# Patient Record
Sex: Male | Born: 1965
Health system: Southern US, Community
[De-identification: ages and names within clinical notes are randomized; demographics above are authoritative.]

## PROBLEM LIST (undated history)

## (undated) DIAGNOSIS — Z9049 Acquired absence of other specified parts of digestive tract: Secondary | ICD-10-CM

## (undated) DIAGNOSIS — J189 Pneumonia, unspecified organism: Secondary | ICD-10-CM

## (undated) DIAGNOSIS — K219 Gastro-esophageal reflux disease without esophagitis: Secondary | ICD-10-CM

## (undated) DIAGNOSIS — K760 Fatty (change of) liver, not elsewhere classified: Secondary | ICD-10-CM

## (undated) DIAGNOSIS — K8020 Calculus of gallbladder without cholecystitis without obstruction: Secondary | ICD-10-CM

## (undated) DIAGNOSIS — I10 Essential (primary) hypertension: Secondary | ICD-10-CM

## (undated) HISTORY — PX: APPENDECTOMY: SHX54

## (undated) HISTORY — PX: COLONOSCOPY: SHX174

## (undated) HISTORY — PX: WISDOM TOOTH EXTRACTION: SHX21

## (undated) HISTORY — PX: FINGER SURGERY: SHX640

---

## 2006-07-22 ENCOUNTER — Ambulatory Visit (HOSPITAL_COMMUNITY): Admission: EM | Admit: 2006-07-22 | Discharge: 2006-07-22 | Payer: Self-pay | Admitting: Emergency Medicine

## 2017-06-08 DIAGNOSIS — J0100 Acute maxillary sinusitis, unspecified: Secondary | ICD-10-CM | POA: Diagnosis not present

## 2017-06-08 DIAGNOSIS — J209 Acute bronchitis, unspecified: Secondary | ICD-10-CM | POA: Diagnosis not present

## 2017-11-28 DIAGNOSIS — K5732 Diverticulitis of large intestine without perforation or abscess without bleeding: Secondary | ICD-10-CM | POA: Diagnosis not present

## 2017-11-30 DIAGNOSIS — Z1211 Encounter for screening for malignant neoplasm of colon: Secondary | ICD-10-CM | POA: Diagnosis not present

## 2017-11-30 DIAGNOSIS — K5730 Diverticulosis of large intestine without perforation or abscess without bleeding: Secondary | ICD-10-CM | POA: Diagnosis not present

## 2017-11-30 DIAGNOSIS — K219 Gastro-esophageal reflux disease without esophagitis: Secondary | ICD-10-CM | POA: Diagnosis not present

## 2018-01-15 ENCOUNTER — Inpatient Hospital Stay (HOSPITAL_COMMUNITY)
Admission: EM | Admit: 2018-01-15 | Discharge: 2018-01-17 | DRG: 392 | Disposition: A | Discharge status: Home / Self Care | Payer: 59 | Attending: Internal Medicine | Admitting: Internal Medicine

## 2018-01-15 ENCOUNTER — Emergency Department (HOSPITAL_COMMUNITY): Payer: 59

## 2018-01-15 ENCOUNTER — Encounter (HOSPITAL_COMMUNITY): Payer: Self-pay | Admitting: Emergency Medicine

## 2018-01-15 ENCOUNTER — Other Ambulatory Visit: Payer: Self-pay

## 2018-01-15 DIAGNOSIS — Z9049 Acquired absence of other specified parts of digestive tract: Secondary | ICD-10-CM

## 2018-01-15 DIAGNOSIS — K760 Fatty (change of) liver, not elsewhere classified: Secondary | ICD-10-CM | POA: Diagnosis present

## 2018-01-15 DIAGNOSIS — R109 Unspecified abdominal pain: Secondary | ICD-10-CM | POA: Diagnosis not present

## 2018-01-15 DIAGNOSIS — J9811 Atelectasis: Secondary | ICD-10-CM | POA: Diagnosis not present

## 2018-01-15 DIAGNOSIS — K5732 Diverticulitis of large intestine without perforation or abscess without bleeding: Principal | ICD-10-CM | POA: Diagnosis present

## 2018-01-15 DIAGNOSIS — Z791 Long term (current) use of non-steroidal anti-inflammatories (NSAID): Secondary | ICD-10-CM | POA: Diagnosis not present

## 2018-01-15 DIAGNOSIS — D72829 Elevated white blood cell count, unspecified: Secondary | ICD-10-CM | POA: Diagnosis present

## 2018-01-15 DIAGNOSIS — K8020 Calculus of gallbladder without cholecystitis without obstruction: Secondary | ICD-10-CM | POA: Diagnosis present

## 2018-01-15 DIAGNOSIS — K5792 Diverticulitis of intestine, part unspecified, without perforation or abscess without bleeding: Secondary | ICD-10-CM

## 2018-01-15 DIAGNOSIS — R140 Abdominal distension (gaseous): Secondary | ICD-10-CM | POA: Diagnosis not present

## 2018-01-15 DIAGNOSIS — K56609 Unspecified intestinal obstruction, unspecified as to partial versus complete obstruction: Secondary | ICD-10-CM

## 2018-01-15 DIAGNOSIS — Z79899 Other long term (current) drug therapy: Secondary | ICD-10-CM

## 2018-01-15 DIAGNOSIS — R1110 Vomiting, unspecified: Secondary | ICD-10-CM | POA: Diagnosis not present

## 2018-01-15 DIAGNOSIS — E871 Hypo-osmolality and hyponatremia: Secondary | ICD-10-CM | POA: Diagnosis not present

## 2018-01-15 HISTORY — DX: Acquired absence of other specified parts of digestive tract: Z90.49

## 2018-01-15 LAB — CBC
HCT: 46.9 % (ref 39.0–52.0)
Hemoglobin: 15.8 g/dL (ref 13.0–17.0)
MCH: 30.6 pg (ref 26.0–34.0)
MCHC: 33.7 g/dL (ref 30.0–36.0)
MCV: 90.9 fL (ref 78.0–100.0)
Platelets: 283 10*3/uL (ref 150–400)
RBC: 5.16 MIL/uL (ref 4.22–5.81)
RDW: 13.3 % (ref 11.5–15.5)
WBC: 17.6 10*3/uL — AB (ref 4.0–10.5)

## 2018-01-15 LAB — URINALYSIS, ROUTINE W REFLEX MICROSCOPIC
BACTERIA UA: NONE SEEN
Bilirubin Urine: NEGATIVE
Glucose, UA: NEGATIVE mg/dL
KETONES UR: 20 mg/dL — AB
LEUKOCYTES UA: NEGATIVE
Nitrite: NEGATIVE
PH: 6 (ref 5.0–8.0)
Protein, ur: NEGATIVE mg/dL
Specific Gravity, Urine: >1.046 — ABNORMAL HIGH (ref 1.005–1.030)

## 2018-01-15 LAB — COMPREHENSIVE METABOLIC PANEL
ALBUMIN: 4 g/dL (ref 3.5–5.0)
ALT: 28 U/L (ref 17–63)
AST: 18 U/L (ref 15–41)
Alkaline Phosphatase: 54 U/L (ref 38–126)
Anion gap: 13 (ref 5–15)
BUN: 21 mg/dL — AB (ref 6–20)
CHLORIDE: 100 mmol/L — AB (ref 101–111)
CO2: 20 mmol/L — AB (ref 22–32)
Calcium: 9.2 mg/dL (ref 8.9–10.3)
Creatinine, Ser: 1.39 mg/dL — ABNORMAL HIGH (ref 0.61–1.24)
GFR calc Af Amer: >60 mL/min (ref >60)
GFR calc non Af Amer: 57 mL/min — ABNORMAL LOW (ref >60)
GLUCOSE: 108 mg/dL — AB (ref 65–99)
Potassium: 4.1 mmol/L (ref 3.5–5.1)
SODIUM: 133 mmol/L — AB (ref 135–145)
Total Bilirubin: 1.3 mg/dL — ABNORMAL HIGH (ref 0.3–1.2)
Total Protein: 7.5 g/dL (ref 6.5–8.1)

## 2018-01-15 LAB — LIPASE, BLOOD: LIPASE: 24 U/L (ref 11–51)

## 2018-01-15 MED ORDER — SODIUM CHLORIDE 0.9 % IV SOLN
INTRAVENOUS | Status: DC
  Administered 2018-01-15: 100 mL/h via INTRAVENOUS
  Administered 2018-01-15: 14:00:00 via INTRAVENOUS
  Administered 2018-01-16: 100 mL/h via INTRAVENOUS
  Administered 2018-01-17: 02:00:00 via INTRAVENOUS

## 2018-01-15 MED ORDER — IOPAMIDOL (ISOVUE-300) INJECTION 61%
100.0000 mL | Freq: Once | INTRAVENOUS | Status: AC | PRN
  Administered 2018-01-15: 100 mL via INTRAVENOUS

## 2018-01-15 MED ORDER — SODIUM CHLORIDE 0.9 % IV BOLUS (SEPSIS)
1000.0000 mL | Freq: Once | INTRAVENOUS | Status: AC
  Administered 2018-01-15: 1000 mL via INTRAVENOUS

## 2018-01-15 MED ORDER — MORPHINE SULFATE (PF) 4 MG/ML IV SOLN
4.0000 mg | Freq: Once | INTRAVENOUS | Status: AC
  Administered 2018-01-15: 4 mg via INTRAVENOUS
  Filled 2018-01-15: qty 1

## 2018-01-15 MED ORDER — ENOXAPARIN SODIUM 40 MG/0.4ML ~~LOC~~ SOLN
40.0000 mg | SUBCUTANEOUS | Status: DC
  Administered 2018-01-15 – 2018-01-16 (×2): 40 mg via SUBCUTANEOUS
  Filled 2018-01-15 (×2): qty 0.4

## 2018-01-15 MED ORDER — CIPROFLOXACIN IN D5W 400 MG/200ML IV SOLN
400.0000 mg | Freq: Once | INTRAVENOUS | Status: AC
  Administered 2018-01-15: 400 mg via INTRAVENOUS
  Filled 2018-01-15: qty 200

## 2018-01-15 MED ORDER — ONDANSETRON HCL 4 MG/2ML IJ SOLN
4.0000 mg | Freq: Three times a day (TID) | INTRAMUSCULAR | Status: DC | PRN
  Administered 2018-01-16: 4 mg via INTRAVENOUS
  Filled 2018-01-15: qty 2

## 2018-01-15 MED ORDER — IOPAMIDOL (ISOVUE-300) INJECTION 61%
INTRAVENOUS | Status: AC
  Filled 2018-01-15: qty 100

## 2018-01-15 MED ORDER — ACETAMINOPHEN 325 MG PO TABS
650.0000 mg | ORAL_TABLET | ORAL | Status: DC | PRN
  Administered 2018-01-15 – 2018-01-17 (×3): 650 mg via ORAL
  Filled 2018-01-15 (×3): qty 2

## 2018-01-15 MED ORDER — ONDANSETRON HCL 4 MG/2ML IJ SOLN
4.0000 mg | Freq: Once | INTRAMUSCULAR | Status: AC
  Administered 2018-01-15: 4 mg via INTRAVENOUS
  Filled 2018-01-15: qty 2

## 2018-01-15 MED ORDER — METRONIDAZOLE IN NACL 5-0.79 MG/ML-% IV SOLN
500.0000 mg | Freq: Three times a day (TID) | INTRAVENOUS | Status: DC
  Administered 2018-01-15 – 2018-01-17 (×6): 500 mg via INTRAVENOUS
  Filled 2018-01-15 (×6): qty 100

## 2018-01-15 MED ORDER — LIP MEDEX EX OINT
TOPICAL_OINTMENT | CUTANEOUS | Status: AC
  Filled 2018-01-15: qty 7

## 2018-01-15 MED ORDER — CIPROFLOXACIN IN D5W 400 MG/200ML IV SOLN
400.0000 mg | Freq: Two times a day (BID) | INTRAVENOUS | Status: DC
  Administered 2018-01-15 – 2018-01-17 (×4): 400 mg via INTRAVENOUS
  Filled 2018-01-15 (×4): qty 200

## 2018-01-15 MED ORDER — ORAL CARE MOUTH RINSE
15.0000 mL | Freq: Two times a day (BID) | OROMUCOSAL | Status: DC
  Administered 2018-01-15 – 2018-01-16 (×2): 15 mL via OROMUCOSAL

## 2018-01-15 MED ORDER — MORPHINE SULFATE (PF) 2 MG/ML IV SOLN
1.0000 mg | INTRAVENOUS | Status: DC | PRN
  Administered 2018-01-15 (×2): 2 mg via INTRAVENOUS
  Filled 2018-01-15 (×2): qty 1

## 2018-01-15 MED ORDER — METRONIDAZOLE IN NACL 5-0.79 MG/ML-% IV SOLN
500.0000 mg | Freq: Once | INTRAVENOUS | Status: AC
  Administered 2018-01-15: 500 mg via INTRAVENOUS
  Filled 2018-01-15: qty 100

## 2018-01-15 NOTE — H&P (Signed)
History and Physical    Henry Paul ZOX:[id number] DOB: 12-Sep-1966 DOA: 01/15/2018  PCP: Patient, No Pcp Per  Patient coming from: Home  I have personally briefly reviewed patient's old medical records in [facility]  Chief Complaint: lower abd pain.   HPI: Henry Paul is a 52 y.o. male with medical history significant of diverticulitis every year, diverticulosis, s/p appendectomy 10 years ago, comes in today for worsening lower abdominal pain, more in the left lower quadrant, associated with nausea, vomiting and diarrhea,. Pt was seen by Dr Loreta Ave yesterday and was given antibiotics for diverticulitis but his symptoms worsened over night and came to ED for further evaluation.  He reports subjective fevers and chills . But denies chest pain , sob, cough, dysuria, headache, dizziness , sensory deficits, syncope.  Lab work in ED REVEALED mild hyponatremia, creatinine os 1.39, BUN OF 21, BICARB OF 20,  total bili of 1.3 and wbc count of 17.6. CT abd and pelvis showed Findings consistent with acute sigmoid diverticulitis. No extraluminal bowel gas. No well-defined abscess. Mild partial small bowel obstruction secondary to the left lower quadrant inflammatory process.  Review of Systems: As per HPI otherwise 10 point review of systems negative.    History reviewed. No pertinent past medical history.  History reviewed. No pertinent surgical history.   reports that  has never smoked. he has never used smokeless tobacco. He reports that he does not drink alcohol or use drugs.  No Known Allergies  Family history reviewed and not pertinent.   Prior to Admission medications   Medication Sig Start Date End Date Taking? Authorizing Provider  acetaminophen (TYLENOL) 500 MG tablet Take 1,000 mg by mouth every 6 (six) hours as needed for headache.   Yes [provider]  ciprofloxacin (CIPRO) 500 MG tablet Take 500 mg by mouth every 12 (twelve) hours. 01/14/18  Yes [provider]  ibuprofen (ADVIL,MOTRIN) 200 MG tablet Take 200 mg by mouth every 6 (six) hours as needed for headache.   Yes [provider]  metroNIDAZOLE (FLAGYL) 250 MG tablet Take 250 mg by mouth 3 (three) times daily. 01/14/18  Yes [provider]    Physical Exam: Vitals:   01/15/18 0750  BP: (!) 143/100  Pulse: (!) 120  Resp: 20  Temp: 98.2 F (36.8 C)  TempSrc: Oral  SpO2: 100%    Constitutional: NAD, calm, comfortable Vitals:   01/15/18 0750  BP: (!) 143/100  Pulse: (!) 120  Resp: 20  Temp: 98.2 F (36.8 C)  TempSrc: Oral  SpO2: 100%   Eyes: PERRL, lids and conjunctivae normal ENMT: Mucous membranes are moist. Posterior pharynx clear of any exudate or lesions.Normal dentition.  Neck: normal, supple, no masses, no thyromegaly Respiratory: clear to auscultation bilaterally, no wheezing, no crackles. Normal respiratory effort. No accessory muscle use.  Cardiovascular: Regular rate and rhythm, no murmurs / rubs / gallops. No extremity edema.  Abdomen: soft, tender in the left lower quadrant, bowel sounds heard.  Musculoskeletal: no clubbing / cyanosis. No joint deformity upper and lower extremities. Good ROM, no contractures. Normal muscle tone.  Skin: no rashes, lesions, ulcers. No induration Neurologic: CN 2-12 grossly intact. Sensation intact, DTR normal. Strength 5/5 in all 4.  Psychiatric: Normal judgment and insight. Alert and oriented x 3. Normal mood.    Labs on Admission: I have personally reviewed following labs and imaging studies  CBC: Recent Labs  Lab 01/15/18 0817  WBC 17.6*  HGB 15.8  HCT  46.9  MCV 90.9  PLT 283   Basic Metabolic Panel: Recent Labs  Lab 01/15/18 0817  NA 133*  K 4.1  CL 100*  CO2 20*  GLUCOSE 108*  BUN 21*  CREATININE 1.39*  CALCIUM 9.2   GFR: CrCl cannot be calculated (Unknown ideal weight.). Liver Function Tests: Recent Labs  Lab 01/15/18 0817  AST 18  ALT 28  ALKPHOS 54  BILITOT 1.3*    PROT 7.5  ALBUMIN 4.0   Recent Labs  Lab 01/15/18 0817  LIPASE 24   No results for input(s): AMMONIA in the last 168 hours. Coagulation Profile: No results for input(s): INR, PROTIME in the last 168 hours. Cardiac Enzymes: No results for input(s): CKTOTAL, CKMB, CKMBINDEX, TROPONINI in the last 168 hours. BNP (last 3 results) No results for input(s): PROBNP in the last 8760 hours. HbA1C: No results for input(s): HGBA1C in the last 72 hours. CBG: No results for input(s): GLUCAP in the last 168 hours. Lipid Profile: No results for input(s): CHOL, HDL, LDLCALC, TRIG, CHOLHDL, LDLDIRECT in the last 72 hours. Thyroid Function Tests: No results for input(s): TSH, T4TOTAL, FREET4, T3FREE, THYROIDAB in the last 72 hours. Anemia Panel: No results for input(s): VITAMINB12, FOLATE, FERRITIN, TIBC, IRON, RETICCTPCT in the last 72 hours. Urine analysis:    Component Value Date/Time   COLORURINE YELLOW 01/15/2018 1021   APPEARANCEUR CLEAR 01/15/2018 1021   LABSPEC >1.046 (H) 01/15/2018 1021   PHURINE 6.0 01/15/2018 1021   GLUCOSEU NEGATIVE 01/15/2018 1021   HGBUR SMALL (A) 01/15/2018 1021   BILIRUBINUR NEGATIVE 01/15/2018 1021   KETONESUR 20 (A) 01/15/2018 1021   PROTEINUR NEGATIVE 01/15/2018 1021   NITRITE NEGATIVE 01/15/2018 1021   LEUKOCYTESUR NEGATIVE 01/15/2018 1021    Radiological Exams on Admission: Ct Abdomen Pelvis W Contrast  Result Date: 01/15/2018 CLINICAL DATA:  Abdominal pain. Nausea. Vomiting. Symptoms for 2 day. EXAM: CT ABDOMEN AND PELVIS WITH CONTRAST TECHNIQUE: Multidetector CT imaging of the abdomen and pelvis was performed using the standard protocol following bolus administration of intravenous contrast. CONTRAST:  100mL ISOVUE-300 IOPAMIDOL (ISOVUE-300) INJECTION 61% COMPARISON:  None. FINDINGS: Lower chest: Patchy and linear opacities in the dependent lower lungs bilaterally are nonspecific. Hepatobiliary: Diffuse hepatic steatosis. A calcified gallstone is  present. It measures 12 mm in caliber. Pancreas: Unremarkable Spleen: Unremarkable Adrenals/Urinary Tract: Simple cysts in both kidneys. Adrenal glands are within normal limits. Bladder is decompressed. Stomach/Bowel: There is wall thickening and inflammatory change involving the region of the sigmoid colon. There is stranding in the adjacent fat. Diverticulosis of the descending and sigmoid colon is present. There is secondary inflammation of lower abdominal small bowel loops. There is ill-defined fluid density between small bowel loops in the left side of the abdomen on image 50 of series 2, but there is no extraluminal bowel gas or well-defined abscess. Post appendectomy staples. Mildly prominent mid and upper small bowel loops measure up to 3.1 cm in caliber. There may be an element of partial small bowel obstruction secondary to the left lower quadrant inflammatory process. Stomach and duodenum are within normal limits. Vascular/Lymphatic: No evidence of aortic aneurysm. No abnormal retroperitoneal adenopathy. Reproductive: Borderline prostate enlargement. Other: Noncontributory Musculoskeletal: No vertebral compression deformity. Mild degenerative disc disease in the lower lumbar spine. IMPRESSION: Findings consistent with acute sigmoid diverticulitis. No extraluminal bowel gas. No well-defined abscess. Mild partial small bowel obstruction secondary to the left lower quadrant inflammatory process. Cholelithiasis. Diffuse hepatic steatosis. Bibasilar atelectasis favored over patchy airspace disease. Electronically Signed  By: Jolaine Click M.D.   On: 01/15/2018 10:02    EKG: not done.   Assessment/Plan Active Problems:   Acute diverticulitis   Acute sigmoid diverticulitis  With partial SBO - admit for IV antibiotics, IV pain control, IV fluids. IV anti emetics . NPO with ice chips.  - please call surgery if symptoms dont improve in the next 24 hours.  - afebrile and leukocytosis of 17,600.  -  follows up with Dr Loreta Ave as outpatient.   DVT prophylaxis: lovenox.  Code Status: full code.  Family Communication: none at bedside.  Disposition Plan: pending resolution of the lower abd pain. Consults called: none.  Admission status: inpatient/ medsurg.    Kathlen Mody MD [facility] Pager [phone]  If 7PM-7AM, please contact night-coverage [email or web address] Password [email or web address]  01/15/2018, 11:06 AM

## 2018-01-15 NOTE — ED Provider Notes (Addendum)
[facility]-EMERGENCY DEPT Provider Note   CSN: [id number] Arrival date & time: 01/15/18  0736     History   Chief Complaint Chief Complaint  Patient presents with  . Abdominal Pain  . Nausea  . Emesis    HPI Henry Paul is a 52 y.o. male.  Generalized abdominal pain since Thursday now settling in the left lower quadrant greater than right lower quadrant.  Patient has a history of diverticulitis and appendectomy.  Decreased oral intake.  No dysuria, hematuria.  His gastroenterologist prescribed Flagyl and Cipro recently.  Severity of pain is moderate.  Last bowel movement within the past 24 hours.  No blood in the stool.  Severity of symptoms is moderate.  Palpation makes pain worse.      History reviewed. No pertinent past medical history.  There are no active problems to display for this patient.   History reviewed. No pertinent surgical history.     Home Medications    Prior to Admission medications   Medication Sig Start Date End Date Taking? Authorizing Provider  acetaminophen (TYLENOL) 500 MG tablet Take 1,000 mg by mouth every 6 (six) hours as needed for headache.   Yes [provider]  ciprofloxacin (CIPRO) 500 MG tablet Take 500 mg by mouth every 12 (twelve) hours. 01/14/18  Yes [provider]  ibuprofen (ADVIL,MOTRIN) 200 MG tablet Take 200 mg by mouth every 6 (six) hours as needed for headache.   Yes [provider]  metroNIDAZOLE (FLAGYL) 250 MG tablet Take 250 mg by mouth 3 (three) times daily. 01/14/18  Yes [provider]    Family History No family history on file.  Social History Social History   Tobacco Use  . Smoking status: Never Smoker  . Smokeless tobacco: Never Used  Substance Use Topics  . Alcohol use: No    Frequency: Never  . Drug use: No     Allergies   Patient has no known allergies.   Review of Systems Review of Systems  All other systems reviewed and are  negative.    Physical Exam Updated Vital Signs BP (!) 143/100 (BP Location: Right Arm)   Pulse (!) 120   Temp 98.2 F (36.8 C) (Oral)   Resp 20   SpO2 100%   Physical Exam  Constitutional: He is oriented to person, place, and time. He appears well-developed and well-nourished.  HENT:  Head: Normocephalic and atraumatic.  Eyes: Conjunctivae are normal.  Neck: Neck supple.  Cardiovascular: Normal rate and regular rhythm.  Pulmonary/Chest: Effort normal and breath sounds normal.  Abdominal: Soft. Bowel sounds are normal.  Left lower quadrant greater than right lower quadrant tenderness  Musculoskeletal: Normal range of motion.  Neurological: He is alert and oriented to person, place, and time.  Skin: Skin is warm and dry.  Psychiatric: He has a normal mood and affect. His behavior is normal.  Nursing note and vitals reviewed.    ED Treatments / Results  Labs (all labs ordered are listed, but only abnormal results are displayed) Labs Reviewed  COMPREHENSIVE METABOLIC PANEL - Abnormal; Notable for the following components:      Result Value   Sodium 133 (*)    Chloride 100 (*)    CO2 20 (*)    Glucose, Bld 108 (*)    BUN 21 (*)    Creatinine, Ser 1.39 (*)    Total Bilirubin 1.3 (*)    GFR calc non Af Amer 57 (*)  All other components within normal limits  CBC - Abnormal; Notable for the following components:   WBC 17.6 (*)    All other components within normal limits  LIPASE, BLOOD  URINALYSIS, ROUTINE W REFLEX MICROSCOPIC    EKG  EKG Interpretation None       Radiology Ct Abdomen Pelvis W Contrast  Result Date: 01/15/2018 CLINICAL DATA:  Abdominal pain. Nausea. Vomiting. Symptoms for 2 day. EXAM: CT ABDOMEN AND PELVIS WITH CONTRAST TECHNIQUE: Multidetector CT imaging of the abdomen and pelvis was performed using the standard protocol following bolus administration of intravenous contrast. CONTRAST:  100mL ISOVUE-300 IOPAMIDOL (ISOVUE-300) INJECTION 61%  COMPARISON:  None. FINDINGS: Lower chest: Patchy and linear opacities in the dependent lower lungs bilaterally are nonspecific. Hepatobiliary: Diffuse hepatic steatosis. A calcified gallstone is present. It measures 12 mm in caliber. Pancreas: Unremarkable Spleen: Unremarkable Adrenals/Urinary Tract: Simple cysts in both kidneys. Adrenal glands are within normal limits. Bladder is decompressed. Stomach/Bowel: There is wall thickening and inflammatory change involving the region of the sigmoid colon. There is stranding in the adjacent fat. Diverticulosis of the descending and sigmoid colon is present. There is secondary inflammation of lower abdominal small bowel loops. There is ill-defined fluid density between small bowel loops in the left side of the abdomen on image 50 of series 2, but there is no extraluminal bowel gas or well-defined abscess. Post appendectomy staples. Mildly prominent mid and upper small bowel loops measure up to 3.1 cm in caliber. There may be an element of partial small bowel obstruction secondary to the left lower quadrant inflammatory process. Stomach and duodenum are within normal limits. Vascular/Lymphatic: No evidence of aortic aneurysm. No abnormal retroperitoneal adenopathy. Reproductive: Borderline prostate enlargement. Other: Noncontributory Musculoskeletal: No vertebral compression deformity. Mild degenerative disc disease in the lower lumbar spine. IMPRESSION: Findings consistent with acute sigmoid diverticulitis. No extraluminal bowel gas. No well-defined abscess. Mild partial small bowel obstruction secondary to the left lower quadrant inflammatory process. Cholelithiasis. Diffuse hepatic steatosis. Bibasilar atelectasis favored over patchy airspace disease. Electronically Signed   By: Jolaine ClickArthur  Hoss M.D.   On: 01/15/2018 10:02    Procedures Procedures (including critical care time)  Medications Ordered in ED Medications  iopamidol (ISOVUE-300) 61 % injection (not  administered)  sodium chloride 0.9 % bolus 1,000 mL (not administered)  ciprofloxacin (CIPRO) IVPB 400 mg (not administered)  metroNIDAZOLE (FLAGYL) IVPB 500 mg (not administered)  sodium chloride 0.9 % bolus 1,000 mL (1,000 mLs Intravenous New Bag/Given 01/15/18 0833)  ondansetron (ZOFRAN) injection 4 mg (4 mg Intravenous Given 01/15/18 0833)  morphine 4 MG/ML injection 4 mg (4 mg Intravenous Given 01/15/18 0833)  iopamidol (ISOVUE-300) 61 % injection 100 mL (100 mLs Intravenous Contrast Given 01/15/18 0944)     Initial Impression / Assessment and Plan / ED Course  I have reviewed the triage vital signs and the nursing notes.  Pertinent labs & imaging results that were available during my care of the patient were reviewed by me and considered in my medical decision making (see chart for details).     Patient presents with lower abdominal pain left greater than right.  White count 17 K.  Rx IV fluid, pain meds.  CT abdomen pelvis pending.   CT scan reveals acute sigmoid diverticulitis and mild partial small bowel obstruction.  IV Cipro, IV Flagyl.  Admit to general medicine. Final Clinical Impressions(s) / ED Diagnoses   Final diagnoses:  Diverticulitis  SBO (small bowel obstruction) [facility](HCC)    ED Discharge Orders  None       Donnetta Hutching, MD 01/15/18 0981    Donnetta Hutching, MD 01/15/18 [phone]

## 2018-01-15 NOTE — ED Notes (Signed)
I have just called report to Armando ReichertMarian, RN on [address] and will transport shortly.

## 2018-01-15 NOTE — ED Triage Notes (Signed)
Patient here from home with complaints of abdominal pain, nausea, vomiting x2 days. Saw GI, diagnosed with diverticulitis, prescribed Flagyl and Cipro no relief.

## 2018-01-15 NOTE — ED Notes (Signed)
ED TO INPATIENT HANDOFF REPORT  Name/Age/Gender Henry Paul 52 y.o. male  Code Status Code Status History    This patient does not have a recorded code status. Please follow your organizational policy for patients in this situation.      Home/SNF/Other Home  Chief Complaint stomach hurts  Level of Care/Admitting Diagnosis ED Disposition    ED Disposition Condition [facility] Area: [place] [[id number]]  Level of Care: Med-Surg [16]  Diagnosis: Acute diverticulitis [[id number]]  Admitting Physician: Hosie Poisson [[id number]]  Attending Physician: Hosie Poisson [[id number]]  Estimated length of stay: past midnight tomorrow  Certification:: I certify this patient will need inpatient services for at least 2 midnights  PT Class (Do Not Modify): Inpatient [101]  PT Acc Code (Do Not Modify): Private [1]       Medical History History reviewed. No pertinent past medical history.  Allergies No Known Allergies  IV Location/Drains/Wounds Patient Lines/Drains/Airways Status   Active Line/Drains/Airways    Name:   Placement date:   Placement time:   Site:   Days:   Peripheral IV 01/15/18 Left;Lateral;Distal Forearm   01/15/18    0816    Forearm   less than 1          Labs/Imaging Results for orders placed or performed during the hospital encounter of 01/15/18 (from the past 48 hour(s))  Lipase, blood     Status: None   Collection Time: 01/15/18  8:17 AM  Result Value Ref Range   Lipase 24 11 - 51 U/L    Comment: Performed at [facility], [place] [address], [place], [place] [address]  Comprehensive metabolic panel     Status: Abnormal   Collection Time: 01/15/18  8:17 AM  Result Value Ref Range   Sodium 133 (L) 135 - 145 mmol/L   Potassium 4.1 3.5 - 5.1 mmol/L   Chloride 100 (L) 101 - 111 mmol/L   CO2 20 (L) 22 - 32 mmol/L   Glucose, Bld 108 (H) 65 - 99 mg/dL   BUN 21 (H) 6 - 20 mg/dL   Creatinine, Ser 1.39 (H) 0.61 - 1.24  mg/dL   Calcium 9.2 8.9 - 10.3 mg/dL   Total Protein 7.5 6.5 - 8.1 g/dL   Albumin 4.0 3.5 - 5.0 g/dL   AST 18 15 - 41 U/L   ALT 28 17 - 63 U/L   Alkaline Phosphatase 54 38 - 126 U/L   Total Bilirubin 1.3 (H) 0.3 - 1.2 mg/dL   GFR calc non Af Amer 57 (L) >60 mL/min   GFR calc Af Amer >60 >60 mL/min    Comment: (NOTE) The eGFR has been calculated using the CKD EPI equation. This calculation has not been validated in all clinical situations. eGFR's persistently <60 mL/min signify possible Chronic Kidney Disease.    Anion gap 13 5 - 15    Comment: Performed at [facility], [place] [address], [place], [place] [address]  CBC     Status: Abnormal   Collection Time: 01/15/18  8:17 AM  Result Value Ref Range   WBC 17.6 (H) 4.0 - 10.5 K/uL   RBC 5.16 4.22 - 5.81 MIL/uL   Hemoglobin 15.8 13.0 - 17.0 g/dL   HCT 46.9 39.0 - 52.0 %   MCV 90.9 78.0 - 100.0 fL   MCH 30.6 26.0 - 34.0 pg   MCHC 33.7 30.0 - 36.0 g/dL   RDW 13.3 11.5 - 15.5 %  Platelets 283 150 - 400 K/uL    Comment: Performed at [facility], [place] [address] [place], [place] [address]   Ct Abdomen Pelvis W Contrast  Result Date: 01/15/2018 CLINICAL DATA:  Abdominal pain. Nausea. Vomiting. Symptoms for 2 day. EXAM: CT ABDOMEN AND PELVIS WITH CONTRAST TECHNIQUE: Multidetector CT imaging of the abdomen and pelvis was performed using the standard protocol following bolus administration of intravenous contrast. CONTRAST:  14m ISOVUE-300 IOPAMIDOL (ISOVUE-300) INJECTION 61% COMPARISON:  None. FINDINGS: Lower chest: Patchy and linear opacities in the dependent lower lungs bilaterally are nonspecific. Hepatobiliary: Diffuse hepatic steatosis. A calcified gallstone is present. It measures 12 mm in caliber. Pancreas: Unremarkable Spleen: Unremarkable Adrenals/Urinary Tract: Simple cysts in both kidneys. Adrenal glands are within normal limits. Bladder is decompressed. Stomach/Bowel: There is wall  thickening and inflammatory change involving the region of the sigmoid colon. There is stranding in the adjacent fat. Diverticulosis of the descending and sigmoid colon is present. There is secondary inflammation of lower abdominal small bowel loops. There is ill-defined fluid density between small bowel loops in the left side of the abdomen on image 50 of series 2, but there is no extraluminal bowel gas or well-defined abscess. Post appendectomy staples. Mildly prominent mid and upper small bowel loops measure up to 3.1 cm in caliber. There may be an element of partial small bowel obstruction secondary to the left lower quadrant inflammatory process. Stomach and duodenum are within normal limits. Vascular/Lymphatic: No evidence of aortic aneurysm. No abnormal retroperitoneal adenopathy. Reproductive: Borderline prostate enlargement. Other: Noncontributory Musculoskeletal: No vertebral compression deformity. Mild degenerative disc disease in the lower lumbar spine. IMPRESSION: Findings consistent with acute sigmoid diverticulitis. No extraluminal bowel gas. No well-defined abscess. Mild partial small bowel obstruction secondary to the left lower quadrant inflammatory process. Cholelithiasis. Diffuse hepatic steatosis. Bibasilar atelectasis favored over patchy airspace disease. Electronically Signed   By: AMarybelle KillingsM.D.   On: 01/15/2018 10:02    Pending Labs Unresulted Labs (From admission, onward)   Start     Ordered   01/15/18 0754  Urinalysis, Routine w reflex microscopic  STAT,   STAT     01/15/18 0754      Vitals/Pain Today's Vitals   01/15/18 0750 01/15/18 0830 01/15/18 0900  BP: (!) 143/100    Pulse: (!) 120    Resp: 20    Temp: 98.2 F (36.8 C)    TempSrc: Oral    SpO2: 100%    PainSc: 10-Worst pain ever 6  0-No pain    Isolation Precautions No active isolations  Medications Medications  iopamidol (ISOVUE-300) 61 % injection (not administered)  ciprofloxacin (CIPRO) IVPB 400  mg (not administered)  metroNIDAZOLE (FLAGYL) IVPB 500 mg (500 mg Intravenous New Bag/Given 01/15/18 1021)  sodium chloride 0.9 % bolus 1,000 mL (1,000 mLs Intravenous New Bag/Given 01/15/18 0833)  ondansetron (ZOFRAN) injection 4 mg (4 mg Intravenous Given 01/15/18 0833)  morphine 4 MG/ML injection 4 mg (4 mg Intravenous Given 01/15/18 0833)  iopamidol (ISOVUE-300) 61 % injection 100 mL (100 mLs Intravenous Contrast Given 01/15/18 0944)  sodium chloride 0.9 % bolus 1,000 mL (1,000 mLs Intravenous New Bag/Given 01/15/18 1021)    Mobility walks

## 2018-01-16 ENCOUNTER — Inpatient Hospital Stay (HOSPITAL_COMMUNITY): Payer: 59

## 2018-01-16 LAB — HIV ANTIBODY (ROUTINE TESTING W REFLEX): HIV Screen 4th Generation wRfx: NONREACTIVE

## 2018-01-16 NOTE — Progress Notes (Signed)
@IPLOG @        PROGRESS NOTE                                                                                                                                                                                                             Patient Demographics:    Henry Paul, is a 52 y.o. male, DOB - 10-29-1966, ONG:[id number]  Admit date - 01/15/2018   Admitting Physician Kathlen Mody, MD  Outpatient Primary MD for the patient is Patient, No Pcp Per  LOS - 1  Chief Complaint  Patient presents with  . Abdominal Pain  . Nausea  . Emesis       Brief Narrative  Henry Paul is a 52 y.o. male with medical history significant of diverticulitis every year, diverticulosis, s/p appendectomy 10 years ago, comes in today for worsening lower abdominal pain, more in the left lower quadrant, associated with nausea, vomiting and diarrhea,. Pt was found to have acute sigmoid diverticulitis which seems to be a regular problem for him.   Subjective:    Henry Paul today has, No headache, No chest pain, mild lower abdominal pain - No Nausea, No new weakness tingling or numbness, No Cough - SOB.     Assessment  & Plan :   1.  Acute sigmoid diverticulitis.  The apparent.  Is being treated conservatively, no signs of abscess, certainly no obstruction at this time, continue bowel rest, IV fluids, IV Cipro Flagyl.  Clinically much better.  Likely discharge in the next 1-2 days with outpatient follow-up with Dr. Loreta Ave his gastroenterologist.    Diet : Diet NPO time specified Except for: Ice Chips, Sips with Meds, Other (See Comments)    Family Communication  :  None  Code Status :  Full  Disposition Plan  :  Likely home in 1-2 days  Consults  :  None  Procedures  :    CT -  Findings consistent with acute sigmoid diverticulitis. No extraluminal bowel gas. No well-defined abscess. Mild partial small bowel obstruction secondary to the left lower quadrant inflammatory process. Cholelithiasis.  Diffuse hepatic steatosis. Bibasilar atelectasis favored over patchy airspace disease.  DVT Prophylaxis  :  Lovenox   Lab Results  Component Value Date   PLT 283 01/15/2018    Inpatient Medications  Scheduled Meds: . enoxaparin (LOVENOX) injection  40 mg Subcutaneous Q24H  . mouth rinse  15 mL Mouth Rinse BID   Continuous Infusions: . sodium chloride 100 mL/hr (01/16/18 0134)  . ciprofloxacin Stopped (01/15/18 2220)  .  metronidazole Stopped (01/16/18 0223)   PRN Meds:.acetaminophen, morphine injection, ondansetron (ZOFRAN) IV  Antibiotics  :    Anti-infectives (From admission, onward)   Start     Dose/Rate Route Frequency Ordered Stop   01/15/18 2200  ciprofloxacin (CIPRO) IVPB 400 mg     400 mg 200 mL/hr over 60 Minutes Intravenous Every 12 hours 01/15/18 1143     01/15/18 1800  metroNIDAZOLE (FLAGYL) IVPB 500 mg     500 mg 100 mL/hr over 60 Minutes Intravenous Every 8 hours 01/15/18 1143     01/15/18 1015  ciprofloxacin (CIPRO) IVPB 400 mg     400 mg 200 mL/hr over 60 Minutes Intravenous  Once 01/15/18 1007 01/15/18 1303   01/15/18 1015  metroNIDAZOLE (FLAGYL) IVPB 500 mg     500 mg 100 mL/hr over 60 Minutes Intravenous  Once 01/15/18 1007 01/15/18 1110         Objective:   Vitals:   01/15/18 1321 01/15/18 1347 01/15/18 2147 01/16/18 0546  BP:  (!) 131/91 123/84 134/82  Pulse:  97 89 87  Resp:  16 17 17   Temp:  (!) 100.4 F (38 C) 98.9 F (37.2 C) 98.4 F (36.9 C)  TempSrc:  Oral Oral Oral  SpO2:  97% 98% 98%  Weight: 84.5 kg (186 lb 4.6 oz)     Height: 5\' 9"  (1.753 m)       Wt Readings from Last 3 Encounters:  01/15/18 84.5 kg (186 lb 4.6 oz)     Intake/Output Summary (Last 24 hours) at 01/16/2018 0950 Last data filed at 01/16/2018 0600 Gross per 24 hour  Intake 2285 ml  Output 700 ml  Net 1585 ml     Physical Exam  Awake Alert, Oriented X 3, No new F.N deficits, Normal affect [place].AT,PERRAL Supple Neck,No JVD, No cervical lymphadenopathy  appriciated.  Symmetrical Chest wall movement, Good air movement bilaterally, CTAB RRR,No Gallops,Rubs or new Murmurs, No Parasternal Heave +ve B.Sounds, Abd Soft, mild lower quadrant tenderness, No organomegaly appriciated, No rebound - guarding or rigidity. No Cyanosis, Clubbing or edema, No new Rash or bruise       Data Review:    CBC Recent Labs  Lab 01/15/18 0817  WBC 17.6*  HGB 15.8  HCT 46.9  PLT 283  MCV 90.9  MCH 30.6  MCHC 33.7  RDW 13.3    Chemistries  Recent Labs  Lab 01/15/18 0817  NA 133*  K 4.1  CL 100*  CO2 20*  GLUCOSE 108*  BUN 21*  CREATININE 1.39*  CALCIUM 9.2  AST 18  ALT 28  ALKPHOS 54  BILITOT 1.3*   ------------------------------------------------------------------------------------------------------------------ No results for input(s): CHOL, HDL, LDLCALC, TRIG, CHOLHDL, LDLDIRECT in the last 72 hours.  No results found for: HGBA1C ------------------------------------------------------------------------------------------------------------------ No results for input(s): TSH, T4TOTAL, T3FREE, THYROIDAB in the last 72 hours.  Invalid input(s): FREET3 ------------------------------------------------------------------------------------------------------------------ No results for input(s): VITAMINB12, FOLATE, FERRITIN, TIBC, IRON, RETICCTPCT in the last 72 hours.  Coagulation profile No results for input(s): INR, PROTIME in the last 168 hours.  No results for input(s): DDIMER in the last 72 hours.  Cardiac Enzymes No results for input(s): CKMB, TROPONINI, MYOGLOBIN in the last 168 hours.  Invalid input(s): CK ------------------------------------------------------------------------------------------------------------------ No results found for: BNP  Micro Results No results found for this or any previous visit (from the past 240 hour(s)).  Radiology Reports Ct Abdomen Pelvis W Contrast  Result Date: 01/15/2018 CLINICAL DATA:   Abdominal pain. Nausea. Vomiting. Symptoms for 2 day.  EXAM: CT ABDOMEN AND PELVIS WITH CONTRAST TECHNIQUE: Multidetector CT imaging of the abdomen and pelvis was performed using the standard protocol following bolus administration of intravenous contrast. CONTRAST:  ISOVUE-300 IOPAMIDOL (ISOVUE-300) INJECTION 61% COMPARISON:  None. FINDINGS: Lower chest: Patchy and linear opacities in the dependent lower lungs bilaterally are nonspecific. Hepatobiliary: Diffuse hepatic steatosis. A calcified gallstone is present. It measures 12 mm in caliber. Pancreas: Unremarkable Spleen: Unremarkable Adrenals/Urinary Tract: Simple cysts in both kidneys. Adrenal glands are within normal limits. Bladder is decompressed. Stomach/Bowel: There is wall thickening and inflammatory change involving the region of the sigmoid colon. There is stranding in the adjacent fat. Diverticulosis of the descending and sigmoid colon is present. There is secondary inflammation of lower abdominal small bowel loops. There is ill-defined fluid density between small bowel loops in the left side of the abdomen on image 50 of series 2, but there is no extraluminal bowel gas or well-defined abscess. Post appendectomy staples. Mildly prominent mid and upper small bowel loops measure up to 3.1 cm in caliber. There may be an element of partial small bowel obstruction secondary to the left lower quadrant inflammatory process. Stomach and duodenum are within normal limits. Vascular/Lymphatic: No evidence of aortic aneurysm. No abnormal retroperitoneal adenopathy. Reproductive: Borderline prostate enlargement. Other: Noncontributory Musculoskeletal: No vertebral compression deformity. Mild degenerative disc disease in the lower lumbar spine. IMPRESSION: Findings consistent with acute sigmoid diverticulitis. No extraluminal bowel gas. No well-defined abscess. Mild partial small bowel obstruction secondary to the left lower quadrant inflammatory process.  Cholelithiasis. Diffuse hepatic steatosis. Bibasilar atelectasis favored over patchy airspace disease. Electronically Signed   By: Jolaine Click M.D.   On: 01/15/2018 10:02   Dg Abd Portable 1v  Result Date: 01/16/2018 CLINICAL DATA:  Patient with abdominal distension. EXAM: PORTABLE ABDOMEN - 1 VIEW COMPARISON:  CT abdomen pelvis 01/15/2018. FINDINGS: Gas is demonstrated within nondilated loops of large and small bowel within the abdomen. Supine evaluation limited for the detection of free intraperitoneal air. Osseous structures are unremarkable. IMPRESSION: Nonobstructed bowel gas pattern. Electronically Signed   By: Annia Belt M.D.   On: 01/16/2018 08:21    Time Spent in minutes  30   Susa Raring M.D on 01/16/2018 at 9:50 AM  Between 7am to 7pm - Pager - [phone] ( page via [email or web address], text pages only, please mention full 10 digit call back number). After 7pm go to [email or web address] - password [facility]

## 2018-01-17 ENCOUNTER — Encounter (HOSPITAL_COMMUNITY): Payer: Self-pay | Admitting: General Surgery

## 2018-01-17 DIAGNOSIS — K56609 Unspecified intestinal obstruction, unspecified as to partial versus complete obstruction: Secondary | ICD-10-CM

## 2018-01-17 LAB — BASIC METABOLIC PANEL
Anion gap: 9 (ref 5–15)
BUN: 16 mg/dL (ref 6–20)
CO2: 22 mmol/L (ref 22–32)
CREATININE: 1.02 mg/dL (ref 0.61–1.24)
Calcium: 8.4 mg/dL — ABNORMAL LOW (ref 8.9–10.3)
Chloride: 105 mmol/L (ref 101–111)
GFR calc Af Amer: >60 mL/min (ref >60)
Glucose, Bld: 89 mg/dL (ref 65–99)
POTASSIUM: 4.2 mmol/L (ref 3.5–5.1)
SODIUM: 136 mmol/L (ref 135–145)

## 2018-01-17 LAB — CBC
HCT: 41.5 % (ref 39.0–52.0)
Hemoglobin: 13.9 g/dL (ref 13.0–17.0)
MCH: 30.3 pg (ref 26.0–34.0)
MCHC: 33.5 g/dL (ref 30.0–36.0)
MCV: 90.6 fL (ref 78.0–100.0)
PLATELETS: 281 10*3/uL (ref 150–400)
RBC: 4.58 MIL/uL (ref 4.22–5.81)
RDW: 12.8 % (ref 11.5–15.5)
WBC: 7.3 10*3/uL (ref 4.0–10.5)

## 2018-01-17 LAB — MAGNESIUM: MAGNESIUM: 2.2 mg/dL (ref 1.7–2.4)

## 2018-01-17 MED ORDER — CIPROFLOXACIN HCL 500 MG PO TABS: 500.0000 mg | ORAL_TABLET | Freq: Two times a day (BID) | ORAL | 0 refills | Status: DC

## 2018-01-17 MED ORDER — METRONIDAZOLE 500 MG PO TABS: 500.0000 mg | ORAL_TABLET | Freq: Three times a day (TID) | ORAL | 0 refills | Status: DC

## 2018-01-17 NOTE — Discharge Summary (Signed)
Henry Paul ZOX:[id number] DOB: 14-Nov-1965 DOA: 01/15/2018  PCP: Patient, No Pcp Per  Admit date: 01/15/2018  Discharge date: 01/17/2018  Admitted From: Home  Disposition:  Home   Recommendations for Outpatient Follow-up:   Follow up with PCP in 1-2 weeks  PCP Please obtain BMP/CBC, 2 view CXR in 1week,  (see Discharge instructions)   PCP Please follow up on the following pending results: None   Home Health: None   Equipment/Devices: None  Consultations: None Discharge Condition: Stable   CODE STATUS: Full   Diet Recommendation: Soft diet for 2-3 days then advance to regular consistency as tolerated    Chief Complaint  Patient presents with  . Abdominal Pain  . Nausea  . Emesis     Brief history of present illness from the day of admission and additional interim summary      Henry Paul a 51 y.o.malewith medical history significant ofdiverticulitis every year, diverticulosis, s/p appendectomy 10 years ago, comes in today for worsening lower abdominal pain, more in the left lower quadrant, associated with nausea, vomiting and diarrhea,. Pt was found to have acute sigmoid diverticulitis which seems to be a regular problem for him.                                                                 Hospital Course    1.  Acute sigmoid diverticulitis with possible partial small bowel obstruction upon admission.    He was treated with conservative management, he was given bowel rest, IV fluids along with IV Cipro Flagyl, he showed remarkable improvement within 12 hours, yesterday he was switched to clear liquid diet and he continued to improve on that, this morning he is completely symptom-free, no pain fever or leukocytosis, he will be advanced to soft diet, given 7 more days of Cipro and 10 more  days of Flagyl and discharged home with outpatient follow-up with Dr. Loreta Ave GI who he was supposed to see this week.  Patient has had recurrent problems with diverticulitis and will require colonoscopy in the next 6-8 weeks.   Discharge diagnosis     Active Problems:   Acute diverticulitis   SBO (small bowel obstruction) (HCC)    Discharge instructions    Discharge Instructions    Activity as tolerated - No restrictions   Complete by:  As directed    Diet - low sodium heart healthy   Complete by:  As directed    Discharge instructions   Complete by:  As directed    Follow with Primary MD in 7 days   Get CBC, CMP checked  by Primary MD in 5-7 days    Activity: As tolerated with Full fall precautions use walker/cane & assistance as needed  Disposition Home    Diet:   Soft  diet for 2-3 days then advance to regular consistency as tolerated  For Heart failure patients - Check your Weight same time everyday, if you gain over 2 pounds, or you develop in leg swelling, experience more shortness of breath or chest pain, call your Primary MD immediately. Follow Cardiac Low Salt Diet and 1.5 lit/day fluid restriction.  Special Instructions: If you have smoked or chewed Tobacco  in the last 2 yrs please stop smoking, stop any regular Alcohol  and or any Recreational drug use.  On your next visit with your primary care physician please Get Medicines reviewed and adjusted.  Please request your Prim.MD to go over all Hospital Tests and Procedure/Radiological results at the follow up, please get all Hospital records sent to your Prim MD by signing hospital release before you go home.  If you experience worsening of your admission symptoms, develop shortness of breath, life threatening emergency, suicidal or homicidal thoughts you must seek medical attention immediately by calling 911 or calling your MD immediately  if symptoms less severe.  You Must read complete instructions/literature  along with all the possible adverse reactions/side effects for all the Medicines you take and that have been prescribed to you. Take any new Medicines after you have completely understood and accpet all the possible adverse reactions/side effects.   Increase activity slowly   Complete by:  As directed       Discharge Medications   Allergies as of 01/17/2018   No Known Allergies     Medication List    TAKE these medications   acetaminophen 500 MG tablet Commonly known as:  TYLENOL Take 1,000 mg by mouth every 6 (six) hours as needed for headache.   ciprofloxacin 500 MG tablet Commonly known as:  CIPRO Take 1 tablet (500 mg total) by mouth every 12 (twelve) hours.   ibuprofen 200 MG tablet Commonly known as:  ADVIL,MOTRIN Take 200 mg by mouth every 6 (six) hours as needed for headache.   metroNIDAZOLE 500 MG tablet Commonly known as:  FLAGYL Take 1 tablet (500 mg total) by mouth 3 (three) times daily. Do not drink any alcohol while you are on this medication What changed:    medication strength  how much to take  additional instructions       Follow-up Information    Charna ElizabethMann, Jyothi, MD. Schedule an appointment as soon as possible for a visit in 4 day(s).   Specialty:  Gastroenterology Contact information: [address], [place] [place] [address] [phone]           Major procedures and Radiology Reports - PLEASE review detailed and final reports thoroughly  -         Ct Abdomen Pelvis W Contrast  Result Date: 01/15/2018 CLINICAL DATA:  Abdominal pain. Nausea. Vomiting. Symptoms for 2 day. EXAM: CT ABDOMEN AND PELVIS WITH CONTRAST TECHNIQUE: Multidetector CT imaging of the abdomen and pelvis was performed using the standard protocol following bolus administration of intravenous contrast. CONTRAST:  100mL ISOVUE-300 IOPAMIDOL (ISOVUE-300) INJECTION 61% COMPARISON:  None. FINDINGS: Lower chest: Patchy and linear opacities in the dependent lower  lungs bilaterally are nonspecific. Hepatobiliary: Diffuse hepatic steatosis. A calcified gallstone is present. It measures 12 mm in caliber. Pancreas: Unremarkable Spleen: Unremarkable Adrenals/Urinary Tract: Simple cysts in both kidneys. Adrenal glands are within normal limits. Bladder is decompressed. Stomach/Bowel: There is wall thickening and inflammatory change involving the region of the sigmoid colon. There is stranding in the adjacent fat. Diverticulosis of the descending  and sigmoid colon is present. There is secondary inflammation of lower abdominal small bowel loops. There is ill-defined fluid density between small bowel loops in the left side of the abdomen on image 50 of series 2, but there is no extraluminal bowel gas or well-defined abscess. Post appendectomy staples. Mildly prominent mid and upper small bowel loops measure up to 3.1 cm in caliber. There may be an element of partial small bowel obstruction secondary to the left lower quadrant inflammatory process. Stomach and duodenum are within normal limits. Vascular/Lymphatic: No evidence of aortic aneurysm. No abnormal retroperitoneal adenopathy. Reproductive: Borderline prostate enlargement. Other: Noncontributory Musculoskeletal: No vertebral compression deformity. Mild degenerative disc disease in the lower lumbar spine. IMPRESSION: Findings consistent with acute sigmoid diverticulitis. No extraluminal bowel gas. No well-defined abscess. Mild partial small bowel obstruction secondary to the left lower quadrant inflammatory process. Cholelithiasis. Diffuse hepatic steatosis. Bibasilar atelectasis favored over patchy airspace disease. Electronically Signed   By: Jolaine Click M.D.   On: 01/15/2018 10:02   Dg Abd Portable 1v  Result Date: 01/16/2018 CLINICAL DATA:  Patient with abdominal distension. EXAM: PORTABLE ABDOMEN - 1 VIEW COMPARISON:  CT abdomen pelvis 01/15/2018. FINDINGS: Gas is demonstrated within nondilated loops of large and small  bowel within the abdomen. Supine evaluation limited for the detection of free intraperitoneal air. Osseous structures are unremarkable. IMPRESSION: Nonobstructed bowel gas pattern. Electronically Signed   By: Annia Belt M.D.   On: 01/16/2018 08:21    Micro Results     No results found for this or any previous visit (from the past 240 hour(s)).  Today   Subjective    Henry Paul today has no headache,no chest abdominal pain,no new weakness tingling or numbness, feels much better wants to go home today.    Objective   Blood pressure (!) 125/96, pulse 75, temperature 98.4 F (36.9 C), temperature source Oral, resp. rate 19, height 5\' 9"  (1.753 m), weight 84.5 kg (186 lb 4.6 oz), SpO2 98 %.   Intake/Output Summary (Last 24 hours) at 01/17/2018 0852 Last data filed at 01/17/2018 0616 Gross per 24 hour  Intake 3700 ml  Output 2350 ml  Net 1350 ml    Exam Awake Alert, Oriented x 3, No new F.N deficits, Normal affect [place].AT,PERRAL Supple Neck,No JVD, No cervical lymphadenopathy appriciated.  Symmetrical Chest wall movement, Good air movement bilaterally, CTAB RRR,No Gallops,Rubs or new Murmurs, No Parasternal Heave +ve B.Sounds, Abd Soft, Non tender, No organomegaly appriciated, No rebound -guarding or rigidity. No Cyanosis, Clubbing or edema, No new Rash or bruise   Data Review   CBC w Diff:  Lab Results  Component Value Date   WBC 7.3 01/17/2018   HGB 13.9 01/17/2018   HCT 41.5 01/17/2018   PLT 281 01/17/2018    CMP:  Lab Results  Component Value Date   NA 136 01/17/2018   K 4.2 01/17/2018   CL 105 01/17/2018   CO2 22 01/17/2018   BUN 16 01/17/2018   CREATININE 1.02 01/17/2018   PROT 7.5 01/15/2018   ALBUMIN 4.0 01/15/2018   BILITOT 1.3 (H) 01/15/2018   ALKPHOS 54 01/15/2018   AST 18 01/15/2018   ALT 28 01/15/2018  .   Total Time in preparing paper work, data evaluation and todays exam - 35 minutes  Susa Raring M.D on 01/17/2018 at 8:52 AM  [facility]   Office  [phone]

## 2018-01-17 NOTE — Discharge Instructions (Signed)
Follow with Primary MD in 7 days   Get CBC, CMP checked  by Primary MD in 5-7 days    Activity: As tolerated with Full fall precautions use walker/cane & assistance as needed  Disposition Home    Diet:   Soft diet for 2-3 days then advance to regular consistency as tolerated  For Heart failure patients - Check your Weight same time everyday, if you gain over 2 pounds, or you develop in leg swelling, experience more shortness of breath or chest pain, call your Primary MD immediately. Follow Cardiac Low Salt Diet and 1.5 lit/day fluid restriction.  Special Instructions: If you have smoked or chewed Tobacco  in the last 2 yrs please stop smoking, stop any regular Alcohol  and or any Recreational drug use.  On your next visit with your primary care physician please Get Medicines reviewed and adjusted.  Please request your Prim.MD to go over all Hospital Tests and Procedure/Radiological results at the follow up, please get all Hospital records sent to your Prim MD by signing hospital release before you go home.  If you experience worsening of your admission symptoms, develop shortness of breath, life threatening emergency, suicidal or homicidal thoughts you must seek medical attention immediately by calling 911 or calling your MD immediately  if symptoms less severe.  You Must read complete instructions/literature along with all the possible adverse reactions/side effects for all the Medicines you take and that have been prescribed to you. Take any new Medicines after you have completely understood and accpet all the possible adverse reactions/side effects.

## 2018-01-17 NOTE — Progress Notes (Signed)
Discharge instructions reviewed with the patient. All questions answered. Patient ambulated to vehicle with belongings by nurse tech

## 2018-01-27 DIAGNOSIS — K5730 Diverticulosis of large intestine without perforation or abscess without bleeding: Secondary | ICD-10-CM | POA: Diagnosis not present

## 2018-01-27 DIAGNOSIS — Z1211 Encounter for screening for malignant neoplasm of colon: Secondary | ICD-10-CM | POA: Diagnosis not present

## 2018-01-27 DIAGNOSIS — K760 Fatty (change of) liver, not elsewhere classified: Secondary | ICD-10-CM | POA: Diagnosis not present

## 2018-04-06 IMAGING — CT CT ABD-PELV W/ CM
2 of 5 series · 16 of 46 positions shown, 18 images · IV contrast (ISOVUE)
Comparison: None.

CLINICAL DATA: Abdominal pain. Nausea. Vomiting. Symptoms for 2
day.

EXAM:
CT ABDOMEN AND PELVIS WITH CONTRAST
TECHNIQUE: Multidetector CT imaging of the abdomen and pelvis was performed
using the standard protocol following bolus administration of
intravenous contrast.
CONTRAST:  100mL OY6YWN-GCC IOPAMIDOL (OY6YWN-GCC) INJECTION 61%

[Series 2: axial st · axial · 0.80mm/px · z∈[-461,-51]mm · 13 of 96 slices shown, 15 images]
[im 7/96  soft-tissue]
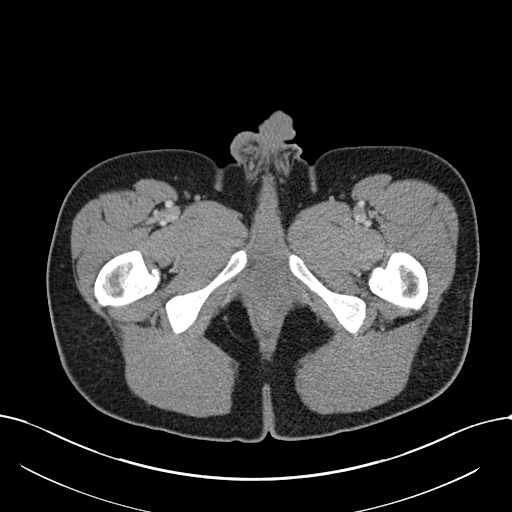
[im 7/96  bone]
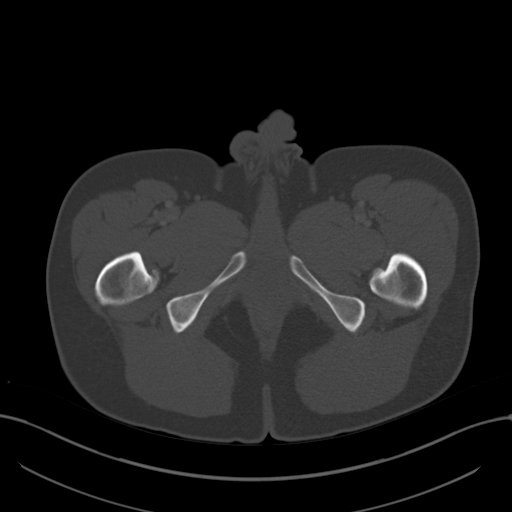
[im 14/96  soft-tissue]
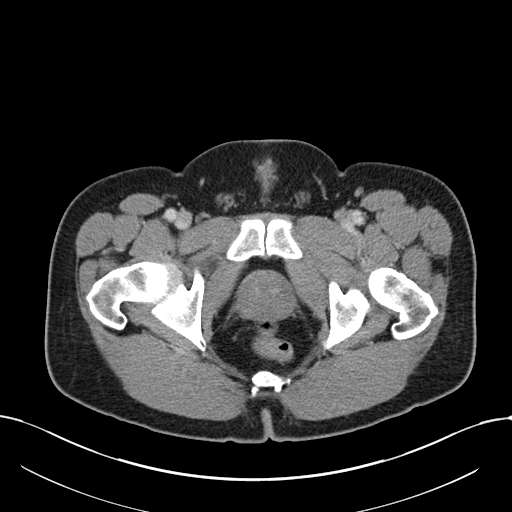
[im 21/96  soft-tissue]
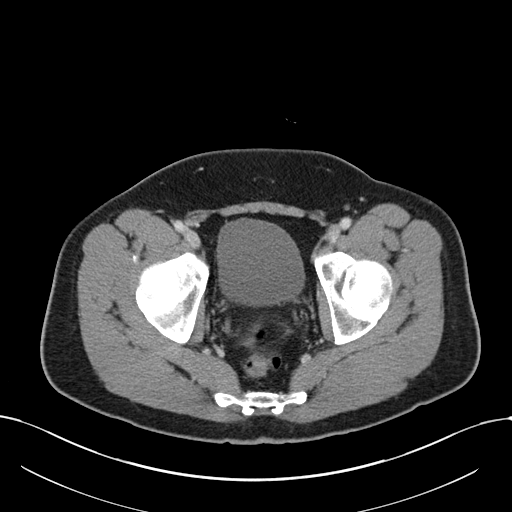
[im 28/96  soft-tissue]
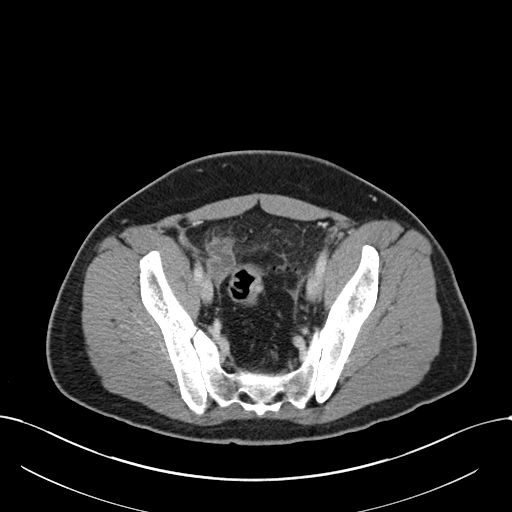
[im 34/96  soft-tissue]
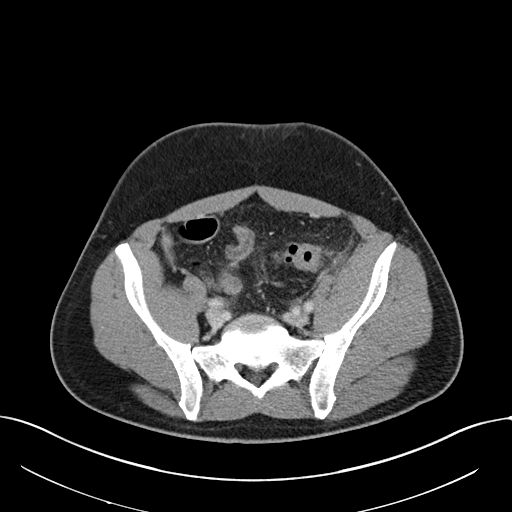
[im 41/96  soft-tissue]
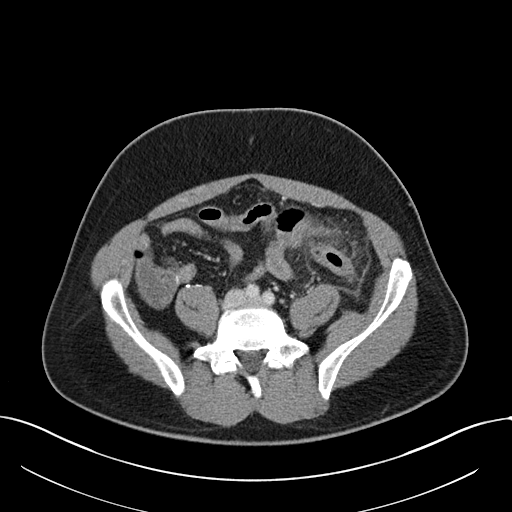
[im 48/96  soft-tissue]
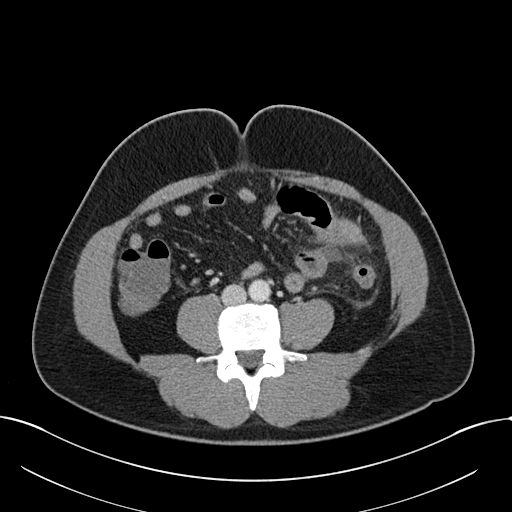
[im 55/96  soft-tissue]
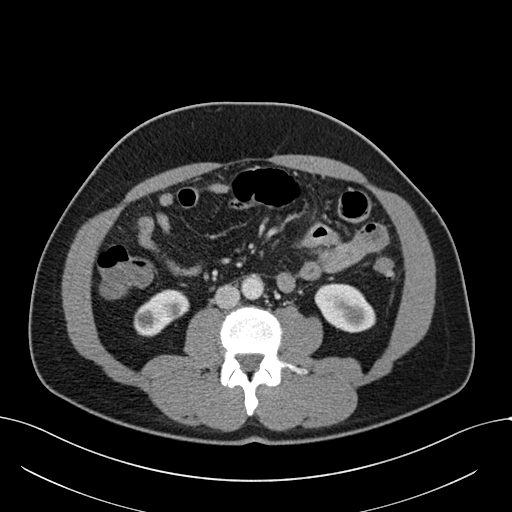
[im 62/96  soft-tissue]
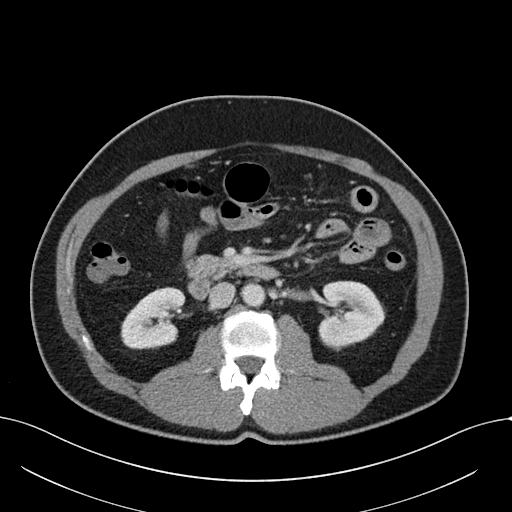
[im 62/96  bone]
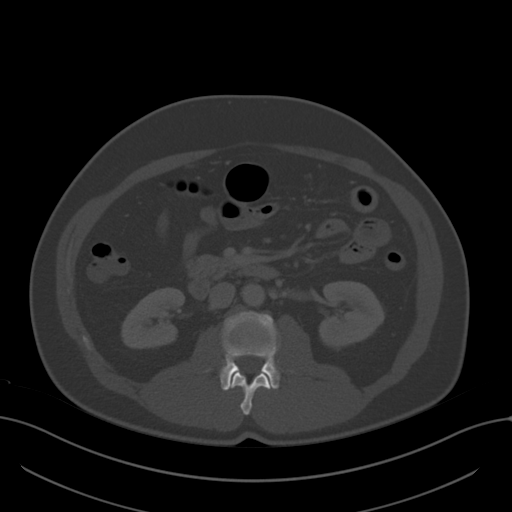
[im 68/96  soft-tissue]
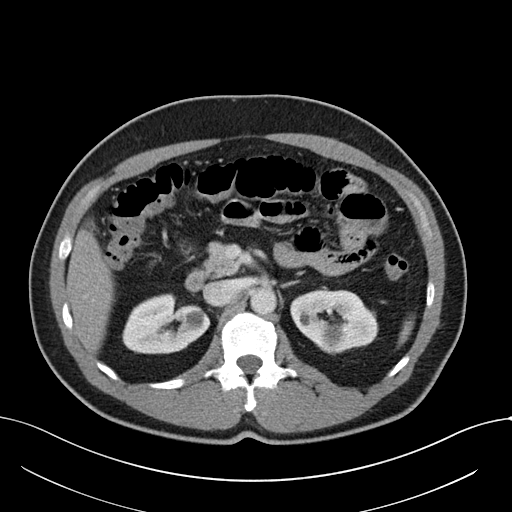
[im 75/96  soft-tissue]
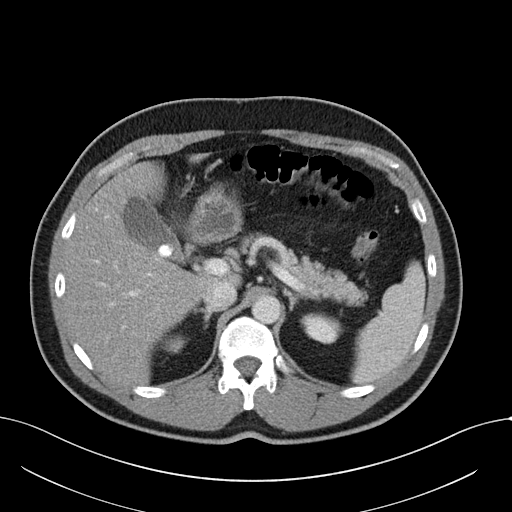
[im 82/96  soft-tissue]
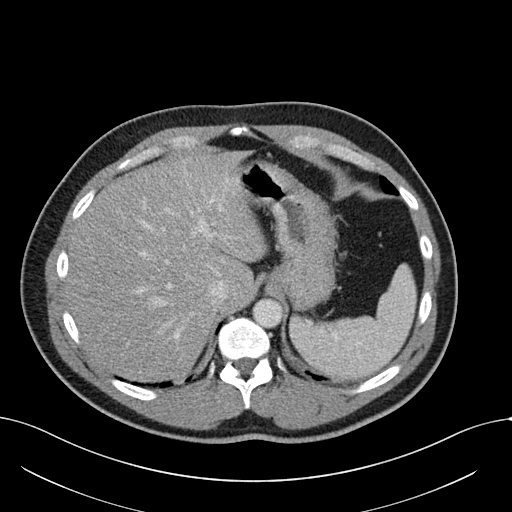
[im 89/96  soft-tissue]
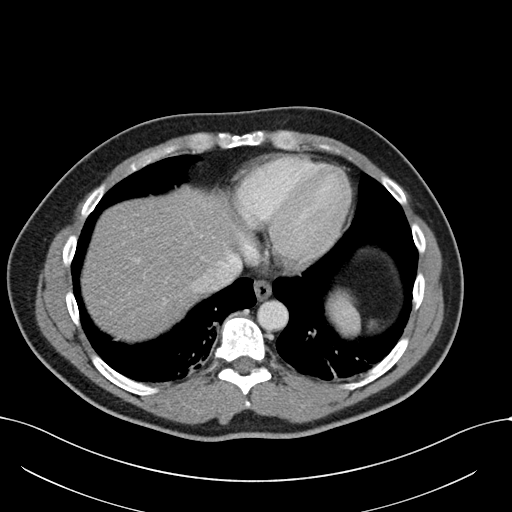

[Series 5: coronal st · coronal · 0.75mm/px · 3 of 92 slices shown]
[im 31/92  soft-tissue]
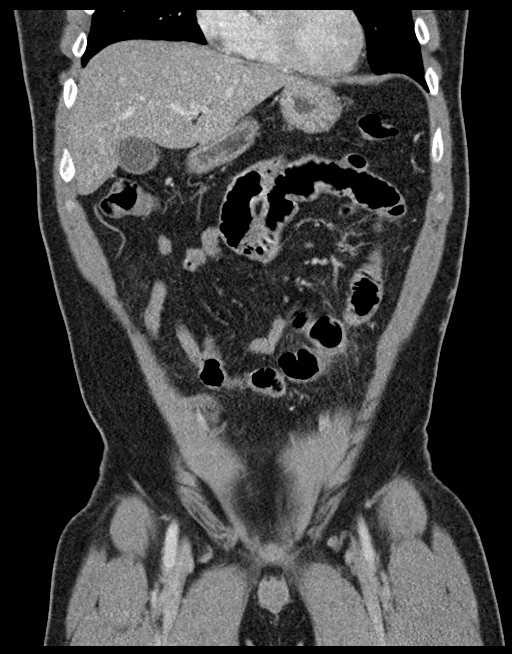
[im 41/92  soft-tissue]
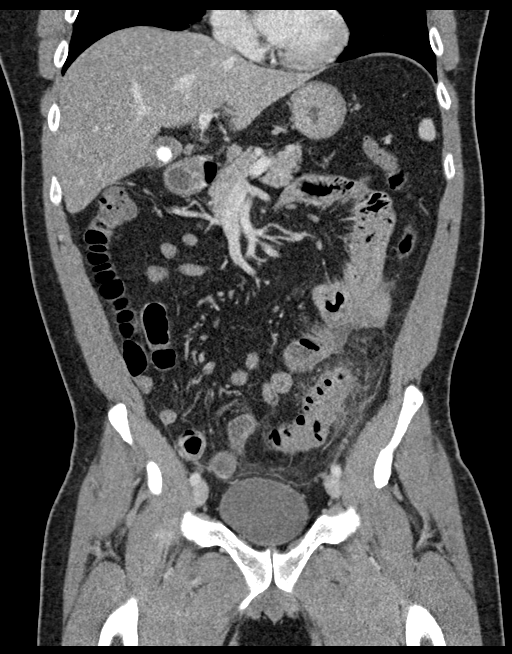
[im 51/92  soft-tissue]
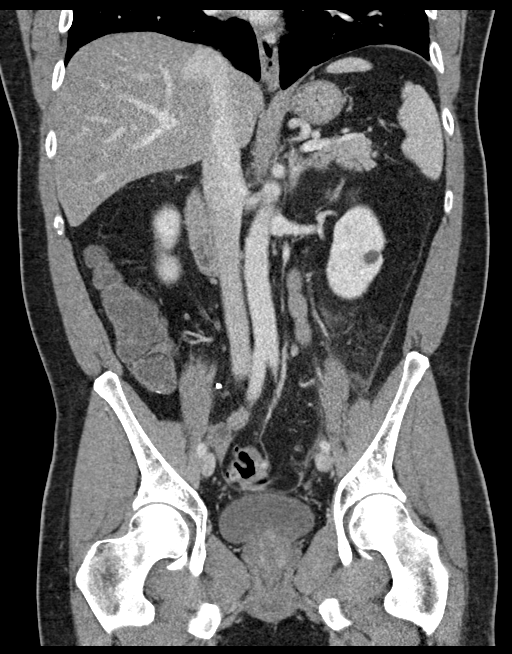

[16 of 46 positions shown; findings below may reference images not displayed]

FINDINGS: Lower chest: Patchy and linear opacities in the dependent lower
lungs bilaterally are nonspecific.

Hepatobiliary: Diffuse hepatic steatosis. A calcified gallstone is
present. It measures 12 mm in caliber.

Pancreas: Unremarkable

Spleen: Unremarkable

Adrenals/Urinary Tract: Simple cysts in both kidneys. Adrenal glands
are within normal limits. Bladder is decompressed.

Stomach/Bowel: There is wall thickening and inflammatory change
involving the region of the sigmoid colon. There is stranding in the
adjacent fat. Diverticulosis of the descending and sigmoid colon is
present. There is secondary inflammation of lower abdominal small
bowel loops. There is ill-defined fluid density between small bowel
loops in the left side of the abdomen on image 50 of series 2, but
there is no extraluminal bowel gas or well-defined abscess.

Post appendectomy staples. Mildly prominent mid and upper small
bowel loops measure up to 3.1 cm in caliber. There may be an element
of partial small bowel obstruction secondary to the left lower
quadrant inflammatory process.. Stomach and duodenum are within
normal limits.

Vascular/Lymphatic: No evidence of aortic aneurysm. No abnormal
retroperitoneal adenopathy.

Reproductive: Borderline prostate enlargement.

Other: Noncontributory

Musculoskeletal: No vertebral compression deformity. Mild
degenerative disc disease in the lower lumbar spine.
IMPRESSION: Findings consistent with acute sigmoid diverticulitis. No
extraluminal bowel gas. No well-defined abscess.

Mild partial small bowel obstruction secondary to the left lower
quadrant inflammatory process.

Cholelithiasis.

Diffuse hepatic steatosis.

Bibasilar atelectasis favored over patchy airspace disease.

## 2018-04-22 DIAGNOSIS — Z1211 Encounter for screening for malignant neoplasm of colon: Secondary | ICD-10-CM | POA: Diagnosis not present

## 2018-05-25 DIAGNOSIS — J189 Pneumonia, unspecified organism: Secondary | ICD-10-CM | POA: Diagnosis not present

## 2019-01-02 DIAGNOSIS — Z0000 Encounter for general adult medical examination without abnormal findings: Secondary | ICD-10-CM | POA: Diagnosis not present

## 2019-01-02 DIAGNOSIS — Z13220 Encounter for screening for lipoid disorders: Secondary | ICD-10-CM | POA: Diagnosis not present

## 2019-02-06 DIAGNOSIS — R748 Abnormal levels of other serum enzymes: Secondary | ICD-10-CM | POA: Diagnosis not present

## 2021-01-07 ENCOUNTER — Other Ambulatory Visit: Payer: Self-pay | Admitting: Gastroenterology

## 2021-01-07 DIAGNOSIS — R1033 Periumbilical pain: Secondary | ICD-10-CM

## 2021-01-20 ENCOUNTER — Ambulatory Visit
Admission: RE | Admit: 2021-01-20 | Discharge: 2021-01-20 | Disposition: A | Discharge status: Home / Self Care | Payer: 59 | Source: Ambulatory Visit | Attending: Gastroenterology | Admitting: Gastroenterology

## 2021-01-20 DIAGNOSIS — R1033 Periumbilical pain: Secondary | ICD-10-CM

## 2021-01-20 MED ORDER — IOPAMIDOL (ISOVUE-300) INJECTION 61%
100.0000 mL | Freq: Once | INTRAVENOUS | Status: AC | PRN
  Administered 2021-01-20: 100 mL via INTRAVENOUS

## 2021-01-24 ENCOUNTER — Other Ambulatory Visit: Payer: Self-pay | Admitting: Gastroenterology

## 2021-01-24 DIAGNOSIS — R1033 Periumbilical pain: Secondary | ICD-10-CM

## 2021-02-12 ENCOUNTER — Ambulatory Visit
Admission: RE | Admit: 2021-02-12 | Discharge: 2021-02-12 | Disposition: A | Discharge status: Home / Self Care | Payer: 59 | Source: Ambulatory Visit | Attending: Gastroenterology | Admitting: Gastroenterology

## 2021-02-12 DIAGNOSIS — R1033 Periumbilical pain: Secondary | ICD-10-CM

## 2021-02-12 MED ORDER — IOPAMIDOL (ISOVUE-300) INJECTION 61%
100.0000 mL | Freq: Once | INTRAVENOUS | Status: AC | PRN
  Administered 2021-02-12: 100 mL via INTRAVENOUS

## 2021-03-25 ENCOUNTER — Other Ambulatory Visit: Payer: Self-pay | Admitting: Surgery

## 2021-04-08 NOTE — Patient Instructions (Addendum)
DUE TO COVID-19 ONLY ONE VISITOR IS ALLOWED TO COME WITH YOU AND STAY IN THE WAITING ROOM ONLY DURING PRE OP AND PROCEDURE.   **NO VISITORS ARE ALLOWED IN THE SHORT STAY AREA OR RECOVERY ROOM!!**        Your procedure is scheduled on: 04/17/21   Report to [facility] Main  Entrance   Report to Short Stay at 5:15 AM   [facility])   Call this number if you have problems the morning of surgery [phone]   Do not eat food :After Midnight.   May have liquids until  4:30 AM  day of surgery  CLEAR LIQUID DIET  Foods Allowed                                                                     Foods Excluded  Water, Black Coffee and tea, regular and decaf             liquids that you cannot  Plain Jell-O in any flavor  (No red)                                    see through such as: Fruit ices (not with fruit pulp)                                            milk, soups, orange juice              Iced Popsicles (No red)                                                All solid food                                   Apple juices Sports drinks like Gatorade (No red) Lightly seasoned clear broth or consume(fat free) Sugar, honey syrup     Complete one Ensure drink the morning of surgery at 4:30 AM the day of surgery.     The day of surgery:  Drink ONE (1) Pre-Surgery Clear Ensure by 4:30 am the morning of surgery. Drink in one sitting. Do not sip.  This drink was given to you during your hospital  pre-op appointment visit. Nothing else to drink after completing the  Pre-Surgery Clear Ensure.          If you have questions, please contact your surgeon's office.     Oral Hygiene is also important to reduce your risk of infection.                                    Remember - BRUSH YOUR TEETH THE MORNING OF SURGERY WITH YOUR REGULAR TOOTHPASTE   Do NOT smoke after Midnight   Take these medicines the morning of  surgery with A SIP OF WATER:  None                               You may not have any metal on your body including jewelry, and body piercing             Do not wear lotions, powders, cologne, or deodorant               Men may shave face and neck.   Do not bring valuables to the hospital. [facility] IS NOT   RESPONSIBLE   FOR VALUABLES.   Contacts, dentures or bridgework may not be worn into surgery.    Patients discharged the day of surgery will not be allowed to drive home.   Special Instructions: Bring a copy of your healthcare power of attorney and living will documents         the day of surgery if you haven't scanned them  in before.              Please read over the following fact sheets you were given: IF YOU HAVE QUESTIONS ABOUT YOUR PRE OP INSTRUCTIONS PLEASE CALL  [phone]   [facility] - Preparing for Surgery Before surgery, you can play an important role.  Because skin is not sterile, your skin needs to be as free of germs as possible.  You can reduce the number of germs on your skin by washing with CHG (chlorahexidine gluconate) soap before surgery.  CHG is an antiseptic cleaner which kills germs and bonds with the skin to continue killing germs even after washing. Please DO NOT use if you have an allergy to CHG or antibacterial soaps.  If your skin becomes reddened/irritated stop using the CHG and inform your nurse when you arrive at Short Stay. Do not shave (including legs and underarms) for at least 48 hours prior to the first CHG shower.  You may shave your face/neck.  Please follow these instructions carefully:  1.  Shower with CHG Soap the night before surgery and the  morning of surgery.  2.  If you choose to wash your hair, wash your hair first as usual with your normal  shampoo.  3.  After you shampoo, rinse your hair and body thoroughly to remove the shampoo.                             4.  Use CHG as you would any other liquid soap.  You can apply chg directly to the skin and wash.  Gently with a scrungie or clean  washcloth.  5.  Apply the CHG Soap to your body ONLY FROM THE NECK DOWN.   Do   not use on face/ open                           Wound or open sores. Avoid contact with eyes, ears mouth and   genitals (private parts).                       Wash face,  Genitals (private parts) with your normal soap.             6.  Wash thoroughly, paying special attention to the area where your    surgery  will be performed.  7.  Thoroughly rinse  your body with warm water from the neck down.  8.  DO NOT shower/wash with your normal soap after using and rinsing off the CHG Soap.                9.  Pat yourself dry with a clean towel.            10.  Wear clean pajamas.            11.  Place clean sheets on your bed the night of your first shower and do not  sleep with pets. Day of Surgery : Do not apply any lotions/deodorants the morning of surgery.  Please wear clean clothes to the hospital/surgery center.  FAILURE TO FOLLOW THESE INSTRUCTIONS MAY RESULT IN THE CANCELLATION OF YOUR SURGERY  PATIENT SIGNATURE_________________________________  NURSE SIGNATURE__________________________________  ________________________________________________________________________

## 2021-04-11 ENCOUNTER — Other Ambulatory Visit: Payer: Self-pay

## 2021-04-11 ENCOUNTER — Encounter (HOSPITAL_COMMUNITY): Payer: Self-pay

## 2021-04-11 ENCOUNTER — Encounter (HOSPITAL_COMMUNITY)
Admission: RE | Admit: 2021-04-11 | Discharge: 2021-04-11 | Disposition: A | Discharge status: Home / Self Care | Payer: 59 | Source: Ambulatory Visit | Attending: Surgery | Admitting: Surgery

## 2021-04-11 HISTORY — DX: Fatty (change of) liver, not elsewhere classified: K76.0

## 2021-04-11 HISTORY — DX: Calculus of gallbladder without cholecystitis without obstruction: K80.20

## 2021-04-11 HISTORY — DX: Gastro-esophageal reflux disease without esophagitis: K21.9

## 2021-04-11 NOTE — Progress Notes (Signed)
COVID Vaccine Completed:Yes Date COVID Vaccine completed: x2 Has received booster:Yes COVID vaccine manufacturer: Pfizer     Date of COVID positive in last 90 days: No  PCP - [name], MD Cardiologist - N/A  Chest x-ray - N/A EKG - N/A Stress Test -  ECHO - N/A Cardiac Cath - N/A Pacemaker/ICD device last checked: N/A  Sleep Study - N/A CPAP - N/A  Fasting Blood Sugar - N/A Checks Blood Sugar ___N/A__ times a day  Blood Thinner Instructions: N/A Aspirin Instructions: N/A Last Dose: N/A  Activity level:  Able to exercise without symptoms    N/A Anesthesia review:    Patient denies shortness of breath, fever, cough and chest pain at PAT appointment   Patient verbalized understanding of instructions that were given to them at the PAT appointment. Patient was also instructed that they will need to review over the PAT instructions again at home before surgery.

## 2021-04-16 NOTE — Anesthesia Preprocedure Evaluation (Addendum)
Anesthesia Evaluation  Patient identified by MRN, date of birth, ID band Patient awake    Reviewed: Allergy & Precautions, NPO status , Patient's Chart, lab work & pertinent test results  History of Anesthesia Complications Negative for: history of anesthetic complications  Airway Mallampati: II  TM Distance: >3 FB Neck ROM: Full    Dental  (+) Teeth Intact, Dental Advisory Given   Pulmonary neg pulmonary ROS,    Pulmonary exam normal        Cardiovascular negative cardio ROS Normal cardiovascular exam     Neuro/Psych negative neurological ROS     GI/Hepatic Neg liver ROS, GERD  ,Symptomatic gallstones   Endo/Other  negative endocrine ROS  Renal/GU negative Renal ROS  negative genitourinary   Musculoskeletal negative musculoskeletal ROS (+)   Abdominal   Peds  Hematology negative hematology ROS (+)   Anesthesia Other Findings   Reproductive/Obstetrics                            Anesthesia Physical Anesthesia Plan  ASA: 2  Anesthesia Plan: General   Post-op Pain Management:    Induction: Intravenous  PONV Risk Score and Plan: 2 and Ondansetron, Dexamethasone, Treatment may vary due to age or medical condition and Midazolam  Airway Management Planned: Oral ETT  Additional Equipment: None  Intra-op Plan:   Post-operative Plan: Extubation in OR  Informed Consent: I have reviewed the patients History and Physical, chart, labs and discussed the procedure including the risks, benefits and alternatives for the proposed anesthesia with the patient or authorized representative who has indicated his/her understanding and acceptance.     Dental advisory given  Plan Discussed with:   Anesthesia Plan Comments:        Anesthesia Quick Evaluation

## 2021-04-16 NOTE — H&P (Signed)
Henry Paul Appointment: Apr 09, 2021 2:20 PM Location: [facility] Patient #: [id number] DOB: 03-13-1966 Married / Language: [name] / Race: White Male   History of Present Illness ([name] [name] MD; 2021/04/09 3:03 PM) The patient is a 55 year old male who presents for evaluation of gall stones.  Chief complaint: Colicky abdominal pain  This is a 55 year old gentleman referred by Dr. Loreta Ave for possible symptomatic gallstones.  For several months he's been having diffuse abdominal pain with nausea and occasional vomiting after fatty meals.  He has a history of diverticulitis in the past.  He had surgery.  The appendicitis which ended up being diverticulitis.  He was out of town and the Careers adviser went ahead and remove his appendix.  He's been having intermittent diarrhea.  He had a CT of the abdomen and pelvis as well as a CT enterography which was unremarkable except for gallstones.  He denies jaundice.  His pain continued crampy, sharp, and moderate to severe at times.   Past Surgical History [name], [place]; 2021-04-09 2:37 PM) Appendectomy    Diagnostic Studies History [name], [place]; April 09, 2021 2:37 PM) Colonoscopy   1-5 years ago  Allergies [name], [place]; 2021/04/09 2:38 PM) No Known Allergies   [Apr 09, 2021]: Allergies Reconciled    Medication History [name], CMA; 04-09-21 2:38 PM) Omeprazole  (20MG  Capsule DR, Oral) Active. Pantoprazole Sodium  (20MG  Tablet DR, Oral) Active. Tamsulosin HCl  (0.4MG  Capsule, Oral) Active. Terbinafine HCl  (250MG  Tablet, Oral) Active.  Social History , ; 2021/04/09 2:37 PM) Caffeine use   Carbonated beverages, Coffee, Tea. No alcohol use   No drug use   Tobacco use   Never smoker.  Family History [name], [place]; Apr 09, 2021 2:37 PM) Cancer   Brother.  Other Problems [name], [place]; 04-09-2021 2:37 PM) Cholelithiasis   Enlarged Prostate   Gastroesophageal Reflux Disease      Review of Systems [name]  CMA; 04/09/21 2:37 PM) Skin Not Present- Change in Wart/Mole, Dryness, Hives, Jaundice, New Lesions, Non-Healing Wounds, Rash and Ulcer. HEENT Present- Seasonal Allergies and Wears glasses/contact lenses. Not Present- Earache, Hearing Loss, Hoarseness, Nose Bleed, Oral Ulcers, Ringing in the Ears, Sinus Pain, Sore Throat, Visual Disturbances and Yellow Eyes. Respiratory Present- Snoring. Not Present- Bloody sputum, Chronic Cough, Difficulty Breathing and Wheezing. Breast Not Present- Breast Mass, Breast Pain, Nipple Discharge and Skin Changes. Cardiovascular Not Present- Chest Pain, Difficulty Breathing Lying Down, Leg Cramps, Palpitations, Rapid Heart Rate, Shortness of Breath and Swelling of Extremities. Gastrointestinal Present- Abdominal Pain and Nausea. Not Present- Bloating, Bloody Stool, Change in Bowel Habits, Chronic diarrhea, Constipation, Difficulty Swallowing, Excessive gas, Gets full quickly at meals, Hemorrhoids, Indigestion, Rectal Pain and Vomiting. Male Genitourinary Not Present- Blood in Urine, Change in Urinary Stream, Frequency, Impotence, Nocturia, Painful Urination, Urgency and Urine Leakage. Musculoskeletal Not Present- Back Pain, Joint Pain, Joint Stiffness, Muscle Pain, Muscle Weakness and Swelling of Extremities. Neurological Not Present- Decreased Memory, Fainting, Headaches, Numbness, Seizures, Tingling, Tremor, Trouble walking and Weakness. Psychiatric Not Present- Anxiety, Bipolar, Change in Sleep Pattern, Depression, Fearful and Frequent crying. Endocrine Not Present- Cold Intolerance, Excessive Hunger, Hair Changes, Heat Intolerance, Hot flashes and New Diabetes. Hematology Not Present- Blood Thinners, Easy Bruising, Excessive bleeding, Gland problems, HIV and Persistent Infections.  Vitals 03/27/2021 Fox CMA; 09-Apr-2021 2:38 PM) 04/09/2021 2:37 PM Weight: 189.38 lb   Height: 66 in  Body Surface Area: 1.95 m   Body Mass Index: 30.57 kg/m   Temp.: 98 F  Pulse: 101  (Regular)    P.OX: 96% (Room air) BP: 124/78(Sitting, Left Arm, Standard)       Physical Exam (Lexie Koehl [name] MD; 03/25/2021 3:04 PM) The physical exam findings are as follows: Note:  He appears comfortable exam today  His abdomen is soft. There is some mild guarding in the right upper quadrant. There are no hernias. There is no hepatomegaly  Lungs clear  CV RRR  Skin without rash  Neuro grossly intact    Assessment & Plan   SYMPTOMATIC CHOLELITHIASIS (K80.20)  Impression: I have reviewed his notes in the electronic medical records and reviewed his CT scans. I do believe he has symptomatic gallstones. I have discussed this with he and his wife who agree as well. We discussed proceeding with a laparoscopic cholecystectomy. I discussed the procedure in detail. The patient was given Agricultural engineer. We discussed the risks and benefits of a laparoscopic cholecystectomy and possible cholangiogram including, but not limited to, bleeding, infection, injury to surrounding structures such as the intestine or liver, bile leak, retained gallstones, need to convert to an open procedure, prolonged diarrhea, blood clots such as DVT, common bile duct injury, anesthesia risks, and possible need for additional procedures. The likelihood of improvement in symptoms and return to the patient's normal status is good. We discussed the typical post-operative recovery course. All questions were answered.

## 2021-04-17 ENCOUNTER — Encounter (HOSPITAL_COMMUNITY)
Admission: RE | Disposition: A | Discharge status: Home / Self Care | Payer: Self-pay | Source: Home / Self Care | Attending: Surgery

## 2021-04-17 ENCOUNTER — Ambulatory Visit (HOSPITAL_COMMUNITY): Payer: 59 | Admitting: Certified Registered"

## 2021-04-17 ENCOUNTER — Ambulatory Visit (HOSPITAL_COMMUNITY)
Admission: RE | Admit: 2021-04-17 | Discharge: 2021-04-17 | Disposition: A | Discharge status: Home / Self Care | Payer: 59 | Attending: Surgery | Admitting: Surgery

## 2021-04-17 ENCOUNTER — Encounter (HOSPITAL_COMMUNITY): Payer: Self-pay | Admitting: Surgery

## 2021-04-17 DIAGNOSIS — K8010 Calculus of gallbladder with chronic cholecystitis without obstruction: Secondary | ICD-10-CM | POA: Diagnosis not present

## 2021-04-17 DIAGNOSIS — Z79899 Other long term (current) drug therapy: Secondary | ICD-10-CM | POA: Insufficient documentation

## 2021-04-17 DIAGNOSIS — K8020 Calculus of gallbladder without cholecystitis without obstruction: Secondary | ICD-10-CM | POA: Diagnosis present

## 2021-04-17 HISTORY — PX: CHOLECYSTECTOMY: SHX55

## 2021-04-17 SURGERY — LAPAROSCOPIC CHOLECYSTECTOMY
Anesthesia: General

## 2021-04-17 MED ORDER — EPHEDRINE SULFATE-NACL 50-0.9 MG/10ML-% IV SOSY
PREFILLED_SYRINGE | INTRAVENOUS | Status: DC | PRN
  Administered 2021-04-17 (×2): 10 mg via INTRAVENOUS

## 2021-04-17 MED ORDER — OXYCODONE HCL 5 MG PO TABS: 5.0000 mg | ORAL_TABLET | Freq: Once | ORAL | Status: DC | PRN

## 2021-04-17 MED ORDER — ROCURONIUM BROMIDE 10 MG/ML (PF) SYRINGE
PREFILLED_SYRINGE | INTRAVENOUS | Status: AC
  Filled 2021-04-17: qty 10

## 2021-04-17 MED ORDER — LACTATED RINGERS IV SOLN: INTRAVENOUS | Status: DC

## 2021-04-17 MED ORDER — OXYCODONE HCL 5 MG/5ML PO SOLN: 5.0000 mg | Freq: Once | ORAL | Status: DC | PRN

## 2021-04-17 MED ORDER — ORAL CARE MOUTH RINSE: 15.0000 mL | Freq: Once | OROMUCOSAL | Status: AC

## 2021-04-17 MED ORDER — BUPIVACAINE HCL (PF) 0.5 % IJ SOLN
INTRAMUSCULAR | Status: DC | PRN
  Administered 2021-04-17: 20 mL

## 2021-04-17 MED ORDER — SODIUM CHLORIDE 0.9 % IR SOLN
Status: DC | PRN
  Administered 2021-04-17: 1000 mL

## 2021-04-17 MED ORDER — ONDANSETRON HCL 4 MG/2ML IJ SOLN
INTRAMUSCULAR | Status: AC
  Filled 2021-04-17: qty 2

## 2021-04-17 MED ORDER — DEXAMETHASONE SODIUM PHOSPHATE 10 MG/ML IJ SOLN
INTRAMUSCULAR | Status: DC | PRN
  Administered 2021-04-17: 8 mg via INTRAVENOUS

## 2021-04-17 MED ORDER — CHLORHEXIDINE GLUCONATE CLOTH 2 % EX PADS: 6.0000 | MEDICATED_PAD | Freq: Once | CUTANEOUS | Status: DC

## 2021-04-17 MED ORDER — AMISULPRIDE (ANTIEMETIC) 5 MG/2ML IV SOLN: 10.0000 mg | Freq: Once | INTRAVENOUS | Status: DC | PRN

## 2021-04-17 MED ORDER — CEFAZOLIN SODIUM-DEXTROSE 2-4 GM/100ML-% IV SOLN
2.0000 g | INTRAVENOUS | Status: AC
  Administered 2021-04-17: 2 g via INTRAVENOUS
  Filled 2021-04-17: qty 100

## 2021-04-17 MED ORDER — FENTANYL CITRATE (PF) 100 MCG/2ML IJ SOLN: 25.0000 ug | INTRAMUSCULAR | Status: DC | PRN

## 2021-04-17 MED ORDER — ONDANSETRON HCL 4 MG/2ML IJ SOLN
INTRAMUSCULAR | Status: DC | PRN
  Administered 2021-04-17: 4 mg via INTRAVENOUS

## 2021-04-17 MED ORDER — BUPIVACAINE HCL (PF) 0.5 % IJ SOLN
INTRAMUSCULAR | Status: AC
  Filled 2021-04-17: qty 30

## 2021-04-17 MED ORDER — ONDANSETRON HCL 4 MG/2ML IJ SOLN: 4.0000 mg | Freq: Once | INTRAMUSCULAR | Status: DC | PRN

## 2021-04-17 MED ORDER — LIDOCAINE 2% (20 MG/ML) 5 ML SYRINGE
INTRAMUSCULAR | Status: AC
  Filled 2021-04-17: qty 5

## 2021-04-17 MED ORDER — ACETAMINOPHEN 500 MG PO TABS
1000.0000 mg | ORAL_TABLET | ORAL | Status: AC
  Administered 2021-04-17: 1000 mg via ORAL
  Filled 2021-04-17: qty 2

## 2021-04-17 MED ORDER — MIDAZOLAM HCL 2 MG/2ML IJ SOLN
INTRAMUSCULAR | Status: AC
  Filled 2021-04-17: qty 2

## 2021-04-17 MED ORDER — MIDAZOLAM HCL 2 MG/2ML IJ SOLN
INTRAMUSCULAR | Status: DC | PRN
  Administered 2021-04-17: 2 mg via INTRAVENOUS

## 2021-04-17 MED ORDER — CHLORHEXIDINE GLUCONATE 0.12 % MT SOLN
15.0000 mL | Freq: Once | OROMUCOSAL | Status: AC
  Administered 2021-04-17: 15 mL via OROMUCOSAL

## 2021-04-17 MED ORDER — OXYCODONE HCL 5 MG PO TABS: 5.0000 mg | ORAL_TABLET | Freq: Four times a day (QID) | ORAL | 0 refills | Status: DC | PRN

## 2021-04-17 MED ORDER — ROCURONIUM BROMIDE 10 MG/ML (PF) SYRINGE
PREFILLED_SYRINGE | INTRAVENOUS | Status: DC | PRN
  Administered 2021-04-17: 60 mg via INTRAVENOUS

## 2021-04-17 MED ORDER — PHENYLEPHRINE 40 MCG/ML (10ML) SYRINGE FOR IV PUSH (FOR BLOOD PRESSURE SUPPORT)
PREFILLED_SYRINGE | INTRAVENOUS | Status: DC | PRN
  Administered 2021-04-17 (×2): 40 ug via INTRAVENOUS
  Administered 2021-04-17: 60 ug via INTRAVENOUS

## 2021-04-17 MED ORDER — 0.9 % SODIUM CHLORIDE (POUR BTL) OPTIME
TOPICAL | Status: DC | PRN
  Administered 2021-04-17: 1000 mL

## 2021-04-17 MED ORDER — FENTANYL CITRATE (PF) 100 MCG/2ML IJ SOLN
INTRAMUSCULAR | Status: AC
  Filled 2021-04-17: qty 2

## 2021-04-17 MED ORDER — ENSURE PRE-SURGERY PO LIQD
296.0000 mL | Freq: Once | ORAL | Status: DC
  Filled 2021-04-17: qty 296

## 2021-04-17 MED ORDER — PHENYLEPHRINE 40 MCG/ML (10ML) SYRINGE FOR IV PUSH (FOR BLOOD PRESSURE SUPPORT)
PREFILLED_SYRINGE | INTRAVENOUS | Status: AC
  Filled 2021-04-17: qty 10

## 2021-04-17 MED ORDER — FENTANYL CITRATE (PF) 250 MCG/5ML IJ SOLN
INTRAMUSCULAR | Status: DC | PRN
  Administered 2021-04-17: 100 ug via INTRAVENOUS

## 2021-04-17 MED ORDER — PROPOFOL 10 MG/ML IV BOLUS
INTRAVENOUS | Status: AC
  Filled 2021-04-17: qty 40

## 2021-04-17 MED ORDER — DEXAMETHASONE SODIUM PHOSPHATE 10 MG/ML IJ SOLN
INTRAMUSCULAR | Status: AC
  Filled 2021-04-17: qty 1

## 2021-04-17 MED ORDER — LIDOCAINE 2% (20 MG/ML) 5 ML SYRINGE
INTRAMUSCULAR | Status: DC | PRN
  Administered 2021-04-17: 60 mg via INTRAVENOUS

## 2021-04-17 MED ORDER — EPHEDRINE 5 MG/ML INJ
INTRAVENOUS | Status: AC
  Filled 2021-04-17: qty 10

## 2021-04-17 MED ORDER — PROPOFOL 10 MG/ML IV BOLUS
INTRAVENOUS | Status: DC | PRN
  Administered 2021-04-17: 200 mg via INTRAVENOUS

## 2021-04-17 MED ORDER — SUGAMMADEX SODIUM 200 MG/2ML IV SOLN
INTRAVENOUS | Status: DC | PRN
  Administered 2021-04-17: 180 mg via INTRAVENOUS
  Administered 2021-04-17: 20 mg via INTRAVENOUS

## 2021-04-17 SURGICAL SUPPLY — 33 items
ADH SKN CLS APL DERMABOND .7 (GAUZE/BANDAGES/DRESSINGS) ×1
APL PRP STRL LF DISP 70% ISPRP (MISCELLANEOUS) ×1
APPLIER CLIP 5 13 M/L LIGAMAX5 (MISCELLANEOUS) ×3
APR CLP MED LRG 5 ANG JAW (MISCELLANEOUS) ×1
BAG SPEC RTRVL LRG 6X4 10 (ENDOMECHANICALS) ×1
CABLE HIGH FREQUENCY MONO STRZ (ELECTRODE) ×3 IMPLANT
CHLORAPREP W/TINT 26 (MISCELLANEOUS) ×3 IMPLANT
CLIP APPLIE 5 13 M/L LIGAMAX5 (MISCELLANEOUS) ×1 IMPLANT
COVER MAYO STAND STRL (DRAPES) IMPLANT
COVER WAND RF STERILE (DRAPES) IMPLANT
DECANTER SPIKE VIAL GLASS SM (MISCELLANEOUS) ×1 IMPLANT
DERMABOND ADVANCED (GAUZE/BANDAGES/DRESSINGS) ×2
DERMABOND ADVANCED .7 DNX12 (GAUZE/BANDAGES/DRESSINGS) ×1 IMPLANT
DRAPE C-ARM 42X120 X-RAY (DRAPES) IMPLANT
ELECT REM PT RETURN 15FT ADLT (MISCELLANEOUS) ×3 IMPLANT
GLOVE SURG ENC MOIS LTX SZ7.5 (GLOVE) ×3 IMPLANT
GOWN STRL REUS W/TWL XL LVL3 (GOWN DISPOSABLE) ×6 IMPLANT
HEMOSTAT SURGICEL 4X8 (HEMOSTASIS) IMPLANT
KIT BASIN OR (CUSTOM PROCEDURE TRAY) ×3 IMPLANT
KIT TURNOVER KIT A (KITS) ×3 IMPLANT
PENCIL SMOKE EVACUATOR (MISCELLANEOUS) IMPLANT
POUCH SPECIMEN RETRIEVAL 10MM (ENDOMECHANICALS) ×3 IMPLANT
SCISSORS LAP 5X35 DISP (ENDOMECHANICALS) ×3 IMPLANT
SET CHOLANGIOGRAPH MIX (MISCELLANEOUS) IMPLANT
SET IRRIG TUBING LAPAROSCOPIC (IRRIGATION / IRRIGATOR) ×3 IMPLANT
SET TUBE SMOKE EVAC HIGH FLOW (TUBING) ×3 IMPLANT
SLEEVE XCEL OPT CAN 5 100 (ENDOMECHANICALS) ×6 IMPLANT
SUT MNCRL AB 4-0 PS2 18 (SUTURE) ×3 IMPLANT
TOWEL OR 17X26 10 PK STRL BLUE (TOWEL DISPOSABLE) ×3 IMPLANT
TOWEL OR NON WOVEN STRL DISP B (DISPOSABLE) ×3 IMPLANT
TRAY LAPAROSCOPIC (CUSTOM PROCEDURE TRAY) ×3 IMPLANT
TROCAR BLADELESS OPT 5 100 (ENDOMECHANICALS) ×3 IMPLANT
TROCAR XCEL BLUNT TIP 100MML (ENDOMECHANICALS) ×3 IMPLANT

## 2021-04-17 NOTE — Anesthesia Postprocedure Evaluation (Signed)
Anesthesia Post Note  Patient: Henry Paul  Procedure(s) Performed: LAPAROSCOPIC CHOLECYSTECTOMY     Patient location during evaluation: PACU Anesthesia Type: General Level of consciousness: awake and alert Pain management: pain level controlled Vital Signs Assessment: post-procedure vital signs reviewed and stable Respiratory status: spontaneous breathing, nonlabored ventilation and respiratory function stable Cardiovascular status: blood pressure returned to baseline and stable Postop Assessment: no apparent nausea or vomiting Anesthetic complications: no   No notable events documented.  Last Vitals:  Vitals:   04/17/21 0845 04/17/21 0906  BP: (!) 140/98 135/90  Pulse: 68 66  Resp: 20 16  Temp:  36.6 C  SpO2: 98% 100%    Last Pain:  Vitals:   04/17/21 0906  TempSrc:   PainSc: 0-No pain                 Lucretia Kern

## 2021-04-17 NOTE — Anesthesia Procedure Notes (Signed)
Procedure Name: Intubation Date/Time: 04/17/2021 7:17 AM Performed by: Eben Burow, CRNA Pre-anesthesia Checklist: Patient identified, Emergency Drugs available, Suction available, Patient being monitored and Timeout performed Patient Re-evaluated:Patient Re-evaluated prior to induction Oxygen Delivery Method: Circle system utilized Preoxygenation: Pre-oxygenation with 100% oxygen Induction Type: IV induction Ventilation: Mask ventilation without difficulty Laryngoscope Size: Mac and 4 Grade View: Grade I Tube type: Oral Tube size: 7.5 mm Number of attempts: 1 Airway Equipment and Method: Stylet Placement Confirmation: ETT inserted through vocal cords under direct vision, positive ETCO2 and breath sounds checked- equal and bilateral Secured at: 22 cm Tube secured with: Tape Dental Injury: Teeth and Oropharynx as per pre-operative assessment

## 2021-04-17 NOTE — Discharge Instructions (Addendum)
[facility] ______[facility] LAPAROSCOPIC SURGERY: POST OP INSTRUCTIONS Always review your discharge instruction sheet given to you by the facility where your surgery was performed. IF YOU HAVE DISABILITY OR FAMILY LEAVE FORMS, YOU MUST BRING THEM TO THE OFFICE FOR PROCESSING.   DO NOT GIVE THEM TO YOUR DOCTOR.  A prescription for pain medication may be given to you upon discharge.  Take your pain medication as prescribed, if needed.  If narcotic pain medicine is not needed, then you may take acetaminophen (Tylenol) or ibuprofen (Advil) as needed. Take your usually prescribed medications unless otherwise directed. If you need a refill on your pain medication, please contact your pharmacy.  They will contact our office to request authorization. Prescriptions will not be filled after 5pm or on week-ends. You should follow a light diet the first few days after arrival home, such as soup and crackers, etc.  Be sure to include lots of fluids daily. Most patients will experience some swelling and bruising in the area of the incisions.  Ice packs will help.  Swelling and bruising can take several days to resolve.  It is common to experience some constipation if taking pain medication after surgery.  Increasing fluid intake and taking a stool softener (such as Colace) will usually help or prevent this problem from occurring.  A mild laxative (Milk of Magnesia or Miralax) should be taken according to package instructions if there are no bowel movements after 48 hours. Unless discharge instructions indicate otherwise, you may remove your bandages 24-48 hours after surgery, and you may shower at that time.  You may have steri-strips (small skin tapes) in place directly over the incision.  These strips should be left on the skin for 7-10 days.  If your surgeon used skin glue on the incision, you may shower in 24 hours.  The glue will flake off over the next 2-3 weeks.  Any sutures or staples will be  removed at the office during your follow-up visit. ACTIVITIES:  You may resume regular (light) daily activities beginning the next day--such as daily self-care, walking, climbing stairs--gradually increasing activities as tolerated.  You may have sexual intercourse when it is comfortable.  Refrain from any heavy lifting or straining until approved by your doctor. You may drive when you are no longer taking prescription pain medication, you can comfortably wear a seatbelt, and you can safely maneuver your car and apply brakes. RETURN TO WORK:  __________________________________________________________ You should see your doctor in the office for a follow-up appointment approximately 2-3 weeks after your surgery.  Make sure that you call for this appointment within a day or two after you arrive home to insure a convenient appointment time. OTHER INSTRUCTIONS: OK TO SHOWER STARTING TOMORROW ICE PACK, TYLENOL, AND IBUPROFEN ALSO FOR PAIN NO LIFTING MORE THAN 15 TO 20 POUNDS FOR 2 WEEKS __________________________________________________________________________________________________________________________ __________________________________________________________________________________________________________________________ WHEN TO CALL YOUR DOCTOR: Fever over 101.0 Inability to urinate Continued bleeding from incision. Increased pain, redness, or drainage from the incision. Increasing abdominal pain  The clinic staff is available to answer your questions during regular business hours.  Please don't hesitate to call and ask to speak to one of the nurses for clinical concerns.  If you have a medical emergency, go to the nearest emergency room or call 911.  A surgeon from [facility] is always on call at the hospital. [address], [address], [place], [place]  [address] ? [address] [address], [place], [place]   [address] [phone] ?   [phone] ? FAX [phone] Web site:  [email or web address]  

## 2021-04-17 NOTE — Transfer of Care (Signed)
Immediate Anesthesia Transfer of Care Note  Patient: Henry Paul  Procedure(s) Performed: LAPAROSCOPIC CHOLECYSTECTOMY  Patient Location: PACU  Anesthesia Type:General  Level of Consciousness: drowsy and patient cooperative  Airway & Oxygen Therapy: Patient Spontanous Breathing and Patient connected to face mask oxygen  Post-op Assessment: Report given to RN and Post -op Vital signs reviewed and stable  Post vital signs: Reviewed and stable  Last Vitals:  Vitals Value Taken Time  BP 149/103 04/17/21 0815  Temp    Pulse 76 04/17/21 0817  Resp 22 04/17/21 0817  SpO2 100 % 04/17/21 0817  Vitals shown include unvalidated device data.  Last Pain:  Vitals:   04/17/21 0542  TempSrc:   PainSc: 0-No pain         Complications: No notable events documented.

## 2021-04-17 NOTE — Op Note (Signed)
Laparoscopic Cholecystectomy Procedure Note  Indications: This patient presents with symptomatic gallbladder disease and will undergo laparoscopic cholecystectomy.  Pre-operative Diagnosis: symptomatic cholelithiasis  Post-operative Diagnosis: Same  Surgeon: Abigail Miyamoto MD  Assistants: Alfonso Patten MD ([facility] Resident)  Anesthesia: General endotracheal anesthesia  ASA Class: 2  Procedure Details  The patient was seen again in the Holding Room. The risks, benefits, complications, treatment options, and expected outcomes were discussed with the patient. The possibilities of reaction to medication, pulmonary aspiration, perforation of viscus, bleeding, recurrent infection, finding a normal gallbladder, the need for additional procedures, failure to diagnose a condition, the possible need to convert to an open procedure, and creating a complication requiring transfusion or operation were discussed with the patient. The likelihood of improving the patient's symptoms with return to their baseline status is good.  The patient and/or family concurred with the proposed plan, giving informed consent. The site of surgery properly noted. The patient was taken to Operating Room, identified as Nila Nephew and the procedure verified as Laparoscopic Cholecystectomy with Intraoperative Cholangiogram. A Time Out was held and the above information confirmed.  Prior to the induction of general anesthesia, antibiotic prophylaxis was administered. General endotracheal anesthesia was then administered and tolerated well. After the induction, the abdomen was prepped with Chloraprep and draped in sterile fashion. The patient was positioned in the supine position.  Local anesthetic agent was injected into the skin near the umbilicus and an incision made. We dissected down to the abdominal fascia with blunt dissection.  The fascia was incised vertically and we entered the peritoneal cavity bluntly.  A  pursestring suture of 0-Vicryl was placed around the fascial opening.  The Hasson cannula was inserted and secured with the stay suture.  Pneumoperitoneum was then created with CO2 and tolerated well without any adverse changes in the patient's vital signs.  A 5-mm port was placed in the subxiphoid position.  Two 5-mm ports were placed in the right upper quadrant. All skin incisions were infiltrated with a local anesthetic agent before making the incision and placing the trocars.   We positioned the patient in reverse Trendelenburg, tilted slightly to the patient's left.  The gallbladder was identified, the fundus grasped and retracted cephalad. It was chronically thick walled. Adhesions were lysed bluntly and with the electrocautery where indicated, taking care not to injure any adjacent organs or viscus. The infundibulum was grasped and retracted laterally, exposing the peritoneum overlying the triangle of Calot. This was then divided and exposed in a blunt fashion. The cystic duct was clearly identified and bluntly dissected circumferentially. A small bridging vessel on the cystic duct had to be clipped separately after retracting back. A critical view of the cystic duct and cystic artery was obtained.  The cystic duct was then ligated with clips and divided. The cystic artery was, dissected free, ligated with clips and divided as well.   The gallbladder was dissected from the liver bed in retrograde fashion with the electrocautery. The gallbladder was removed and placed in an Endocatch sac. The liver bed was irrigated and inspected. Hemostasis was achieved with the electrocautery. Copious irrigation was utilized and was repeatedly aspirated until clear.  The gallbladder and Endocatch sac were then removed through the umbilical port site.  The pursestring suture was used to close the umbilical fascia.    We again inspected the right upper quadrant for hemostasis.  Pneumoperitoneum was released as we removed  the trocars.  4-0 Monocryl was used to close the skin.  Skin glue was then applied. The patient was then extubated and brought to the recovery room in stable condition. Instrument, sponge, and needle counts were correct at closure and at the conclusion of the case.   Findings: Chronic Cholecystitis with Cholelithiasis  Estimated Blood Loss: Minimal         Drains: 0         Specimens: Gallbladder           Complications: None; patient tolerated the procedure well.         Disposition: PACU - hemodynamically stable.         Condition: stable

## 2021-04-17 NOTE — Interval H&P Note (Signed)
History and Physical Interval Note:no change in H and P  04/17/2021 7:01 AM  Henry Paul  has presented today for surgery, with the diagnosis of SYMPTOMATIC GALLSTONES.  The various methods of treatment have been discussed with the patient and family. After consideration of risks, benefits and other options for treatment, the patient has consented to  Procedure(s): LAPAROSCOPIC CHOLECYSTECTOMY (N/A) as a surgical intervention.  The patient's history has been reviewed, patient examined, no change in status, stable for surgery.  I have reviewed the patient's chart and labs.  Questions were answered to the patient's satisfaction.     Abigail Miyamoto

## 2021-04-18 ENCOUNTER — Encounter (HOSPITAL_COMMUNITY): Payer: Self-pay | Admitting: Surgery

## 2021-04-18 LAB — SURGICAL PATHOLOGY

## 2022-08-20 ENCOUNTER — Emergency Department (HOSPITAL_COMMUNITY): Payer: 59

## 2022-08-20 ENCOUNTER — Emergency Department (HOSPITAL_COMMUNITY)
Admission: EM | Admit: 2022-08-20 | Discharge: 2022-08-20 | Disposition: A | Discharge status: Home / Self Care | Payer: 59 | Attending: Emergency Medicine | Admitting: Emergency Medicine

## 2022-08-20 ENCOUNTER — Encounter (HOSPITAL_COMMUNITY): Payer: Self-pay | Admitting: Emergency Medicine

## 2022-08-20 ENCOUNTER — Other Ambulatory Visit: Payer: Self-pay

## 2022-08-20 DIAGNOSIS — R042 Hemoptysis: Secondary | ICD-10-CM | POA: Insufficient documentation

## 2022-08-20 LAB — PROTIME-INR
INR: 1.1 (ref 0.8–1.2)
Prothrombin Time: 13.8 seconds (ref 11.4–15.2)

## 2022-08-20 LAB — BASIC METABOLIC PANEL
Anion gap: 8 (ref 5–15)
BUN: 21 mg/dL — ABNORMAL HIGH (ref 6–20)
CO2: 22 mmol/L (ref 22–32)
Calcium: 8.9 mg/dL (ref 8.9–10.3)
Chloride: 108 mmol/L (ref 98–111)
Creatinine, Ser: 1.28 mg/dL — ABNORMAL HIGH (ref 0.61–1.24)
GFR, Estimated: >60 mL/min (ref >60)
Glucose, Bld: 91 mg/dL (ref 70–99)
Potassium: 4 mmol/L (ref 3.5–5.1)
Sodium: 138 mmol/L (ref 135–145)

## 2022-08-20 LAB — CBC WITH DIFFERENTIAL/PLATELET
Abs Immature Granulocytes: 0.03 10*3/uL (ref 0.00–0.07)
Basophils Absolute: 0.1 10*3/uL (ref 0.0–0.1)
Basophils Relative: 1 %
Eosinophils Absolute: 0.1 10*3/uL (ref 0.0–0.5)
Eosinophils Relative: 1 %
HCT: 43.1 % (ref 39.0–52.0)
Hemoglobin: 14.8 g/dL (ref 13.0–17.0)
Immature Granulocytes: 0 %
Lymphocytes Relative: 23 %
Lymphs Abs: 1.9 10*3/uL (ref 0.7–4.0)
MCH: 30.2 pg (ref 26.0–34.0)
MCHC: 34.3 g/dL (ref 30.0–36.0)
MCV: 88 fL (ref 80.0–100.0)
Monocytes Absolute: 0.7 10*3/uL (ref 0.1–1.0)
Monocytes Relative: 8 %
Neutro Abs: 5.3 10*3/uL (ref 1.7–7.7)
Neutrophils Relative %: 67 %
Platelets: 248 10*3/uL (ref 150–400)
RBC: 4.9 MIL/uL (ref 4.22–5.81)
RDW: 12.8 % (ref 11.5–15.5)
WBC: 8 10*3/uL (ref 4.0–10.5)
nRBC: 0 % (ref 0.0–0.2)

## 2022-08-20 LAB — TROPONIN I (HIGH SENSITIVITY)
Troponin I (High Sensitivity): 7 ng/L (ref <18)
Troponin I (High Sensitivity): 8 ng/L (ref <18)

## 2022-08-20 LAB — TYPE AND SCREEN
ABO/RH(D): O POS
Antibody Screen: NEGATIVE

## 2022-08-20 LAB — D-DIMER, QUANTITATIVE: D-Dimer, Quant: 0.32 ug/mL-FEU (ref 0.00–0.50)

## 2022-08-20 MED ORDER — DOXYCYCLINE HYCLATE 100 MG PO CAPS: 100.0000 mg | ORAL_CAPSULE | Freq: Two times a day (BID) | ORAL | 0 refills | Status: AC

## 2022-08-20 MED ORDER — SODIUM CHLORIDE 0.9 % IV SOLN
1.0000 g | Freq: Once | INTRAVENOUS | Status: AC
  Administered 2022-08-20: 1 g via INTRAVENOUS
  Filled 2022-08-20: qty 10

## 2022-08-20 MED ORDER — SODIUM CHLORIDE 0.9 % IV BOLUS
1000.0000 mL | Freq: Once | INTRAVENOUS | Status: AC
  Administered 2022-08-20: 1000 mL via INTRAVENOUS

## 2022-08-20 MED ORDER — DOXYCYCLINE HYCLATE 100 MG PO TABS
100.0000 mg | ORAL_TABLET | Freq: Once | ORAL | Status: AC
  Administered 2022-08-20: 100 mg via ORAL
  Filled 2022-08-20: qty 1

## 2022-08-20 MED ORDER — IOHEXOL 350 MG/ML SOLN
75.0000 mL | Freq: Once | INTRAVENOUS | Status: AC | PRN
  Administered 2022-08-20: 75 mL via INTRAVENOUS

## 2022-08-20 NOTE — ED Provider Notes (Signed)
[facility]-EMERGENCY DEPT Provider Note   CSN: [id number] Arrival date & time: 08/20/22  1809     History  Chief Complaint  Patient presents with   Hemoptysis    NASZIR COTT is a 56 y.o. male presenting to ED with hemoptysis.  The patient reports that he was feeling fine today, and playing with his cat in the house.  He says he suddenly felt that he had something like "heartburn" and then coughed up some blood.  He continued coughing blood.  This is never happened to him before.  He is not a smoker.  Denies history of incarceration, tuberculosis, foreign travel, DVT or PE.  He is not on blood thinners.  He does not feel lightheaded.  EMS reports the patient was quite hypertensive on their arrival.  Blood pressure is improved now.  HPI     Home Medications Prior to Admission medications   Medication Sig Start Date End Date Taking? Authorizing Provider  doxycycline (VIBRAMYCIN) 100 MG capsule Take 1 capsule (100 mg total) by mouth 2 (two) times daily for 7 days. 08/21/22 08/28/22 Yes Trexton Escamilla, Kermit Balo, MD  pantoprazole (PROTONIX) 20 MG tablet Take 20 mg by mouth every evening. 04/02/21  Yes [provider]  tamsulosin (FLOMAX) 0.4 MG CAPS capsule Take 0.4 mg by mouth every evening. 03/24/21  Yes [provider]  oxyCODONE (OXY IR/ROXICODONE) 5 MG immediate release tablet Take 1 tablet (5 mg total) by mouth every 6 (six) hours as needed for moderate pain or severe pain. Patient not taking: Reported on 08/20/2022 04/17/21   Abigail Miyamoto, MD      Allergies    Patient has no known allergies.    Review of Systems   Review of Systems  Physical Exam Updated Vital Signs BP (!) 127/96   Pulse 70   Temp 98 F (36.7 C) (Oral)   Resp 13   SpO2 99%  Physical Exam Constitutional:      General: He is not in acute distress. HENT:     Head: Normocephalic and atraumatic.  Eyes:     Conjunctiva/sclera: Conjunctivae normal.     Pupils: Pupils  are equal, round, and reactive to light.  Cardiovascular:     Rate and Rhythm: Normal rate and regular rhythm.     Pulses: Normal pulses.  Pulmonary:     Effort: Pulmonary effort is normal. No respiratory distress.  Abdominal:     General: There is no distension.     Tenderness: There is no abdominal tenderness.  Musculoskeletal:        General: No swelling or tenderness.  Skin:    General: Skin is warm and dry.  Neurological:     General: No focal deficit present.     Mental Status: He is alert and oriented to person, place, and time. Mental status is at baseline.  Psychiatric:        Mood and Affect: Mood normal.        Behavior: Behavior normal.     ED Results / Procedures / Treatments   Labs (all labs ordered are listed, but only abnormal results are displayed) Labs Reviewed  BASIC METABOLIC PANEL - Abnormal; Notable for the following components:      Result Value   BUN 21 (*)    Creatinine, Ser 1.28 (*)    All other components within normal limits  CBC WITH DIFFERENTIAL/PLATELET  D-DIMER, QUANTITATIVE  PROTIME-INR  TYPE AND SCREEN  ABO/RH  TROPONIN I (HIGH SENSITIVITY)  TROPONIN I (HIGH SENSITIVITY)    EKG EKG Interpretation  Date/Time:  Thursday August 20 2022 19:56:18 EDT Ventricular Rate:  83 PR Interval:  151 QRS Duration: 72 QT Interval:  360 QTC Calculation: 423 R Axis:   83 Text Interpretation: Sinus rhythm Abnormal R-wave progression, early transition Confirmed by Alvester Chou [phone]) on 08/20/2022 11:59:03 PM  Radiology CT Angio Chest PE W and/or Wo Contrast  Result Date: 08/20/2022 CLINICAL DATA:  Hemoptysis. Elevated blood pressure. EXAM: CT ANGIOGRAPHY CHEST WITH CONTRAST TECHNIQUE: Multidetector CT imaging of the chest was performed using the standard protocol during bolus administration of intravenous contrast. Multiplanar CT image reconstructions and MIPs were obtained to evaluate the vascular anatomy. RADIATION DOSE REDUCTION: This exam  was performed according to the departmental dose-optimization program which includes automated exposure control, adjustment of the mA and/or kV according to patient size and/or use of iterative reconstruction technique. CONTRAST:  15mL OMNIPAQUE IOHEXOL 350 MG/ML SOLN COMPARISON:  None Available. FINDINGS: Cardiovascular: The heart is normal in size. No pericardial effusion. The aorta is normal in caliber. No dissection. Age advanced coronary artery calcifications are noted. The pulmonary arterial tree is well opacified. No filling defects to suggest pulmonary embolism. Mediastinum/Nodes: No mediastinal or hilar mass or lymphadenopathy. The esophagus is grossly normal. Lungs/Pleura: Patchy ground-glass opacity in the left lower lobe in a somewhat peribronchovascular pattern. Nonspecific finding but could represent inflammation hemorrhage or atypical infection. No pleural effusions. No focal airspace consolidation. No pulmonary nodules. The central tracheobronchial tree is unremarkable. Upper Abdomen: No significant upper abdominal findings. Musculoskeletal: No chest wall mass, supraclavicular or axillary adenopathy. The bony thorax is intact. Review of the MIP images confirms the above findings. IMPRESSION: 1. No CT findings for pulmonary embolism. 2. Normal thoracic aorta. 3. Age advanced coronary artery calcifications. 4. Patchy ground-glass opacity in the left lower lobe in a somewhat peribronchovascular pattern. Nonspecific finding but could represent inflammation, aspiration, hemorrhage or atypical infection. Electronically Signed   By: Rudie Meyer M.D.   On: 08/20/2022 21:55   DG Chest Portable 1 View  Result Date: 08/20/2022 CLINICAL DATA:  Hemoptysis. EXAM: PORTABLE CHEST 1 VIEW COMPARISON:  None Available. FINDINGS: Heart is normal in size. Normal mediastinal contours. Borderline hyperinflation with minimal central bronchial thickening. Subsegmental atelectasis in the lung bases. No confluent  consolidation. No pulmonary mass or visualized nodule. No pneumothorax or pleural effusion. Right greater than left apical pleuroparenchymal scarring. IMPRESSION: 1. Borderline hyperinflation and central bronchial thickening, suggesting bronchitis. 2. Subsegmental bibasilar atelectasis. 3. Biapical pleuroparenchymal scarring. Electronically Signed   By: Narda Rutherford M.D.   On: 08/20/2022 19:12    Procedures Procedures    Medications Ordered in ED Medications  sodium chloride 0.9 % bolus 1,000 mL (0 mLs Intravenous Stopped 08/20/22 2252)  iohexol (OMNIPAQUE) 350 MG/ML injection 75 mL (75 mLs Intravenous Contrast Given 08/20/22 2131)  cefTRIAXone (ROCEPHIN) 1 g in sodium chloride 0.9 % 100 mL IVPB (0 g Intravenous Stopped 08/20/22 2318)  doxycycline (VIBRA-TABS) tablet 100 mg (100 mg Oral Given 08/20/22 2248)    ED Course/ Medical Decision Making/ A&P Clinical Course as of 08/21/22 0000  Thu Aug 20, 2022  2241 Patient is doing well on reassessment, no further hemoptysis.  We will initiate antibiotics for potential aspiration versus pneumonia seen on CT, although this may also be blood products.  He has question whether he may have had pneumonia since he was feeling "a little off this week". [MT]    Clinical Course User Index [MT] Alvester Chou  J, MD                           Medical Decision Making Amount and/or Complexity of Data Reviewed Labs: ordered. Radiology: ordered. ECG/medicine tests: ordered.  Risk Prescription drug management.   This patient presents to the ED with concern for hemoptysis. This involves an extensive number of treatment options, and is a complaint that carries with it a high risk of complications and morbidity.  The differential diagnosis includes bronchitis versus pulmonary embolism versus pulmonary mass versus other.  This presentation does appear consistent with hemoptysis, not hematemesis.  Additional history obtained from EMS  I ordered and  personally interpreted labs.  The pertinent results include: Labs are unremarkable  I ordered imaging studies including x-ray chest, CT PE study I independently visualized and interpreted imaging which showed no pulmonary embolism or encroaching mass or pulmonary mass or tuberculosis, query atelectasis versus infection in the lung base, versus blood products I agree with the radiologist interpretation  The patient was maintained on a cardiac monitor.  I personally viewed and interpreted the cardiac monitored which showed an underlying rhythm of: Sinus rhythm  Per my interpretation the patient's ECG shows sinus rhythm, no acute ischemic findings  I ordered medication including fluid bolus ordered for mildly elevated BUN and creatinine, given need for iodine contrast to prevent renal injury  I have reviewed the patients home medicines and have made adjustments as needed  After the interventions noted above, I reevaluated the patient and found that they have: improved  Patient had no further hemoptysis throughout his 5 and half hour observation in the ED.  I suspect this may have been related to minor underlying pulmonary conditions such as bronchitis, the presentation is quite strange, given that he is not a smoker and does not have persistent coughing.  I think is reasonable to refer him to pulmonology, and have placed a pulm referral.  He also is requesting a referral for his sleep apnea, they may be able to assist him with this  Dispostion:  After consideration of the diagnostic results and the patients response to treatment, I feel that the patent would benefit from outpatient follow-up..         Final Clinical Impression(s) / ED Diagnoses Final diagnoses:  Hemoptysis    Rx / DC Orders ED Discharge Orders          Ordered    doxycycline (VIBRAMYCIN) 100 MG capsule  2 times daily        08/20/22 2243    Ambulatory referral to Pulmonology       Comments: Hemoptysis, unclear  etiology Also for OSA evaluation   08/20/22 2243              Wyvonnia Dusky, MD 08/21/22 0000

## 2022-08-20 NOTE — ED Triage Notes (Signed)
Patient presents from home home post potential epistaxis. He complains of hemoptysis. EMS reports initial BP being over 920 systolic. They noted he coughed about 30 ml of blood x 2.     EMS vitals: 152/88 BP 76 HR 99% SPO2 on room air

## 2022-09-07 ENCOUNTER — Encounter: Payer: Self-pay | Admitting: Pulmonary Disease

## 2022-09-07 ENCOUNTER — Ambulatory Visit (INDEPENDENT_AMBULATORY_CARE_PROVIDER_SITE_OTHER): Payer: 59 | Admitting: Pulmonary Disease

## 2022-09-07 VITALS — BP 122/80 | HR 83 | Temp 98.0°F | Ht 69.0 in | Wt 185.2 lb

## 2022-09-07 DIAGNOSIS — R0683 Snoring: Secondary | ICD-10-CM | POA: Diagnosis not present

## 2022-09-07 NOTE — Progress Notes (Signed)
Henry Paul    ST:[id number]    01-Sep-1966  Primary Care Physician:Husain, Denton Ar, MD  Referring Physician: Wyvonnia Dusky, MD [address] [place],  [place] [address]  Chief complaint:   Recently hemoptysis History of snoring  HPI:  Patient had hemoptysis a couple of weeks ago and had to go to the emergency department Only lasted about 15 to 20 minutes Was not feeling sick prior to the bout  No history of epistaxis, no history of GI bleed He does have nasal stuffiness  Some chest tightness afterwards and has been on Symbicort which seems to be helping Symbicort is associated with some dysphonia  Never smoked, works in [employer], some woodworking.  Not really exposed to a lot of particulate material  History of snoring, mouth hangs open when he sleeps Spouse has never really noticed apnea events He does have a dry mouth in the morning He wakes up feeling like he is at a decent nights rest, does not sleep for long hours anyways Usually while at work he does not feel sleepy, but when he has downtime he may tend to fall asleep  Usually goes to bed between 8 and 10 PM Takes him about 5 minutes to fall asleep About 4-5 awakenings Final wake up time about 4 AM His weight has been relatively stable  Is unaware that parents snored  Outpatient Encounter Medications as of 09/07/2022  Medication Sig   budesonide-formoterol (SYMBICORT) 160-4.5 MCG/ACT inhaler SMARTSIG:2 Puff(s) By Mouth Twice Daily   guaiFENesin (MUCINEX) 600 MG 12 hr tablet Take by mouth 2 (two) times daily as needed.   pantoprazole (PROTONIX) 20 MG tablet Take 20 mg by mouth every evening.   tamsulosin (FLOMAX) 0.4 MG CAPS capsule Take 0.4 mg by mouth every evening.   ciprofloxacin (CIPRO) 500 MG tablet SMARTSIG:1 Tablet(s) By Mouth Every 12 Hours (Patient not taking: Reported on 09/07/2022)   metroNIDAZOLE (FLAGYL) 250 MG tablet Take 250 mg by mouth 3 (three) times daily. (Patient not taking:  Reported on 09/07/2022)   oxyCODONE (OXY IR/ROXICODONE) 5 MG immediate release tablet Take 1 tablet (5 mg total) by mouth every 6 (six) hours as needed for moderate pain or severe pain. (Patient not taking: Reported on 08/20/2022)   No facility-administered encounter medications on file as of 09/07/2022.    Allergies as of 09/07/2022   (No Known Allergies)    Past Medical History:  Diagnosis Date   Fatty liver    Gallstones    GERD (gastroesophageal reflux disease)    History of laparoscopic appendectomy     Past Surgical History:  Procedure Laterality Date   APPENDECTOMY     CHOLECYSTECTOMY N/A 04/17/2021   Procedure: LAPAROSCOPIC CHOLECYSTECTOMY;  Surgeon: Coralie Keens, MD;  Location: [facility];  Service: General;  Laterality: N/A;   COLONOSCOPY     FINGER SURGERY Left    ring and middle fingers reatttachment after amputation   WISDOM TOOTH EXTRACTION      No family history on file.  Social History   Socioeconomic History   Marital status: Married    Spouse name: Not on file   Number of children: Not on file   Years of education: Not on file   Highest education level: Not on file  Occupational History   Not on file  Tobacco Use   Smoking status: Never   Smokeless tobacco: Never  Substance and Sexual Activity   Alcohol use: No   Drug use:  No   Sexual activity: Not on file  Other Topics Concern   Not on file  Social History Narrative   Not on file   Social Determinants of Health   Financial Resource Strain: Not on file  Food Insecurity: Not on file  Transportation Needs: Not on file  Physical Activity: Not on file  Stress: Not on file  Social Connections: Not on file  Intimate Partner Violence: Not on file    Review of Systems  Psychiatric/Behavioral:  Positive for sleep disturbance.     Vitals:   09/07/22 1440  BP: 122/80  Pulse: 83  Temp: 98 F (36.7 C)  SpO2: 98%     Physical Exam Constitutional:      Appearance: Normal appearance.   HENT:     Head: Normocephalic.     Mouth/Throat:     Mouth: Mucous membranes are moist.  Cardiovascular:     Rate and Rhythm: Normal rate and regular rhythm.     Heart sounds: No murmur heard.    No friction rub.  Pulmonary:     Effort: No respiratory distress.     Breath sounds: No stridor. No wheezing or rhonchi.  Musculoskeletal:     Cervical back: No rigidity or tenderness.  Neurological:     Mental Status: He is alert.  Psychiatric:        Mood and Affect: Mood normal.       09/07/2022    2:00 PM  Results of the Epworth flowsheet  Sitting and reading 1  Watching TV 3  Sitting, inactive in a public place (e.g. a theatre or a meeting) 3  As a passenger in a car for an hour without a break 0  Lying down to rest in the afternoon when circumstances permit 2  Sitting and talking to someone 0  Sitting quietly after a lunch without alcohol 0  In a car, while stopped for a few minutes in traffic 0  Total score 9     Data Reviewed: Recent CT scan reviewed with the patient showing some pneumonitis at the left base  Assessment:  Hemoptysis -May be related to posterior nasal passage bleed -Has no significant predisposition to lung disease -No previous episodes and no recurrence -Watchful waiting is appropriate  Snoring  Daytime sleepiness  Pathophysiology of sleep disordered breathing discussed with the patient Treatment options discussed   Plan/Recommendations: Schedule for an in lab split-night study to rule out significant obstructive sleep apnea  May continue using Symbicort for the next 6 to 8 weeks  May consider ENT evaluation for nasal stuffiness/congestion  Follow-up in about 3 to 4 months  Encouraged to call with significant concerns  Encouraged to call if any recurrence of hemoptysis   Sherrilyn Rist MD [facility] 09/07/2022, 3:18 PM  CC: Wyvonnia Dusky, MD

## 2022-09-07 NOTE — Patient Instructions (Signed)
Schedule in lab sleep study-split study  I will see you in about 3 months  Continue using your Symbicort for about 6 to 8 weeks, you can stop after that if you are feeling back to usual  Call with significant concerns

## 2022-09-08 NOTE — Addendum Note (Signed)
Addended byOralia Rud M on: 09/08/2022 01:25 PM   Modules accepted: Orders

## 2022-10-02 ENCOUNTER — Telehealth: Payer: Self-pay | Admitting: Pulmonary Disease

## 2022-10-05 NOTE — Telephone Encounter (Signed)
I called pt's wife and told her the last info I had was from about a year ago but the price for inlab study was around 3700.00 and with provider's interpretation that would be added to it.  That is before filing with insurance.  Told her it is still in process of being precerted with insurance and once that has been done we will get it scheduled.  She stated ok.  Nothing further needed at this time.

## 2023-09-24 ENCOUNTER — Encounter (HOSPITAL_COMMUNITY): Payer: Self-pay

## 2023-09-24 ENCOUNTER — Emergency Department (HOSPITAL_COMMUNITY): Payer: 59

## 2023-09-24 ENCOUNTER — Inpatient Hospital Stay (HOSPITAL_COMMUNITY)
Admission: EM | Admit: 2023-09-24 | Discharge: 2023-10-01 | DRG: 871 | Disposition: A | Discharge status: Home / Self Care | Payer: 59 | Attending: Family Medicine | Admitting: Family Medicine

## 2023-09-24 ENCOUNTER — Other Ambulatory Visit: Payer: Self-pay

## 2023-09-24 DIAGNOSIS — I10 Essential (primary) hypertension: Secondary | ICD-10-CM | POA: Diagnosis present

## 2023-09-24 DIAGNOSIS — K5720 Diverticulitis of large intestine with perforation and abscess without bleeding: Secondary | ICD-10-CM | POA: Diagnosis present

## 2023-09-24 DIAGNOSIS — N400 Enlarged prostate without lower urinary tract symptoms: Secondary | ICD-10-CM | POA: Diagnosis present

## 2023-09-24 DIAGNOSIS — K651 Peritoneal abscess: Secondary | ICD-10-CM | POA: Diagnosis present

## 2023-09-24 DIAGNOSIS — A419 Sepsis, unspecified organism: Secondary | ICD-10-CM | POA: Diagnosis present

## 2023-09-24 DIAGNOSIS — K5732 Diverticulitis of large intestine without perforation or abscess without bleeding: Secondary | ICD-10-CM | POA: Diagnosis present

## 2023-09-24 DIAGNOSIS — Z7951 Long term (current) use of inhaled steroids: Secondary | ICD-10-CM | POA: Diagnosis not present

## 2023-09-24 DIAGNOSIS — E871 Hypo-osmolality and hyponatremia: Secondary | ICD-10-CM | POA: Diagnosis present

## 2023-09-24 DIAGNOSIS — K5900 Constipation, unspecified: Secondary | ICD-10-CM | POA: Diagnosis present

## 2023-09-24 DIAGNOSIS — K219 Gastro-esophageal reflux disease without esophagitis: Secondary | ICD-10-CM | POA: Diagnosis present

## 2023-09-24 DIAGNOSIS — Z79899 Other long term (current) drug therapy: Secondary | ICD-10-CM | POA: Diagnosis not present

## 2023-09-24 DIAGNOSIS — R109 Unspecified abdominal pain: Secondary | ICD-10-CM | POA: Diagnosis present

## 2023-09-24 DIAGNOSIS — K5792 Diverticulitis of intestine, part unspecified, without perforation or abscess without bleeding: Principal | ICD-10-CM

## 2023-09-24 DIAGNOSIS — K760 Fatty (change of) liver, not elsewhere classified: Secondary | ICD-10-CM | POA: Diagnosis present

## 2023-09-24 DIAGNOSIS — K631 Perforation of intestine (nontraumatic): Secondary | ICD-10-CM

## 2023-09-24 DIAGNOSIS — Z9049 Acquired absence of other specified parts of digestive tract: Secondary | ICD-10-CM

## 2023-09-24 LAB — CBC WITH DIFFERENTIAL/PLATELET
Abs Immature Granulocytes: 0.15 10*3/uL — ABNORMAL HIGH (ref 0.00–0.07)
Basophils Absolute: 0 10*3/uL (ref 0.0–0.1)
Basophils Relative: 0 %
Eosinophils Absolute: 0 10*3/uL (ref 0.0–0.5)
Eosinophils Relative: 0 %
HCT: 43.9 % (ref 39.0–52.0)
Hemoglobin: 15.1 g/dL (ref 13.0–17.0)
Immature Granulocytes: 1 %
Lymphocytes Relative: 7 %
Lymphs Abs: 1.3 10*3/uL (ref 0.7–4.0)
MCH: 30.8 pg (ref 26.0–34.0)
MCHC: 34.4 g/dL (ref 30.0–36.0)
MCV: 89.6 fL (ref 80.0–100.0)
Monocytes Absolute: 1 10*3/uL (ref 0.1–1.0)
Monocytes Relative: 5 %
Neutro Abs: 17.5 10*3/uL — ABNORMAL HIGH (ref 1.7–7.7)
Neutrophils Relative %: 87 %
Platelets: 236 10*3/uL (ref 150–400)
RBC: 4.9 MIL/uL (ref 4.22–5.81)
RDW: 13.2 % (ref 11.5–15.5)
WBC: 20 10*3/uL — ABNORMAL HIGH (ref 4.0–10.5)
nRBC: 0 % (ref 0.0–0.2)

## 2023-09-24 LAB — COMPREHENSIVE METABOLIC PANEL
ALT: 38 U/L (ref 0–44)
AST: 22 U/L (ref 15–41)
Albumin: 4.3 g/dL (ref 3.5–5.0)
Alkaline Phosphatase: 44 U/L (ref 38–126)
Anion gap: 10 (ref 5–15)
BUN: 21 mg/dL — ABNORMAL HIGH (ref 6–20)
CO2: 21 mmol/L — ABNORMAL LOW (ref 22–32)
Calcium: 9 mg/dL (ref 8.9–10.3)
Chloride: 101 mmol/L (ref 98–111)
Creatinine, Ser: 1.02 mg/dL (ref 0.61–1.24)
GFR, Estimated: >60 mL/min (ref >60)
Glucose, Bld: 106 mg/dL — ABNORMAL HIGH (ref 70–99)
Potassium: 3.7 mmol/L (ref 3.5–5.1)
Sodium: 132 mmol/L — ABNORMAL LOW (ref 135–145)
Total Bilirubin: 1.9 mg/dL — ABNORMAL HIGH (ref <1.2)
Total Protein: 7.6 g/dL (ref 6.5–8.1)

## 2023-09-24 LAB — URINALYSIS, ROUTINE W REFLEX MICROSCOPIC
Bilirubin Urine: NEGATIVE
Glucose, UA: NEGATIVE mg/dL
Hgb urine dipstick: NEGATIVE
Ketones, ur: NEGATIVE mg/dL
Leukocytes,Ua: NEGATIVE
Nitrite: NEGATIVE
Protein, ur: NEGATIVE mg/dL
Specific Gravity, Urine: 1.026 (ref 1.005–1.030)
pH: 6 (ref 5.0–8.0)

## 2023-09-24 LAB — LACTIC ACID, PLASMA
Lactic Acid, Venous: 1.4 mmol/L (ref 0.5–1.9)
Lactic Acid, Venous: 1.3 mmol/L (ref 0.5–1.9)

## 2023-09-24 LAB — HIV ANTIBODY (ROUTINE TESTING W REFLEX): HIV Screen 4th Generation wRfx: NONREACTIVE

## 2023-09-24 LAB — LIPASE, BLOOD: Lipase: 26 U/L (ref 11–51)

## 2023-09-24 MED ORDER — ONDANSETRON HCL 4 MG/2ML IJ SOLN
4.0000 mg | Freq: Once | INTRAMUSCULAR | Status: AC
  Administered 2023-09-24: 4 mg via INTRAVENOUS
  Filled 2023-09-24: qty 2

## 2023-09-24 MED ORDER — ONDANSETRON HCL 4 MG PO TABS
4.0000 mg | ORAL_TABLET | Freq: Four times a day (QID) | ORAL | Status: DC | PRN
  Administered 2023-09-27: 4 mg via ORAL
  Filled 2023-09-24: qty 1

## 2023-09-24 MED ORDER — HYDRALAZINE HCL 20 MG/ML IJ SOLN: 5.0000 mg | Freq: Four times a day (QID) | INTRAMUSCULAR | Status: DC | PRN

## 2023-09-24 MED ORDER — FENTANYL CITRATE PF 50 MCG/ML IJ SOSY
12.5000 ug | PREFILLED_SYRINGE | INTRAMUSCULAR | Status: DC | PRN
  Administered 2023-09-24 – 2023-09-25 (×7): 50 ug via INTRAVENOUS
  Filled 2023-09-24 (×8): qty 1

## 2023-09-24 MED ORDER — ENOXAPARIN SODIUM 40 MG/0.4ML IJ SOSY
40.0000 mg | PREFILLED_SYRINGE | INTRAMUSCULAR | Status: DC
  Administered 2023-09-24 – 2023-09-29 (×6): 40 mg via SUBCUTANEOUS
  Filled 2023-09-24 (×6): qty 0.4

## 2023-09-24 MED ORDER — FENTANYL CITRATE PF 50 MCG/ML IJ SOSY
50.0000 ug | PREFILLED_SYRINGE | Freq: Once | INTRAMUSCULAR | Status: AC
  Administered 2023-09-24: 50 ug via INTRAVENOUS
  Filled 2023-09-24: qty 1

## 2023-09-24 MED ORDER — ACETAMINOPHEN 325 MG PO TABS
650.0000 mg | ORAL_TABLET | Freq: Four times a day (QID) | ORAL | Status: DC | PRN
Start: 2023-09-24 — End: 2023-09-25

## 2023-09-24 MED ORDER — ONDANSETRON HCL 4 MG/2ML IJ SOLN
4.0000 mg | Freq: Four times a day (QID) | INTRAMUSCULAR | Status: DC | PRN
  Administered 2023-09-24 – 2023-09-29 (×7): 4 mg via INTRAVENOUS
  Filled 2023-09-24 (×8): qty 2

## 2023-09-24 MED ORDER — MORPHINE SULFATE (PF) 4 MG/ML IV SOLN
4.0000 mg | Freq: Once | INTRAVENOUS | Status: AC
  Administered 2023-09-24: 4 mg via INTRAVENOUS
  Filled 2023-09-24: qty 1

## 2023-09-24 MED ORDER — ACETAMINOPHEN 650 MG RE SUPP
650.0000 mg | Freq: Four times a day (QID) | RECTAL | Status: DC | PRN
Start: 2023-09-24 — End: 2023-09-25

## 2023-09-24 MED ORDER — OXYCODONE HCL 5 MG PO TABS
5.0000 mg | ORAL_TABLET | ORAL | Status: DC | PRN
  Administered 2023-09-25 – 2023-09-28 (×4): 5 mg via ORAL
  Filled 2023-09-24 (×4): qty 1

## 2023-09-24 MED ORDER — TAMSULOSIN HCL 0.4 MG PO CAPS
0.4000 mg | ORAL_CAPSULE | Freq: Every evening | ORAL | Status: DC
  Administered 2023-09-24 – 2023-09-30 (×6): 0.4 mg via ORAL
  Filled 2023-09-24 (×7): qty 1

## 2023-09-24 MED ORDER — PANTOPRAZOLE SODIUM 20 MG PO TBEC
20.0000 mg | DELAYED_RELEASE_TABLET | Freq: Every evening | ORAL | Status: DC
  Administered 2023-09-24 – 2023-09-30 (×6): 20 mg via ORAL
  Filled 2023-09-24 (×8): qty 1

## 2023-09-24 MED ORDER — PIPERACILLIN-TAZOBACTAM 3.375 G IVPB
3.3750 g | Freq: Three times a day (TID) | INTRAVENOUS | Status: DC
  Administered 2023-09-24 – 2023-10-01 (×20): 3.375 g via INTRAVENOUS
  Filled 2023-09-24 (×20): qty 50

## 2023-09-24 MED ORDER — PIPERACILLIN-TAZOBACTAM 3.375 G IVPB 30 MIN
3.3750 g | Freq: Once | INTRAVENOUS | Status: AC
  Administered 2023-09-24: 3.375 g via INTRAVENOUS
  Filled 2023-09-24: qty 50

## 2023-09-24 MED ORDER — LACTATED RINGERS IV BOLUS
1000.0000 mL | Freq: Once | INTRAVENOUS | Status: AC
  Administered 2023-09-24: 1000 mL via INTRAVENOUS

## 2023-09-24 MED ORDER — IOHEXOL 300 MG/ML  SOLN
100.0000 mL | Freq: Once | INTRAMUSCULAR | Status: AC | PRN
  Administered 2023-09-24: 100 mL via INTRAVENOUS

## 2023-09-24 MED ORDER — ALBUTEROL SULFATE (2.5 MG/3ML) 0.083% IN NEBU: 2.5000 mg | INHALATION_SOLUTION | RESPIRATORY_TRACT | Status: DC | PRN

## 2023-09-24 MED ORDER — SODIUM CHLORIDE 0.9 % IV SOLN: INTRAVENOUS | Status: AC

## 2023-09-24 NOTE — Consult Note (Signed)
Nila Nephew 07/28/1966  [id number].    Requesting MD: Dr. Linwood Dibbles Chief Complaint/Reason for Consult: diverticulitis with microperf  HPI:  This is a very pleasant 57 yo white male with a history of BPH, GERD, and recurrent diverticulitis who developed an acute episode of RLQ abdominal pain yesterday at 1030am while at work.  He has 2-3 episodes of diverticulitis at least per year and Dr. Loreta Ave, his GI doctor, has Cipro/Flagyl ordered for him yearly to take when he develops an episode.  This episode was more intense than his previous episodes.  He developed one episode of emesis after taking his first dose of Cipro yesterday.  He admits to a small amount of diarrhea and chills, but no fevers, CP, SOB, etc.  His last colonoscopy was 8 years ago when he was 50.  He is not due til he is 73.  His pain seems improved some today, but due to persistent pain and this being his worse episode yet, he presented to the ED for evaluation.  His WBC is 20K and his CT scan shows diverticulitis with a small amount of pneumoperitoneum consistent with perforation.  In review of this, it appears minimal and likely a result of a microperforation that has sealed.  His vitals are normal.  We have been asked to see for evaluation.  ROS: ROS: please see HPI  History reviewed. No pertinent family history.  Past Medical History:  Diagnosis Date   Fatty liver    GERD (gastroesophageal reflux disease)     Past Surgical History:  Procedure Laterality Date   APPENDECTOMY     CHOLECYSTECTOMY N/A 04/17/2021   Procedure: LAPAROSCOPIC CHOLECYSTECTOMY;  Surgeon: Abigail Miyamoto, MD;  Location: [facility];  Service: General;  Laterality: N/A;   COLONOSCOPY     FINGER SURGERY Left    ring and middle fingers reatttachment after amputation   WISDOM TOOTH EXTRACTION      Social History:  reports that he has never smoked. He has never used smokeless tobacco. He reports that he does not drink alcohol and does not use  drugs.  Allergies: No Known Allergies  (Not in a hospital admission)    Physical Exam: Blood pressure (!) 151/98, pulse 92, temperature 97.8 F (36.6 C), resp. rate 16, height 5\' 9"  (1.753 m), weight 83.9 kg, SpO2 97%. General: pleasant, WD, WN white male who is laying in bed in NAD HEENT: head is normocephalic, atraumatic.  Sclera are noninjected.  PERRL.  Ears and nose without any masses or lesions.  Mouth is pink and moist Heart: regular, rate, and rhythm.  Normal s1,s2. No obvious murmurs, gallops, or rubs noted.  Palpable radial and pedal pulses bilaterally Lungs: CTAB, no wheezes, rhonchi, or rales noted.  Respiratory effort nonlabored Abd: soft, mild tenderness in RLQ (has had morphine a bit ago), but no peritonitis, guarding, or rebound, ND, +BS, no masses, hernias, or organomegaly.  Scars from previous lap chole/appy noted. MS: all 4 extremities are symmetrical with no cyanosis, clubbing, or edema. Psych: A&Ox3 with an appropriate affect.   Results for orders placed or performed during the hospital encounter of 09/24/23 (from the past 48 hour(s))  Lipase, blood     Status: None   Collection Time: 09/24/23 11:22 AM  Result Value Ref Range   Lipase 26 11 - 51 U/L    Comment: Performed at [facility], [address] [place], [place] [address]  Comprehensive metabolic panel     Status: Abnormal  Collection Time: 09/24/23 11:22 AM  Result Value Ref Range   Sodium 132 (L) 135 - 145 mmol/L   Potassium 3.7 3.5 - 5.1 mmol/L   Chloride 101 98 - 111 mmol/L   CO2 21 (L) 22 - 32 mmol/L   Glucose, Bld 106 (H) 70 - 99 mg/dL    Comment: Glucose reference range applies only to samples taken after fasting for at least 8 hours.   BUN 21 (H) 6 - 20 mg/dL   Creatinine, Ser 1.61 0.61 - 1.24 mg/dL   Calcium 9.0 8.9 - 09.6 mg/dL   Total Protein 7.6 6.5 - 8.1 g/dL   Albumin 4.3 3.5 - 5.0 g/dL   AST 22 15 - 41 U/L   ALT 38 0 - 44 U/L   Alkaline Phosphatase 44 38 - 126  U/L   Total Bilirubin 1.9 (H) <1.2 mg/dL   GFR, Estimated >04 >54 mL/min    Comment: (NOTE) Calculated using the CKD-EPI Creatinine Equation (2021)    Anion gap 10 5 - 15    Comment: Performed at [facility], [address] [place], [place] [address]  CBC with Differential     Status: Abnormal   Collection Time: 09/24/23 11:22 AM  Result Value Ref Range   WBC 20.0 (H) 4.0 - 10.5 K/uL   RBC 4.90 4.22 - 5.81 MIL/uL   Hemoglobin 15.1 13.0 - 17.0 g/dL   HCT 91.4 78.2 - 95.6 %   MCV 89.6 80.0 - 100.0 fL   MCH 30.8 26.0 - 34.0 pg   MCHC 34.4 30.0 - 36.0 g/dL   RDW 21.3 08.6 - 57.8 %   Platelets 236 150 - 400 K/uL   nRBC 0.0 0.0 - 0.2 %   Neutrophils Relative % 87 %   Neutro Abs 17.5 (H) 1.7 - 7.7 K/uL   Lymphocytes Relative 7 %   Lymphs Abs 1.3 0.7 - 4.0 K/uL   Monocytes Relative 5 %   Monocytes Absolute 1.0 0.1 - 1.0 K/uL   Eosinophils Relative 0 %   Eosinophils Absolute 0.0 0.0 - 0.5 K/uL   Basophils Relative 0 %   Basophils Absolute 0.0 0.0 - 0.1 K/uL   Immature Granulocytes 1 %   Abs Immature Granulocytes 0.15 (H) 0.00 - 0.07 K/uL    Comment: Performed at [facility], [address], [place], [place] [address]  Urinalysis, Routine w reflex microscopic -Urine, Clean Catch     Status: None   Collection Time: 09/24/23 12:11 PM  Result Value Ref Range   Color, Urine YELLOW YELLOW   APPearance CLEAR CLEAR   Specific Gravity, Urine 1.026 1.005 - 1.030   pH 6.0 5.0 - 8.0   Glucose, UA NEGATIVE NEGATIVE mg/dL   Hgb urine dipstick NEGATIVE NEGATIVE   Bilirubin Urine NEGATIVE NEGATIVE   Ketones, ur NEGATIVE NEGATIVE mg/dL   Protein, ur NEGATIVE NEGATIVE mg/dL   Nitrite NEGATIVE NEGATIVE   Leukocytes,Ua NEGATIVE NEGATIVE    Comment: Performed at [facility], [address] [place], [place] [address]   CT ABDOMEN PELVIS W CONTRAST  Result Date: 09/24/2023 CLINICAL DATA:  Acute generalized abdominal pain. EXAM: CT  ABDOMEN AND PELVIS WITH CONTRAST TECHNIQUE: Multidetector CT imaging of the abdomen and pelvis was performed using the standard protocol following bolus administration of intravenous contrast. RADIATION DOSE REDUCTION: This exam was performed according to the departmental dose-optimization program which includes automated exposure control, adjustment of the mA and/or kV according to patient size and/or  use of iterative reconstruction technique. CONTRAST:  OMNIPAQUE IOHEXOL 300 MG/ML  SOLN COMPARISON:  February 12, 2021. FINDINGS: Lower chest: Mild bibasilar subsegmental atelectasis is noted. Hepatobiliary: Hepatic steatosis. Status post cholecystectomy. No biliary dilatation. Pancreas: Unremarkable. No pancreatic ductal dilatation or surrounding inflammatory changes. Spleen: Normal in size without focal abnormality. Adrenals/Urinary Tract: Adrenal glands appear normal. Bilateral renal cysts are noted for which no further follow-up is required. No hydronephrosis or renal obstruction is noted. Urinary bladder is unremarkable. Stomach/Bowel: Status post appendectomy. Stomach is unremarkable. Mildly dilated small bowel loops are noted most likely representing ileus secondary to inflammation. Moderate sigmoid diverticulitis is noted with adjacent air in the peritoneal fat consistent with small perforation. Free air is also noted in the epigastric region. Wall thickening of small bowel loop is seen in the right lower quadrant most likely due to secondary inflammation. Vascular/Lymphatic: No significant vascular findings are present. No enlarged abdominal or pelvic lymph nodes. Reproductive: Stable mild prostatic enlargement. Other: No abdominal wall hernia or abnormality. No abdominopelvic ascites. Musculoskeletal: No acute or significant osseous findings. IMPRESSION: Findings consistent with moderate diverticulitis with adjacent free air consistent with perforation. Free air is also noted in the epigastric region.  Wall thickening of adjacent small bowel loop is noted in the pelvis representing secondary inflammation. Mildly dilated small bowel loops are noted more superiorly most likely due to inflammatory ileus. Critical Value/emergent results were called by telephone at the time of interpretation on 09/24/2023 at 1:08 pm to provider AMJAD ALI , who verbally acknowledged these results. Hepatic steatosis. Stable mild prostatic enlargement. Mild bibasilar subsegmental atelectasis. Electronically Signed   By: Lupita Raider M.D.   On: 09/24/2023 13:08      Assessment/Plan Diverticulitis with microperforation The patient has been seen, examined, labs, chart, vitals, and imaging personally reviewed.  The patient appears to have diverticulitis with a perforation that is contained as he does not have that much free air.  There is no evidence of developing abscess or phlegmon.  He is clinically well-appearing and his vitals are stable.  Agree with medical admission for conservative management with IV abx therapy and bowel rest initially.  We did discussed he need a new colonoscopy and then would likely benefit from seeing one of our colorectal surgeons to discuss and elective sigmoid colectomy given the numerous episodes of diverticulitis that he has been having for years.  I do not see an indication for any surgical intervention at this time, but we did discuss that if he fails to improve, then he could required a colectomy/colostomy this admission.  He understands.  We will continue to follow along.    FEN - NPO/IVFs VTE - ok for chemical prophylaxis from our standpoint ID - [email or web address]  GERD BPH  I reviewed nursing notes, ED provider notes, last 24 h vitals and pain scores, last 48 h intake and output, last 24 h labs and trends, and last 24 h imaging results.  Letha Cape, [facility] 09/24/2023, 1:35 PM Please see Amion for pager number during day hours 7:00am-4:30pm or 7:00am -11:30am on  weekends

## 2023-09-24 NOTE — ED Triage Notes (Signed)
Pt complaining of abd pain that began yesterday. Pt does endorse some nausea and diarrhea. Pt has hx of diverticulitis, pt did take flagyl, and cipro.

## 2023-09-24 NOTE — H&P (Signed)
History and Physical  Henry Paul AOZ:[id number] DOB: 1966-01-22 DOA: 09/24/2023  PCP: [name], MD   Chief Complaint: Abdominal pain  HPI: Henry Paul is a 57 y.o. male with medical history significant for GERD, hypertension, diverticulitis being admitted to the hospital with acute diverticulitis with microperforation.  Patient states he has frequent bouts of diverticulitis and has a supply of Cipro and Flagyl at home provided by his gastroenterologist Dr. Loreta Ave.  2 days ago, he had a little bit of diarrhea, and starting yesterday he had some abdominal pain which has become progressively worse.  Yesterday he started oral Cipro and Flagyl, though he did spit up the ciprofloxacin after he took it due to some nausea.  Denies any fevers, maybe had some subjective chills.  Today his pain was even worse and his wife was worried about he looked so he came to the ER for evaluation.  Here in the ER, he was noted to have significant leukocytosis, and CT scan with diverticulitis and a small pneumoperitoneum.  He was seen in consultation by general surgery, is being admitted for IV antibiotics and close monitoring to the hospitalist service.  Review of Systems: Please see HPI for pertinent positives and negatives. A complete 10 system review of systems are otherwise negative.  Past Medical History:  Diagnosis Date   Fatty liver    GERD (gastroesophageal reflux disease)    Past Surgical History:  Procedure Laterality Date   APPENDECTOMY     CHOLECYSTECTOMY N/A 04/17/2021   Procedure: LAPAROSCOPIC CHOLECYSTECTOMY;  Surgeon: Abigail Miyamoto, MD;  Location: [facility];  Service: General;  Laterality: N/A;   COLONOSCOPY     FINGER SURGERY Left    ring and middle fingers reatttachment after amputation   WISDOM TOOTH EXTRACTION      Social History:  reports that he has never smoked. He has never used smokeless tobacco. He reports that he does not drink alcohol and does not use drugs.   No  Known Allergies  History reviewed. No pertinent family history.   Prior to Admission medications   Medication Sig Start Date End Date Taking? Authorizing Provider  acetaminophen (TYLENOL) 500 MG tablet Take 1,000 mg by mouth every 6 (six) hours as needed for mild pain (pain score 1-3) or moderate pain (pain score 4-6).   Yes [provider]  ciprofloxacin (CIPRO) 500 MG tablet Take 500 mg by mouth 2 (two) times daily. 04/24/22  Yes [provider]  metroNIDAZOLE (FLAGYL) 250 MG tablet Take 250 mg by mouth 3 (three) times daily. 04/27/22  Yes [provider]  pantoprazole (PROTONIX) 20 MG tablet Take 20 mg by mouth every evening. 04/02/21  Yes [provider]  tamsulosin (FLOMAX) 0.4 MG CAPS capsule Take 0.4 mg by mouth every evening. 03/24/21  Yes [provider]    Physical Exam: BP (!) 151/98 (BP Location: Left Arm)   Pulse 92   Temp 97.8 F (36.6 C)   Resp 16   Ht 5\' 9"  (1.753 m)   Wt 83.9 kg   SpO2 97%   BMI 27.32 kg/m   General:  Alert, oriented, calm, in no acute distress, he looks very comfortable and nontoxic, his wife is at the bedside Eyes: EOMI, clear conjuctivae, white sclerea Neck: supple, no masses, trachea mildline  Cardiovascular: RRR, no murmurs or rubs, no peripheral edema  Respiratory: clear to auscultation bilaterally, no wheezes, no crackles  Abdomen: soft, tender, nondistended, normal bowel tones heard  Skin: dry, no rashes  Musculoskeletal: no joint effusions, normal range of motion  Psychiatric: appropriate affect, normal speech  Neurologic: extraocular muscles intact, clear speech, moving all extremities with intact sensorium         Labs on Admission:  Basic Metabolic Panel: Recent Labs  Lab 09/24/23 1122  NA 132*  K 3.7  CL 101  CO2 21*  GLUCOSE 106*  BUN 21*  CREATININE 1.02  CALCIUM 9.0   Liver Function Tests: Recent Labs  Lab 09/24/23 1122  AST 22  ALT 38  ALKPHOS 44  BILITOT 1.9*  PROT  7.6  ALBUMIN 4.3   Recent Labs  Lab 09/24/23 1122  LIPASE 26   No results for input(s): "AMMONIA" in the last 168 hours. CBC: Recent Labs  Lab 09/24/23 1122  WBC 20.0*  NEUTROABS 17.5*  HGB 15.1  HCT 43.9  MCV 89.6  PLT 236   Cardiac Enzymes: No results for input(s): "CKTOTAL", "CKMB", "CKMBINDEX", "TROPONINI" in the last 168 hours.  BNP (last 3 results) No results for input(s): "BNP" in the last 8760 hours.  ProBNP (last 3 results) No results for input(s): "PROBNP" in the last 8760 hours.  CBG: No results for input(s): "GLUCAP" in the last 168 hours.  Radiological Exams on Admission: CT ABDOMEN PELVIS W CONTRAST  Result Date: 09/24/2023 CLINICAL DATA:  Acute generalized abdominal pain. EXAM: CT ABDOMEN AND PELVIS WITH CONTRAST TECHNIQUE: Multidetector CT imaging of the abdomen and pelvis was performed using the standard protocol following bolus administration of intravenous contrast. RADIATION DOSE REDUCTION: This exam was performed according to the departmental dose-optimization program which includes automated exposure control, adjustment of the mA and/or kV according to patient size and/or use of iterative reconstruction technique. CONTRAST:  OMNIPAQUE IOHEXOL 300 MG/ML  SOLN COMPARISON:  February 12, 2021. FINDINGS: Lower chest: Mild bibasilar subsegmental atelectasis is noted. Hepatobiliary: Hepatic steatosis. Status post cholecystectomy. No biliary dilatation. Pancreas: Unremarkable. No pancreatic ductal dilatation or surrounding inflammatory changes. Spleen: Normal in size without focal abnormality. Adrenals/Urinary Tract: Adrenal glands appear normal. Bilateral renal cysts are noted for which no further follow-up is required. No hydronephrosis or renal obstruction is noted. Urinary bladder is unremarkable. Stomach/Bowel: Status post appendectomy. Stomach is unremarkable. Mildly dilated small bowel loops are noted most likely representing ileus secondary to  inflammation. Moderate sigmoid diverticulitis is noted with adjacent air in the peritoneal fat consistent with small perforation. Free air is also noted in the epigastric region. Wall thickening of small bowel loop is seen in the right lower quadrant most likely due to secondary inflammation. Vascular/Lymphatic: No significant vascular findings are present. No enlarged abdominal or pelvic lymph nodes. Reproductive: Stable mild prostatic enlargement. Other: No abdominal wall hernia or abnormality. No abdominopelvic ascites. Musculoskeletal: No acute or significant osseous findings. IMPRESSION: Findings consistent with moderate diverticulitis with adjacent free air consistent with perforation. Free air is also noted in the epigastric region. Wall thickening of adjacent small bowel loop is noted in the pelvis representing secondary inflammation. Mildly dilated small bowel loops are noted more superiorly most likely due to inflammatory ileus. Critical Value/emergent results were called by telephone at the time of interpretation on 09/24/2023 at 1:08 pm to provider AMJAD ALI , who verbally acknowledged these results. Hepatic steatosis. Stable mild prostatic enlargement. Mild bibasilar subsegmental atelectasis. Electronically Signed   By: Lupita Raider M.D.   On: 09/24/2023 13:08    Assessment/Plan Henry Paul is a 57 y.o. male with medical history significant for GERD, hypertension, diverticulitis being admitted to the  hospital with acute diverticulitis with microperforation.    Sepsis-meeting criteria with tachycardia, leukocytosis due to acute diverticulitis.  He is hemodynamically stable with normal blood pressure. -Inpatient admission -Treat acute diverticulitis as below -Check lactic acid now, and trend  Acute diverticulitis with evidence of microperforation -Empiric IV Zosyn -N.p.o. except for ice chips -Appreciate general surgery consultation and management -Pain and nausea control as  needed  GERD-p.o. PPI every evening  BPH-Flomax  DVT prophylaxis: Lovenox     Code Status: Full Code  Consults called: General Surgery  Admission status: The appropriate patient status for this patient is INPATIENT. Inpatient status is judged to be reasonable and necessary in order to provide the required intensity of service to ensure the patient's safety. The patient's presenting symptoms, physical exam findings, and initial radiographic and laboratory data in the context of their chronic comorbidities is felt to place them at high risk for further clinical deterioration. Furthermore, it is not anticipated that the patient will be medically stable for discharge from the hospital within 2 midnights of admission.    I certify that at the point of admission it is my clinical judgment that the patient will require inpatient hospital care spanning beyond 2 midnights from the point of admission due to high intensity of service, high risk for further deterioration and high frequency of surveillance required  Time spent: 59 minutes  Anselm Aumiller Sharlette Dense MD [facility] Pager [phone]  If 7PM-7AM, please contact night-coverage [email or web address] Password [facility]  09/24/2023, 1:58 PM

## 2023-09-24 NOTE — Progress Notes (Signed)
Pharmacy Antibiotic Note  Henry Paul is a 57 y.o. male admitted on 09/24/2023 with  IAI .  Pharmacy has been consulted for Zosyn dosing.  Plan: Zosyn 3.375g IV q8h (4 hour infusion).  Height: 5\' 9"  (175.3 cm) Weight: 83.9 kg (185 lb) IBW/kg (Calculated) : 70.7  Temp (24hrs), Avg:97.8 F (36.6 C), Min:97.8 F (36.6 C), Max:97.8 F (36.6 C)  Recent Labs  Lab 09/24/23 1122  WBC 20.0*  CREATININE 1.02    Estimated Creatinine Clearance: 79.9 mL/min (by C-G formula based on SCr of 1.02 mg/dL).    No Known Allergies   Dosage will likely remain stable at above dosage and need for further dosage adjustment appears unlikely at present.    Will sign off at this time.  Please reconsult if a change in clinical status warrants re-evaluation of dosage.     Adalberto Cole, PharmD, BCPS 09/24/2023 2:30 PM

## 2023-09-24 NOTE — ED Provider Notes (Signed)
[facility]ERGENCY DEPARTMENT AT [facility] Provider Note   CSN: [id number] Arrival date & time: 09/24/23  1035     History  Chief Complaint  Patient presents with   Abdominal Pain    Henry Paul is a 57 y.o. male.  57 year old male presents today for concern of lower abdominal pain.  Started yesterday.  He reached out to his gastroenterologist and was started on Cipro and Flagyl given that he has history of diverticulitis.  Endorses some constipation but was able to have a bowel movement yesterday.  No blood per rectum.  Without nausea, vomiting, dysuria or other complaints.  He states it is unusual for the pain to set him as quick as it did but this episode.  The history is provided by the patient. No language interpreter was used.       Home Medications Prior to Admission medications   Medication Sig Start Date End Date Taking? Authorizing Provider  budesonide-formoterol (SYMBICORT) 160-4.5 MCG/ACT inhaler SMARTSIG:2 Puff(s) By Mouth Twice Daily 09/01/22   [provider]  ciprofloxacin (CIPRO) 500 MG tablet SMARTSIG:1 Tablet(s) By Mouth Every 12 Hours Patient not taking: Reported on 09/07/2022 04/24/22   [provider]  guaiFENesin (MUCINEX) 600 MG 12 hr tablet Take by mouth 2 (two) times daily as needed.    [provider]  metroNIDAZOLE (FLAGYL) 250 MG tablet Take 250 mg by mouth 3 (three) times daily. Patient not taking: Reported on 09/07/2022 04/27/22   [provider]  oxyCODONE (OXY IR/ROXICODONE) 5 MG immediate release tablet Take 1 tablet (5 mg total) by mouth every 6 (six) hours as needed for moderate pain or severe pain. Patient not taking: Reported on 08/20/2022 04/17/21   Abigail Miyamoto, MD  pantoprazole (PROTONIX) 20 MG tablet Take 20 mg by mouth every evening. 04/02/21   [provider]  tamsulosin (FLOMAX) 0.4 MG CAPS capsule Take 0.4 mg by mouth every evening. 03/24/21   [provider]       Allergies    Patient has no known allergies.    Review of Systems   Review of Systems  Constitutional:  Negative for chills and fever.  Respiratory:  Negative for shortness of breath.   Cardiovascular:  Negative for chest pain.  Gastrointestinal:  Positive for abdominal pain and constipation. Negative for blood in stool, nausea and vomiting.  Genitourinary:  Negative for difficulty urinating and dysuria.  All other systems reviewed and are negative.   Physical Exam Updated Vital Signs BP (!) 151/98 (BP Location: Left Arm)   Pulse 92   Temp 97.8 F (36.6 C)   Resp 16   Ht 5\' 9"  (1.753 m)   Wt 83.9 kg   SpO2 97%   BMI 27.32 kg/m  Physical Exam Vitals and nursing note reviewed.  Constitutional:      General: He is not in acute distress.    Appearance: Normal appearance. He is not ill-appearing.  HENT:     Head: Normocephalic and atraumatic.     Nose: Nose normal.  Eyes:     General: No scleral icterus.    Extraocular Movements: Extraocular movements intact.     Conjunctiva/sclera: Conjunctivae normal.  Cardiovascular:     Rate and Rhythm: Normal rate and regular rhythm.     Pulses: Normal pulses.     Heart sounds: Normal heart sounds.  Pulmonary:     Effort: Pulmonary effort is normal. No respiratory distress.     Breath sounds: Normal breath sounds. No  wheezing or rales.  Abdominal:     General: There is no distension.     Palpations: Abdomen is soft.     Tenderness: There is abdominal tenderness. There is no right CVA tenderness, left CVA tenderness, guarding or rebound.  Musculoskeletal:        General: Normal range of motion.     Cervical back: Normal range of motion.  Skin:    General: Skin is warm and dry.  Neurological:     General: No focal deficit present.     Mental Status: He is alert. Mental status is at baseline.     ED Results / Procedures / Treatments   Labs (all labs ordered are listed, but only abnormal results are displayed) Labs  Reviewed  LIPASE, BLOOD  COMPREHENSIVE METABOLIC PANEL  URINALYSIS, ROUTINE W REFLEX MICROSCOPIC  CBC WITH DIFFERENTIAL/PLATELET    EKG None  Radiology No results found.  Procedures .Critical Care  Performed by: Marita Kansas, PA-C Authorized by: Marita Kansas, PA-C   Critical care provider statement:    Critical care time (minutes):  32   Critical care was necessary to treat or prevent imminent or life-threatening deterioration of the following conditions: perforated diverticulitis.   Critical care was time spent personally by me on the following activities:  Development of treatment plan with patient or surrogate, discussions with consultants, evaluation of patient's response to treatment, examination of patient, ordering and review of laboratory studies, ordering and review of radiographic studies, ordering and performing treatments and interventions, pulse oximetry, re-evaluation of patient's condition and review of old charts   Care discussed with: admitting provider       Medications Ordered in ED Medications  morphine (PF) 4 MG/ML injection 4 mg (4 mg Intravenous Given 09/24/23 1130)  lactated ringers bolus 1,000 mL (1,000 mLs Intravenous New Bag/Given 09/24/23 1131)  ondansetron (ZOFRAN) injection 4 mg (4 mg Intravenous Given 09/24/23 1131)    ED Course/ Medical Decision Making/ A&P                                 Medical Decision Making Amount and/or Complexity of Data Reviewed Labs: ordered. Radiology: ordered.  Risk Prescription drug management.   Medical Decision Making / ED Course   This patient presents to the ED for concern of abdominal pain, this involves an extensive number of treatment options, and is a complaint that carries with it a high risk of complications and morbidity.  The differential diagnosis includes diverticulitis, peritonitis, colitis,  MDM: 57 year old male with past medical history significant for diverticulitis presents today for  concern of abdominal pain that is worsened.  Initially started yesterday.  Reached out to his gastroenterologist and was started on Cipro and Flagyl.  CBC reveals a leukocytosis of 20,000 with left shift.  No anemia.  CMP with preserved renal function, sodium of 132 otherwise no acute concern.  UA without evidence of UTI.  Lipase within normal.  CT abdomen pelvis obtained shows concern for perforated diverticulitis.  Discussed with surgery.  They recommend medicine admission and IV antibiotics.  No indication for surgical intervention at this time.  Discussed with hospitalist.  He will evaluate patient for admission.   Lab Tests: -I ordered, reviewed, and interpreted labs.   The pertinent results include:   Labs Reviewed  COMPREHENSIVE METABOLIC PANEL - Abnormal; Notable for the following components:      Result Value   Sodium 132 (*)  CO2 21 (*)    Glucose, Bld 106 (*)    BUN 21 (*)    Total Bilirubin 1.9 (*)    All other components within normal limits  CBC WITH DIFFERENTIAL/PLATELET - Abnormal; Notable for the following components:   WBC 20.0 (*)    Neutro Abs 17.5 (*)    Abs Immature Granulocytes 0.15 (*)    All other components within normal limits  LIPASE, BLOOD  URINALYSIS, ROUTINE W REFLEX MICROSCOPIC      EKG  EKG Interpretation Date/Time:    Ventricular Rate:    PR Interval:    QRS Duration:    QT Interval:    QTC Calculation:   R Axis:      Text Interpretation:           Imaging Studies ordered: I ordered imaging studies including CT abdomen pelvis with contrast I independently visualized and interpreted imaging. I agree with the radiologist interpretation   Medicines ordered and prescription drug management: Meds ordered this encounter  Medications   morphine (PF) 4 MG/ML injection 4 mg   lactated ringers bolus 1,000 mL   ondansetron (ZOFRAN) injection 4 mg   iohexol (OMNIPAQUE) 300 MG/ML solution 100 mL   fentaNYL (SUBLIMAZE) injection 50  mcg   piperacillin-tazobactam (ZOSYN) IVPB 3.375 g    Order Specific Question:   Antibiotic Indication:    Answer:   Intra-abdominal Infection    -I have reviewed the patients home medicines and have made adjustments as needed  Critical interventions Surgical consult, IV antibiotics  Consultations Obtained: I requested consultation with the general surgery,  and discussed lab and imaging findings as well as pertinent plan - they recommend: As above   Reevaluation: After the interventions noted above, I reevaluated the patient and found that they have :stayed the same  Co morbidities that complicate the patient evaluation  Past Medical History:  Diagnosis Date   Fatty liver    Gallstones    GERD (gastroesophageal reflux disease)    History of laparoscopic appendectomy       Dispostion: Discussed with hospitalist.  They will evaluate patient for admission.  Final Clinical Impression(s) / ED Diagnoses Final diagnoses:  Diverticulitis  Perforated sigmoid colon [facility])    Rx / DC Orders ED Discharge Orders     None         Marita Kansas, PA-C 09/24/23 1348    Linwood Dibbles, MD 09/24/23 1556

## 2023-09-25 DIAGNOSIS — N400 Enlarged prostate without lower urinary tract symptoms: Secondary | ICD-10-CM | POA: Insufficient documentation

## 2023-09-25 DIAGNOSIS — K5720 Diverticulitis of large intestine with perforation and abscess without bleeding: Secondary | ICD-10-CM | POA: Diagnosis not present

## 2023-09-25 DIAGNOSIS — A419 Sepsis, unspecified organism: Principal | ICD-10-CM

## 2023-09-25 DIAGNOSIS — K219 Gastro-esophageal reflux disease without esophagitis: Secondary | ICD-10-CM | POA: Insufficient documentation

## 2023-09-25 LAB — CBC
HCT: 48.5 % (ref 39.0–52.0)
Hemoglobin: 16.1 g/dL (ref 13.0–17.0)
MCH: 30.7 pg (ref 26.0–34.0)
MCHC: 33.2 g/dL (ref 30.0–36.0)
MCV: 92.6 fL (ref 80.0–100.0)
Platelets: 248 10*3/uL (ref 150–400)
RBC: 5.24 MIL/uL (ref 4.22–5.81)
RDW: 13.3 % (ref 11.5–15.5)
WBC: 20.2 10*3/uL — ABNORMAL HIGH (ref 4.0–10.5)
nRBC: 0 % (ref 0.0–0.2)

## 2023-09-25 LAB — BASIC METABOLIC PANEL
Anion gap: 10 (ref 5–15)
BUN: 26 mg/dL — ABNORMAL HIGH (ref 6–20)
CO2: 20 mmol/L — ABNORMAL LOW (ref 22–32)
Calcium: 9.1 mg/dL (ref 8.9–10.3)
Chloride: 103 mmol/L (ref 98–111)
Creatinine, Ser: 1.29 mg/dL — ABNORMAL HIGH (ref 0.61–1.24)
GFR, Estimated: >60 mL/min (ref >60)
Glucose, Bld: 126 mg/dL — ABNORMAL HIGH (ref 70–99)
Potassium: 3.9 mmol/L (ref 3.5–5.1)
Sodium: 133 mmol/L — ABNORMAL LOW (ref 135–145)

## 2023-09-25 MED ORDER — HYDROMORPHONE HCL 1 MG/ML IJ SOLN
0.5000 mg | INTRAMUSCULAR | Status: DC | PRN
  Administered 2023-09-25: 1 mg via INTRAVENOUS
  Administered 2023-09-25: 0.5 mg via INTRAVENOUS
  Administered 2023-09-25: 1 mg via INTRAVENOUS
  Administered 2023-09-25: 0.5 mg via INTRAVENOUS
  Administered 2023-09-26 – 2023-09-29 (×7): 1 mg via INTRAVENOUS
  Filled 2023-09-25 (×11): qty 1

## 2023-09-25 MED ORDER — ACETAMINOPHEN 500 MG PO TABS
1000.0000 mg | ORAL_TABLET | Freq: Four times a day (QID) | ORAL | Status: DC
  Administered 2023-09-25 – 2023-09-30 (×13): 1000 mg via ORAL
  Filled 2023-09-25 (×18): qty 2

## 2023-09-25 MED ORDER — PROCHLORPERAZINE EDISYLATE 10 MG/2ML IJ SOLN
5.0000 mg | Freq: Four times a day (QID) | INTRAMUSCULAR | Status: AC | PRN
  Administered 2023-09-25 (×2): 5 mg via INTRAVENOUS
  Filled 2023-09-25 (×2): qty 2

## 2023-09-25 MED ORDER — SODIUM CHLORIDE 0.9 % IV SOLN: INTRAVENOUS | Status: AC

## 2023-09-25 MED ORDER — METHOCARBAMOL 1000 MG/10ML IJ SOLN: 500.0000 mg | Freq: Four times a day (QID) | INTRAMUSCULAR | Status: DC | PRN

## 2023-09-25 MED ORDER — PROCHLORPERAZINE EDISYLATE 10 MG/2ML IJ SOLN
10.0000 mg | Freq: Four times a day (QID) | INTRAMUSCULAR | Status: DC | PRN
  Administered 2023-09-25 – 2023-09-29 (×3): 10 mg via INTRAVENOUS
  Filled 2023-09-25 (×3): qty 2

## 2023-09-25 NOTE — Plan of Care (Signed)
  Problem: Education: Goal: Knowledge of General Education information will improve Description: Including pain rating scale, medication(s)/side effects and non-pharmacologic comfort measures Outcome: Progressing   Problem: Clinical Measurements: Goal: Ability to maintain clinical measurements within normal limits will improve Outcome: Progressing   Problem: Pain Management: Goal: General experience of comfort will improve Outcome: Progressing   Problem: Safety: Goal: Ability to remain free from injury will improve Outcome: Progressing   Problem: Skin Integrity: Goal: Risk for impaired skin integrity will decrease Outcome: Progressing

## 2023-09-25 NOTE — Hospital Course (Signed)
Mr. Akel is a 57 year old male with PMH diverticulosis, GERD, hepatic steatosis who presented with abdominal pain.  He has approximately a couple episodes of diverticulitis yearly which is treated at home with antibiotics.  He says he has not been hospitalized for diverticulitis in approximately 5 years.  His last colonoscopy was about 8 years ago. After starting antibiotics at home he developed some chills but no fever and due to not feeling better, presented to the ER. CT abdomen/pelvis showed moderate diverticulitis with adjacent free air consistent with perforation.  He was started on Zosyn and general surgery was consulted.

## 2023-09-25 NOTE — Assessment & Plan Note (Signed)
-   Tachycardia, leukocytosis.  Underlying diverticulitis on admission - See separate problem

## 2023-09-25 NOTE — Progress Notes (Signed)
Subjective/Chief Complaint: Some more pain overnight with bloating and some nausea.  Small amounts of flatus, no bowel movement   Objective: Vital signs in last 24 hours: Temp:  [97.8 F (36.6 C)-100.2 F (37.9 C)] 98.1 F (36.7 C) (11/23 0443) Pulse Rate:  [87-107] 104 (11/23 0005) Resp:  [15-16] 16 (11/23 0443) BP: (127-151)/(83-98) 151/98 (11/23 0443) SpO2:  [94 %-98 %] 96 % (11/23 0443) Weight:  [83.9 kg] 83.9 kg (11/22 1041) Last BM Date : 09/24/23  Intake/Output from previous day: 11/22 0701 - 11/23 0700 In: -  Out: 150 [Urine:150] Intake/Output this shift: No intake/output data recorded.  Alert and well appearing Unlabored respirations Abd soft, mildly distended, mildly tender without guarding or peritoneal signs in R>L lower quadrants  Lab Results:  Recent Labs    09/24/23 1122 09/25/23 0701  WBC 20.0* 20.2*  HGB 15.1 16.1  HCT 43.9 48.5  PLT 236 248   BMET Recent Labs    09/24/23 1122 09/25/23 0701  NA 132* 133*  K 3.7 3.9  CL 101 103  CO2 21* 20*  GLUCOSE 106* 126*  BUN 21* 26*  CREATININE 1.02 1.29*  CALCIUM 9.0 9.1   PT/INR No results for input(s): "LABPROT", "INR" in the last 72 hours. ABG No results for input(s): "PHART", "HCO3" in the last 72 hours.  Invalid input(s): "PCO2", "PO2"  Studies/Results: CT ABDOMEN PELVIS W CONTRAST  Result Date: 09/24/2023 CLINICAL DATA:  Acute generalized abdominal pain. EXAM: CT ABDOMEN AND PELVIS WITH CONTRAST TECHNIQUE: Multidetector CT imaging of the abdomen and pelvis was performed using the standard protocol following bolus administration of intravenous contrast. RADIATION DOSE REDUCTION: This exam was performed according to the departmental dose-optimization program which includes automated exposure control, adjustment of the mA and/or kV according to patient size and/or use of iterative reconstruction technique. CONTRAST:  OMNIPAQUE IOHEXOL 300 MG/ML  SOLN COMPARISON:  February 12, 2021. FINDINGS:  Lower chest: Mild bibasilar subsegmental atelectasis is noted. Hepatobiliary: Hepatic steatosis. Status post cholecystectomy. No biliary dilatation. Pancreas: Unremarkable. No pancreatic ductal dilatation or surrounding inflammatory changes. Spleen: Normal in size without focal abnormality. Adrenals/Urinary Tract: Adrenal glands appear normal. Bilateral renal cysts are noted for which no further follow-up is required. No hydronephrosis or renal obstruction is noted. Urinary bladder is unremarkable. Stomach/Bowel: Status post appendectomy. Stomach is unremarkable. Mildly dilated small bowel loops are noted most likely representing ileus secondary to inflammation. Moderate sigmoid diverticulitis is noted with adjacent air in the peritoneal fat consistent with small perforation. Free air is also noted in the epigastric region. Wall thickening of small bowel loop is seen in the right lower quadrant most likely due to secondary inflammation. Vascular/Lymphatic: No significant vascular findings are present. No enlarged abdominal or pelvic lymph nodes. Reproductive: Stable mild prostatic enlargement. Other: No abdominal wall hernia or abnormality. No abdominopelvic ascites. Musculoskeletal: No acute or significant osseous findings. IMPRESSION: Findings consistent with moderate diverticulitis with adjacent free air consistent with perforation. Free air is also noted in the epigastric region. Wall thickening of adjacent small bowel loop is noted in the pelvis representing secondary inflammation. Mildly dilated small bowel loops are noted more superiorly most likely due to inflammatory ileus. Critical Value/emergent results were called by telephone at the time of interpretation on 09/24/2023 at 1:08 pm to provider AMJAD ALI , who verbally acknowledged these results. Hepatic steatosis. Stable mild prostatic enlargement. Mild bibasilar subsegmental atelectasis. Electronically Signed   By: Lupita Raider M.D.   On: 09/24/2023  13:08  Anti-infectives: Anti-infectives (From admission, onward)    Start     Dose/Rate Route Frequency Ordered Stop   09/24/23 2000  piperacillin-tazobactam (ZOSYN) IVPB 3.375 g        3.375 g 12.5 mL/hr over 240 Minutes Intravenous Every 8 hours 09/24/23 1430     09/24/23 1315  piperacillin-tazobactam (ZOSYN) IVPB 3.375 g        3.375 g 100 mL/hr over 30 Minutes Intravenous  Once 09/24/23 1312 09/24/23 1355       Assessment/Plan:  Diverticulitis with microperforation -Some increase in pain/nausea.  Tmax 100.2, leukocytosis stable at 20.2 -Mild AKI with creatinine 1.29 from 1.02 yesterday  Will continue bowel rest, broad-spectrum IV antibiotics, fluid resuscitation today.  If clinical picture worsens further tomorrow will need repeat imaging.   LOS: 1 day    Henry Paul 09/25/2023

## 2023-09-25 NOTE — Assessment & Plan Note (Signed)
- 

## 2023-09-25 NOTE — Plan of Care (Signed)
Problem: Education: Goal: Knowledge of General Education information will improve Description: Including pain rating scale, medication(s)/side effects and non-pharmacologic comfort measures Outcome: Progressing   Problem: Clinical Measurements: Goal: Ability to maintain clinical measurements within normal limits will improve Outcome: Progressing Goal: Will remain free from infection Outcome: Progressing Goal: Diagnostic test results will improve Outcome: Progressing Goal: Respiratory complications will improve Outcome: Progressing Goal: Cardiovascular complication will be avoided Outcome: Progressing   Problem: Activity: Goal: Risk for activity intolerance will decrease Outcome: Progressing   Problem: Elimination: Goal: Will not experience complications related to bowel motility Outcome: Progressing Goal: Will not experience complications related to urinary retention Outcome: Progressing   Problem: Pain Management: Goal: General experience of comfort will improve Outcome: Progressing

## 2023-09-25 NOTE — Progress Notes (Signed)
Progress Note    Henry Paul   ACZ:[id number]  DOB: May 16, 1966  DOA: 09/24/2023     1 PCP: [name], MD  Initial CC: abd pain  Hospital Course: Mr. Henry Paul is a 57 year old male with PMH diverticulosis, GERD, hepatic steatosis who presented with abdominal pain.  He has approximately a couple episodes of diverticulitis yearly which is treated at home with antibiotics.  He says he has not been hospitalized for diverticulitis in approximately 5 years.  His last colonoscopy was about 8 years ago. After starting antibiotics at home he developed some chills but no fever and due to not feeling better, presented to the ER. CT abdomen/pelvis showed moderate diverticulitis with adjacent free air consistent with perforation.  He was started on Zosyn and general surgery was consulted.  Interval History:  Admitted yesterday.  No significant changes.  Still having some abdominal bloating/distention and ongoing pain but controlled with medications.  Remains n.p.o. but has no appetite.  Has not had a bowel movement in a few days he says as well.  Assessment and Plan: * Perforation of sigmoid colon due to diverticulitis - CT on admission shows moderate sigmoid diverticulitis with small perforation and free air also noted in the epigastric region -Had been taking antibiotics at home for approximately 1 to 2 days with no improvement -General Surgery following, appreciate assistance - Continue Zosyn -Continue n.p.o. for now -Borderline fever, Tmax 100.2.  Leukocytosis stable but elevated, 20k  BPH (benign prostatic hyperplasia) - Continue Flomax  GERD (gastroesophageal reflux disease) - Continue Protonix  Sepsis (HCC)-resolved as of 09/25/2023 - Tachycardia, leukocytosis.  Underlying diverticulitis on admission - See separate problem   Old records reviewed in assessment of this patient  Antimicrobials: Zosyn 09/24/2023 >> current  DVT prophylaxis:  enoxaparin (LOVENOX) injection 40  mg Start: 09/24/23 2200 SCDs Start: 09/24/23 1357   Code Status:   Code Status: Full Code  Mobility Assessment (Last 72 Hours)     Mobility Assessment     Row Name 09/25/23 0845 09/24/23 1945 09/24/23 1624       Does patient have an order for bedrest or is patient medically unstable No - Continue assessment No - Continue assessment No - Continue assessment     What is the highest level of mobility based on the progressive mobility assessment? Level 6 (Walks independently in room and hall) - Balance while walking in room without assist - Complete Level 6 (Walks independently in room and hall) - Balance while walking in room without assist - Complete Level 6 (Walks independently in room and hall) - Balance while walking in room without assist - Complete              Barriers to discharge: none Disposition Plan:  Home  Status is: Inpt  Objective: Blood pressure (!) 151/98, pulse (!) 104, temperature 98.1 F (36.7 C), resp. rate 16, height 5\' 9"  (1.753 m), weight 83.9 kg, SpO2 96%.  Examination:  Physical Exam Constitutional:      General: He is not in acute distress.    Appearance: Normal appearance.  HENT:     Head: Normocephalic and atraumatic.     Mouth/Throat:     Mouth: Mucous membranes are moist.  Eyes:     Extraocular Movements: Extraocular movements intact.  Cardiovascular:     Rate and Rhythm: Normal rate and regular rhythm.  Pulmonary:     Effort: Pulmonary effort is normal. No respiratory distress.     Breath sounds: Normal breath sounds.  No wheezing.  Abdominal:     General: Bowel sounds are normal. There is distension (mild).     Palpations: Abdomen is soft.     Tenderness: There is abdominal tenderness.  Musculoskeletal:        General: Normal range of motion.     Cervical back: Normal range of motion and neck supple.  Skin:    General: Skin is warm and dry.  Neurological:     General: No focal deficit present.     Mental Status: He is alert.   Psychiatric:        Mood and Affect: Mood normal.        Behavior: Behavior normal.      Consultants:  General surgery  Procedures:    Data Reviewed: Results for orders placed or performed during the hospital encounter of 09/24/23 (from the past 24 hour(s))  Lactic acid, plasma     Status: None   Collection Time: 09/24/23  2:53 PM  Result Value Ref Range   Lactic Acid, Venous 1.4 0.5 - 1.9 mmol/L  HIV Antibody (routine testing w rflx)     Status: None   Collection Time: 09/24/23  2:53 PM  Result Value Ref Range   HIV Screen 4th Generation wRfx Non Reactive Non Reactive  Lactic acid, plasma     Status: None   Collection Time: 09/24/23  4:43 PM  Result Value Ref Range   Lactic Acid, Venous 1.3 0.5 - 1.9 mmol/L  Basic metabolic panel     Status: Abnormal   Collection Time: 09/25/23  7:01 AM  Result Value Ref Range   Sodium 133 (L) 135 - 145 mmol/L   Potassium 3.9 3.5 - 5.1 mmol/L   Chloride 103 98 - 111 mmol/L   CO2 20 (L) 22 - 32 mmol/L   Glucose, Bld 126 (H) 70 - 99 mg/dL   BUN 26 (H) 6 - 20 mg/dL   Creatinine, Ser 0.27 (H) 0.61 - 1.24 mg/dL   Calcium 9.1 8.9 - 25.3 mg/dL   GFR, Estimated >66 >44 mL/min   Anion gap 10 5 - 15  CBC     Status: Abnormal   Collection Time: 09/25/23  7:01 AM  Result Value Ref Range   WBC 20.2 (H) 4.0 - 10.5 K/uL   RBC 5.24 4.22 - 5.81 MIL/uL   Hemoglobin 16.1 13.0 - 17.0 g/dL   HCT 03.4 74.2 - 59.5 %   MCV 92.6 80.0 - 100.0 fL   MCH 30.7 26.0 - 34.0 pg   MCHC 33.2 30.0 - 36.0 g/dL   RDW 63.8 75.6 - 43.3 %   Platelets 248 150 - 400 K/uL   nRBC 0.0 0.0 - 0.2 %    I have reviewed pertinent nursing notes, vitals, labs, and images as necessary. I have ordered labwork to follow up on as indicated.  I have reviewed the last notes from staff over past 24 hours. I have discussed patient's care plan and test results with nursing staff, CM/SW, and other staff as appropriate.  Time spent: Greater than 50% of the 55 minute visit was spent in  counseling/coordination of care for the patient as laid out in the A&P.   LOS: 1 day   Lewie Chamber, MD [facility] 09/25/2023, 1:03 PM

## 2023-09-25 NOTE — Assessment & Plan Note (Signed)
-   Continue Flomax 

## 2023-09-25 NOTE — Assessment & Plan Note (Signed)
-   CT on admission shows moderate sigmoid diverticulitis with small perforation and free air also noted in the epigastric region -Had been taking antibiotics at home for approximately 1 to 2 days with no improvement -General Surgery following, appreciate assistance - Continue Zosyn -Some clinical improvement since yesterday.  Patient started on CLD per surgery today -Improvement in leukocytosis today noted

## 2023-09-26 DIAGNOSIS — K5720 Diverticulitis of large intestine with perforation and abscess without bleeding: Secondary | ICD-10-CM | POA: Diagnosis not present

## 2023-09-26 LAB — CBC WITH DIFFERENTIAL/PLATELET
Abs Immature Granulocytes: 0.09 10*3/uL — ABNORMAL HIGH (ref 0.00–0.07)
Basophils Absolute: 0 10*3/uL (ref 0.0–0.1)
Basophils Relative: 0 %
Eosinophils Absolute: 0 10*3/uL (ref 0.0–0.5)
Eosinophils Relative: 0 %
HCT: 43.3 % (ref 39.0–52.0)
Hemoglobin: 13.7 g/dL (ref 13.0–17.0)
Immature Granulocytes: 1 %
Lymphocytes Relative: 8 %
Lymphs Abs: 1.1 10*3/uL (ref 0.7–4.0)
MCH: 30.1 pg (ref 26.0–34.0)
MCHC: 31.6 g/dL (ref 30.0–36.0)
MCV: 95.2 fL (ref 80.0–100.0)
Monocytes Absolute: 1 10*3/uL (ref 0.1–1.0)
Monocytes Relative: 8 %
Neutro Abs: 10.8 10*3/uL — ABNORMAL HIGH (ref 1.7–7.7)
Neutrophils Relative %: 83 %
Platelets: 217 10*3/uL (ref 150–400)
RBC: 4.55 MIL/uL (ref 4.22–5.81)
RDW: 13.3 % (ref 11.5–15.5)
WBC: 13 10*3/uL — ABNORMAL HIGH (ref 4.0–10.5)
nRBC: 0 % (ref 0.0–0.2)

## 2023-09-26 LAB — BASIC METABOLIC PANEL
Anion gap: 6 (ref 5–15)
BUN: 29 mg/dL — ABNORMAL HIGH (ref 6–20)
CO2: 22 mmol/L (ref 22–32)
Calcium: 8.1 mg/dL — ABNORMAL LOW (ref 8.9–10.3)
Chloride: 106 mmol/L (ref 98–111)
Creatinine, Ser: 1.12 mg/dL (ref 0.61–1.24)
GFR, Estimated: >60 mL/min (ref >60)
Glucose, Bld: 101 mg/dL — ABNORMAL HIGH (ref 70–99)
Potassium: 4.2 mmol/L (ref 3.5–5.1)
Sodium: 134 mmol/L — ABNORMAL LOW (ref 135–145)

## 2023-09-26 LAB — MAGNESIUM: Magnesium: 2.5 mg/dL — ABNORMAL HIGH (ref 1.7–2.4)

## 2023-09-26 MED ORDER — LORATADINE 10 MG PO TABS
10.0000 mg | ORAL_TABLET | Freq: Every day | ORAL | Status: DC
  Administered 2023-09-26 – 2023-09-30 (×5): 10 mg via ORAL
  Filled 2023-09-26 (×5): qty 1

## 2023-09-26 MED ORDER — MELATONIN 5 MG PO TABS
5.0000 mg | ORAL_TABLET | Freq: Every evening | ORAL | Status: AC | PRN
  Administered 2023-09-26 – 2023-10-01 (×2): 5 mg via ORAL
  Filled 2023-09-26 (×2): qty 1

## 2023-09-26 MED ORDER — SODIUM CHLORIDE 0.9 % IV SOLN: INTRAVENOUS | Status: AC

## 2023-09-26 NOTE — Plan of Care (Signed)
  Problem: Education: Goal: Knowledge of General Education information will improve Description: Including pain rating scale, medication(s)/side effects and non-pharmacologic comfort measures Outcome: Progressing   Problem: Health Behavior/Discharge Planning: Goal: Ability to manage health-related needs will improve Outcome: Progressing   Problem: Clinical Measurements: Goal: Ability to maintain clinical measurements within normal limits will improve Outcome: Progressing Goal: Will remain free from infection Outcome: Progressing Goal: Diagnostic test results will improve Outcome: Progressing Goal: Respiratory complications will improve Outcome: Progressing Goal: Cardiovascular complication will be avoided Outcome: Progressing   Problem: Nutrition: Goal: Adequate nutrition will be maintained Outcome: Progressing   Problem: Pain Management: Goal: General experience of comfort will improve Outcome: Progressing

## 2023-09-26 NOTE — Plan of Care (Signed)
  Problem: Education: Goal: Knowledge of General Education information will improve Description: Including pain rating scale, medication(s)/side effects and non-pharmacologic comfort measures Outcome: Progressing   Problem: Clinical Measurements: Goal: Ability to maintain clinical measurements within normal limits will improve Outcome: Progressing   Problem: Pain Management: Goal: General experience of comfort will improve Outcome: Progressing   Problem: Safety: Goal: Ability to remain free from injury will improve Outcome: Progressing   Problem: Skin Integrity: Goal: Risk for impaired skin integrity will decrease Outcome: Progressing

## 2023-09-26 NOTE — Progress Notes (Signed)
Progress Note    Henry Paul   [id number]  DOB: 1966/10/29  DOA: 09/24/2023     2 PCP: [name], MD  Initial CC: abd pain  Hospital Course: Henry Paul is a 57 year old male with PMH diverticulosis, GERD, hepatic steatosis who presented with abdominal pain.  He has approximately a couple episodes of diverticulitis yearly which is treated at home with antibiotics.  He says he has not been hospitalized for diverticulitis in approximately 5 years.  His last colonoscopy was about 8 years ago. After starting antibiotics at home he developed some chills but no fever and due to not feeling better, presented to the ER. CT abdomen/pelvis showed moderate diverticulitis with adjacent free air consistent with perforation.  He was started on Zosyn and general surgery was consulted.  Interval History:  Some improvement in his abdominal distention and tenderness since yesterday.  Overall feeling a little better and less nauseous.  He is amenable for clear liquid diet trial today.  Assessment and Plan: * Perforation of sigmoid colon due to diverticulitis - CT on admission shows moderate sigmoid diverticulitis with small perforation and free air also noted in the epigastric region -Had been taking antibiotics at home for approximately 1 to 2 days with no improvement -General Surgery following, appreciate assistance - Continue Zosyn -Some clinical improvement since yesterday.  Patient started on CLD per surgery today -Improvement in leukocytosis today noted  BPH (benign prostatic hyperplasia) - Continue Flomax  GERD (gastroesophageal reflux disease) - Continue Protonix  Sepsis (HCC)-resolved as of 09/25/2023 - Tachycardia, leukocytosis.  Underlying diverticulitis on admission - See separate problem   Old records reviewed in assessment of this patient  Antimicrobials: Zosyn 09/24/2023 >> current  DVT prophylaxis:  enoxaparin (LOVENOX) injection 40 mg Start: 09/24/23 2200 SCDs  Start: 09/24/23 1357   Code Status:   Code Status: Full Code  Mobility Assessment (Last 72 Hours)     Mobility Assessment     Row Name 09/26/23 1025 09/25/23 2000 09/25/23 0845 09/24/23 1945 09/24/23 1624   Does patient have an order for bedrest or is patient medically unstable No - Continue assessment No - Continue assessment No - Continue assessment No - Continue assessment No - Continue assessment   What is the highest level of mobility based on the progressive mobility assessment? Level 6 (Walks independently in room and hall) - Balance while walking in room without assist - Complete Level 6 (Walks independently in room and hall) - Balance while walking in room without assist - Complete Level 6 (Walks independently in room and hall) - Balance while walking in room without assist - Complete Level 6 (Walks independently in room and hall) - Balance while walking in room without assist - Complete Level 6 (Walks independently in room and hall) - Balance while walking in room without assist - Complete            Barriers to discharge: none Disposition Plan:  Home  Status is: Inpt  Objective: Blood pressure (!) 143/93, pulse 98, temperature 98.3 F (36.8 C), temperature source Oral, resp. rate 20, height 5\' 9"  (1.753 m), weight 83.9 kg, SpO2 98%.  Examination:  Physical Exam Constitutional:      General: He is not in acute distress.    Appearance: Normal appearance.  HENT:     Head: Normocephalic and atraumatic.     Mouth/Throat:     Mouth: Mucous membranes are moist.  Eyes:     Extraocular Movements: Extraocular movements intact.  Cardiovascular:  Rate and Rhythm: Normal rate and regular rhythm.  Pulmonary:     Effort: Pulmonary effort is normal. No respiratory distress.     Breath sounds: Normal breath sounds. No wheezing.  Abdominal:     General: Bowel sounds are normal. There is distension (mild; Improved).     Palpations: Abdomen is soft.     Tenderness: There is  abdominal tenderness.  Musculoskeletal:        General: Normal range of motion.     Cervical back: Normal range of motion and neck supple.  Skin:    General: Skin is warm and dry.  Neurological:     General: No focal deficit present.     Mental Status: He is alert.  Psychiatric:        Mood and Affect: Mood normal.        Behavior: Behavior normal.      Consultants:  General surgery  Procedures:    Data Reviewed: Results for orders placed or performed during the hospital encounter of 09/24/23 (from the past 24 hour(s))  Basic metabolic panel     Status: Abnormal   Collection Time: 09/26/23  5:34 AM  Result Value Ref Range   Sodium 134 (L) 135 - 145 mmol/L   Potassium 4.2 3.5 - 5.1 mmol/L   Chloride 106 98 - 111 mmol/L   CO2 22 22 - 32 mmol/L   Glucose, Bld 101 (H) 70 - 99 mg/dL   BUN 29 (H) 6 - 20 mg/dL   Creatinine, Ser 1.61 0.61 - 1.24 mg/dL   Calcium 8.1 (L) 8.9 - 10.3 mg/dL   GFR, Estimated >09 >60 mL/min   Anion gap 6 5 - 15  Magnesium     Status: Abnormal   Collection Time: 09/26/23  5:34 AM  Result Value Ref Range   Magnesium 2.5 (H) 1.7 - 2.4 mg/dL  CBC with Differential/Platelet     Status: Abnormal   Collection Time: 09/26/23  5:34 AM  Result Value Ref Range   WBC 13.0 (H) 4.0 - 10.5 K/uL   RBC 4.55 4.22 - 5.81 MIL/uL   Hemoglobin 13.7 13.0 - 17.0 g/dL   HCT 45.4 09.8 - 11.9 %   MCV 95.2 80.0 - 100.0 fL   MCH 30.1 26.0 - 34.0 pg   MCHC 31.6 30.0 - 36.0 g/dL   RDW 14.7 82.9 - 56.2 %   Platelets 217 150 - 400 K/uL   nRBC 0.0 0.0 - 0.2 %   Neutrophils Relative % 83 %   Neutro Abs 10.8 (H) 1.7 - 7.7 K/uL   Lymphocytes Relative 8 %   Lymphs Abs 1.1 0.7 - 4.0 K/uL   Monocytes Relative 8 %   Monocytes Absolute 1.0 0.1 - 1.0 K/uL   Eosinophils Relative 0 %   Eosinophils Absolute 0.0 0.0 - 0.5 K/uL   Basophils Relative 0 %   Basophils Absolute 0.0 0.0 - 0.1 K/uL   Immature Granulocytes 1 %   Abs Immature Granulocytes 0.09 (H) 0.00 - 0.07 K/uL    I  have reviewed pertinent nursing notes, vitals, labs, and images as necessary. I have ordered labwork to follow up on as indicated.  I have reviewed the last notes from staff over past 24 hours. I have discussed patient's care plan and test results with nursing staff, CM/SW, and other staff as appropriate.  Time spent: Greater than 50% of the 55 minute visit was spent in counseling/coordination of care for the patient as laid out in the  A&P.   LOS: 2 days   Lewie Chamber, MD [facility] 09/26/2023, 4:24 PM

## 2023-09-26 NOTE — Progress Notes (Signed)
Subjective/Chief Complaint: Feeling much better today.  Had a good night last night.  Very minimal lower abdominal pain.  Flatus but no bowel movement.  No nausea.   Objective: Vital signs in last 24 hours: Temp:  [97.8 F (36.6 C)-98.5 F (36.9 C)] 98.5 F (36.9 C) (11/24 0436) Pulse Rate:  [93-109] 102 (11/24 0436) Resp:  [18-20] 18 (11/24 0436) BP: (129-141)/(91-101) 141/91 (11/24 0436) SpO2:  [94 %-96 %] 95 % (11/24 0436) Last BM Date : 09/24/23  Intake/Output from previous day: 11/23 0701 - 11/24 0700 In: 1015.1 [I.V.:878.5; IV Piggyback:136.6] Out: 525 [Urine:525] Intake/Output this shift: No intake/output data recorded.  Alert and well appearing Unlabored respirations Abd soft, minimally distended, minimally subjectively tender in the suprapubic region just to the right of midline with no peritoneal signs  Lab Results:  Recent Labs    09/25/23 0701 09/26/23 0534  WBC 20.2* 13.0*  HGB 16.1 13.7  HCT 48.5 43.3  PLT 248 217   BMET Recent Labs    09/25/23 0701 09/26/23 0534  NA 133* 134*  K 3.9 4.2  CL 103 106  CO2 20* 22  GLUCOSE 126* 101*  BUN 26* 29*  CREATININE 1.29* 1.12  CALCIUM 9.1 8.1*   PT/INR No results for input(s): "LABPROT", "INR" in the last 72 hours. ABG No results for input(s): "PHART", "HCO3" in the last 72 hours.  Invalid input(s): "PCO2", "PO2"  Studies/Results: CT ABDOMEN PELVIS W CONTRAST  Result Date: 09/24/2023 CLINICAL DATA:  Acute generalized abdominal pain. EXAM: CT ABDOMEN AND PELVIS WITH CONTRAST TECHNIQUE: Multidetector CT imaging of the abdomen and pelvis was performed using the standard protocol following bolus administration of intravenous contrast. RADIATION DOSE REDUCTION: This exam was performed according to the departmental dose-optimization program which includes automated exposure control, adjustment of the mA and/or kV according to patient size and/or use of iterative reconstruction technique. CONTRAST:   OMNIPAQUE IOHEXOL 300 MG/ML  SOLN COMPARISON:  February 12, 2021. FINDINGS: Lower chest: Mild bibasilar subsegmental atelectasis is noted. Hepatobiliary: Hepatic steatosis. Status post cholecystectomy. No biliary dilatation. Pancreas: Unremarkable. No pancreatic ductal dilatation or surrounding inflammatory changes. Spleen: Normal in size without focal abnormality. Adrenals/Urinary Tract: Adrenal glands appear normal. Bilateral renal cysts are noted for which no further follow-up is required. No hydronephrosis or renal obstruction is noted. Urinary bladder is unremarkable. Stomach/Bowel: Status post appendectomy. Stomach is unremarkable. Mildly dilated small bowel loops are noted most likely representing ileus secondary to inflammation. Moderate sigmoid diverticulitis is noted with adjacent air in the peritoneal fat consistent with small perforation. Free air is also noted in the epigastric region. Wall thickening of small bowel loop is seen in the right lower quadrant most likely due to secondary inflammation. Vascular/Lymphatic: No significant vascular findings are present. No enlarged abdominal or pelvic lymph nodes. Reproductive: Stable mild prostatic enlargement. Other: No abdominal wall hernia or abnormality. No abdominopelvic ascites. Musculoskeletal: No acute or significant osseous findings. IMPRESSION: Findings consistent with moderate diverticulitis with adjacent free air consistent with perforation. Free air is also noted in the epigastric region. Wall thickening of adjacent small bowel loop is noted in the pelvis representing secondary inflammation. Mildly dilated small bowel loops are noted more superiorly most likely due to inflammatory ileus. Critical Value/emergent results were called by telephone at the time of interpretation on 09/24/2023 at 1:08 pm to provider AMJAD ALI , who verbally acknowledged these results. Hepatic steatosis. Stable mild prostatic enlargement. Mild bibasilar subsegmental  atelectasis. Electronically Signed   By: Lupita Raider  M.D.   On: 09/24/2023 13:08    Anti-infectives: Anti-infectives (From admission, onward)    Start     Dose/Rate Route Frequency Ordered Stop   09/24/23 2000  piperacillin-tazobactam (ZOSYN) IVPB 3.375 g        3.375 g 12.5 mL/hr over 240 Minutes Intravenous Every 8 hours 09/24/23 1430     09/24/23 1315  piperacillin-tazobactam (ZOSYN) IVPB 3.375 g        3.375 g 100 mL/hr over 30 Minutes Intravenous  Once 09/24/23 1312 09/24/23 1355       Assessment/Plan:  Diverticulitis with microperforation -Pain significantly better.  Afebrile, white count improved to 13 from 20 -Mild AKI improving today creatinine back down to 1.12  Okay to try clear liquid diet today, continue broad-spectrum IV antibiotics, fluid resuscitation.     LOS: 2 days    Berna Bue 09/26/2023

## 2023-09-26 NOTE — Progress Notes (Signed)
Mobility Specialist - Progress Note   09/26/23 0900  Mobility  Activity Ambulated with assistance in hallway  Level of Assistance Independent  Assistive Device None  Distance Ambulated (ft) 1000 ft  Range of Motion/Exercises Active  Activity Response Tolerated well  Mobility Referral Yes  $Mobility charge 1 Mobility  Mobility Specialist Start Time (ACUTE ONLY) 0855  Mobility Specialist Stop Time (ACUTE ONLY) 0905  Mobility Specialist Time Calculation (min) (ACUTE ONLY) 10 min   Received in bed and agreed to mobility. No issues throughout session. Returned to bed with all needs met.  Marilynne Halsted Mobility Specialist

## 2023-09-27 DIAGNOSIS — K5720 Diverticulitis of large intestine with perforation and abscess without bleeding: Secondary | ICD-10-CM | POA: Diagnosis not present

## 2023-09-27 LAB — CBC WITH DIFFERENTIAL/PLATELET
Abs Immature Granulocytes: 0.08 10*3/uL — ABNORMAL HIGH (ref 0.00–0.07)
Basophils Absolute: 0.1 10*3/uL (ref 0.0–0.1)
Basophils Relative: 1 %
Eosinophils Absolute: 0.2 10*3/uL (ref 0.0–0.5)
Eosinophils Relative: 1 %
HCT: 41.3 % (ref 39.0–52.0)
Hemoglobin: 13.3 g/dL (ref 13.0–17.0)
Immature Granulocytes: 1 %
Lymphocytes Relative: 15 %
Lymphs Abs: 1.6 10*3/uL (ref 0.7–4.0)
MCH: 30.6 pg (ref 26.0–34.0)
MCHC: 32.2 g/dL (ref 30.0–36.0)
MCV: 95.2 fL (ref 80.0–100.0)
Monocytes Absolute: 0.9 10*3/uL (ref 0.1–1.0)
Monocytes Relative: 8 %
Neutro Abs: 7.8 10*3/uL — ABNORMAL HIGH (ref 1.7–7.7)
Neutrophils Relative %: 74 %
Platelets: 266 10*3/uL (ref 150–400)
RBC: 4.34 MIL/uL (ref 4.22–5.81)
RDW: 13.2 % (ref 11.5–15.5)
WBC: 10.6 10*3/uL — ABNORMAL HIGH (ref 4.0–10.5)
nRBC: 0 % (ref 0.0–0.2)

## 2023-09-27 LAB — BASIC METABOLIC PANEL
Anion gap: 6 (ref 5–15)
BUN: 25 mg/dL — ABNORMAL HIGH (ref 6–20)
CO2: 23 mmol/L (ref 22–32)
Calcium: 8.4 mg/dL — ABNORMAL LOW (ref 8.9–10.3)
Chloride: 107 mmol/L (ref 98–111)
Creatinine, Ser: 1.04 mg/dL (ref 0.61–1.24)
GFR, Estimated: >60 mL/min (ref >60)
Glucose, Bld: 97 mg/dL (ref 70–99)
Potassium: 4.2 mmol/L (ref 3.5–5.1)
Sodium: 136 mmol/L (ref 135–145)

## 2023-09-27 LAB — MAGNESIUM: Magnesium: 2.3 mg/dL (ref 1.7–2.4)

## 2023-09-27 NOTE — Progress Notes (Signed)
Subjective/Chief Complaint: Per RN patient c/o pain/nausea this AM.   Patient states he overall feels better compared to day of admission. He reports less frequent "episodes" of pain. States he had R flank/RUQ pain today after getting up and down, described as sharp and cramping. This was not after PO intake. Pain is exacerbated by deep inspiration. He reports flatus and small volume, loose BM today. He reports mild nausea and no vomiting.    Objective: Vital signs in last 24 hours: Temp:  [98.3 F (36.8 C)-98.6 F (37 C)] 98.6 F (37 C) (11/25 0512) Pulse Rate:  [82-98] 82 (11/25 0512) Resp:  [18-20] 18 (11/25 0512) BP: (136-144)/(93-99) 142/95 (11/25 0512) SpO2:  [97 %-98 %] 98 % (11/25 0512) Last BM Date : 09/24/23  Intake/Output from previous day: 11/24 0701 - 11/25 0700 In: 1132.8 [I.V.:934.9; IV Piggyback:197.9] Out: -  Intake/Output this shift: No intake/output data recorded.  Alert and well appearing Unlabored respirations Abd soft, mild to moderate distention, mild tenderness globally without peritoneal signs  Lab Results:  Recent Labs    09/26/23 0534 09/27/23 0444  WBC 13.0* 10.6*  HGB 13.7 13.3  HCT 43.3 41.3  PLT 217 266   BMET Recent Labs    09/26/23 0534 09/27/23 0444  NA 134* 136  K 4.2 4.2  CL 106 107  CO2 22 23  GLUCOSE 101* 97  BUN 29* 25*  CREATININE 1.12 1.04  CALCIUM 8.1* 8.4*   PT/INR No results for input(s): "LABPROT", "INR" in the last 72 hours. ABG No results for input(s): "PHART", "HCO3" in the last 72 hours.  Invalid input(s): "PCO2", "PO2"  Studies/Results: No results found.  Anti-infectives: Anti-infectives (From admission, onward)    Start     Dose/Rate Route Frequency Ordered Stop   09/24/23 2000  piperacillin-tazobactam (ZOSYN) IVPB 3.375 g        3.375 g 12.5 mL/hr over 240 Minutes Intravenous Every 8 hours 09/24/23 1430     09/24/23 1315  piperacillin-tazobactam (ZOSYN) IVPB 3.375 g        3.375 g 100 mL/hr  over 30 Minutes Intravenous  Once 09/24/23 1312 09/24/23 1355       Assessment/Plan:  Diverticulitis with microperforation -afebrile, WBC 10.6 (from 13, 20) - CLD started 11/24, continue clears. He has symptoms of ileus -nausea, distention, minimal flatus. This was also appreciable on his CT scan. - continue IV abx  -Mild AKI has resolved  - CT scan was performed on 11/22 showing free air without abscess. Continue to follow clinically. Patient will likely need repeat CT scan in 2-3 days. If he has progression of distention/nausea/vomiting then will need KUB to see if he needs an NG tube.   LOS: 3 days    Adam Phenix 09/27/2023

## 2023-09-27 NOTE — Progress Notes (Signed)
Progress Note    Henry Paul   [id number]  DOB: 06/05/1966  DOA: 09/24/2023     3 PCP: [name], MD  Initial CC: abd pain  Hospital Course: Mr. Wehbe is a 57 year old male with PMH diverticulosis, GERD, hepatic steatosis who presented with abdominal pain.  He has approximately a couple episodes of diverticulitis yearly which is treated at home with antibiotics.  He says he has not been hospitalized for diverticulitis in approximately 5 years.  His last colonoscopy was about 8 years ago. After starting antibiotics at home he developed some chills but no fever and due to not feeling better, presented to the ER. CT abdomen/pelvis showed moderate diverticulitis with adjacent free air consistent with perforation.  He was started on Zosyn and general surgery was consulted.  Interval History:  Mild improvement.  He endorses having multiple bowel movements since yesterday.  Still some nausea and not tolerating much clear liquids.  Also had a small amounts of right sided pain this morning.  Assessment and Plan: * Perforation of sigmoid colon due to diverticulitis - CT on admission shows moderate sigmoid diverticulitis with small perforation and free air also noted in the epigastric region -Had been taking antibiotics at home for approximately 1 to 2 days with no improvement -General Surgery following, appreciate assistance - Continue Zosyn -Slow clinical improvement.  Minimal intake of clear liquids  BPH (benign prostatic hyperplasia) - Continue Flomax  GERD (gastroesophageal reflux disease) - Continue Protonix  Sepsis (HCC)-resolved as of 09/25/2023 - Tachycardia, leukocytosis.  Underlying diverticulitis on admission - See separate problem   Old records reviewed in assessment of this patient  Antimicrobials: Zosyn 09/24/2023 >> current  DVT prophylaxis:  enoxaparin (LOVENOX) injection 40 mg Start: 09/24/23 2200 SCDs Start: 09/24/23 1357   Code Status:   Code  Status: Full Code  Mobility Assessment (Last 72 Hours)     Mobility Assessment     Row Name 09/27/23 0900 09/26/23 2015 09/26/23 1025 09/25/23 2000 09/25/23 0845   Does patient have an order for bedrest or is patient medically unstable No - Continue assessment No - Continue assessment No - Continue assessment No - Continue assessment No - Continue assessment   What is the highest level of mobility based on the progressive mobility assessment? Level 6 (Walks independently in room and hall) - Balance while walking in room without assist - Complete Level 6 (Walks independently in room and hall) - Balance while walking in room without assist - Complete Level 6 (Walks independently in room and hall) - Balance while walking in room without assist - Complete Level 6 (Walks independently in room and hall) - Balance while walking in room without assist - Complete Level 6 (Walks independently in room and hall) - Balance while walking in room without assist - Complete    Row Name 09/24/23 1945 09/24/23 1624         Does patient have an order for bedrest or is patient medically unstable No - Continue assessment No - Continue assessment      What is the highest level of mobility based on the progressive mobility assessment? Level 6 (Walks independently in room and hall) - Balance while walking in room without assist - Complete Level 6 (Walks independently in room and hall) - Balance while walking in room without assist - Complete               Barriers to discharge: none Disposition Plan:  Home  Status is: Inpt  Objective:  Blood pressure (!) 158/95, pulse 93, temperature 99.2 F (37.3 C), temperature source Oral, resp. rate 16, height 5\' 9"  (1.753 m), weight 83.9 kg, SpO2 97%.  Examination:  Physical Exam Constitutional:      General: He is not in acute distress.    Appearance: Normal appearance.  HENT:     Head: Normocephalic and atraumatic.     Mouth/Throat:     Mouth: Mucous membranes are  moist.  Eyes:     Extraocular Movements: Extraocular movements intact.  Cardiovascular:     Rate and Rhythm: Normal rate and regular rhythm.  Pulmonary:     Effort: Pulmonary effort is normal. No respiratory distress.     Breath sounds: Normal breath sounds. No wheezing.  Abdominal:     General: Bowel sounds are normal. There is distension (mild; Improved).     Palpations: Abdomen is soft.     Tenderness: There is abdominal tenderness.  Musculoskeletal:        General: Normal range of motion.     Cervical back: Normal range of motion and neck supple.  Skin:    General: Skin is warm and dry.  Neurological:     General: No focal deficit present.     Mental Status: He is alert.  Psychiatric:        Mood and Affect: Mood normal.        Behavior: Behavior normal.      Consultants:  General surgery  Procedures:    Data Reviewed: Results for orders placed or performed during the hospital encounter of 09/24/23 (from the past 24 hour(s))  CBC with Differential/Platelet     Status: Abnormal   Collection Time: 09/27/23  4:44 AM  Result Value Ref Range   WBC 10.6 (H) 4.0 - 10.5 K/uL   RBC 4.34 4.22 - 5.81 MIL/uL   Hemoglobin 13.3 13.0 - 17.0 g/dL   HCT 01.6 01.0 - 93.2 %   MCV 95.2 80.0 - 100.0 fL   MCH 30.6 26.0 - 34.0 pg   MCHC 32.2 30.0 - 36.0 g/dL   RDW 35.5 73.2 - 20.2 %   Platelets 266 150 - 400 K/uL   nRBC 0.0 0.0 - 0.2 %   Neutrophils Relative % 74 %   Neutro Abs 7.8 (H) 1.7 - 7.7 K/uL   Lymphocytes Relative 15 %   Lymphs Abs 1.6 0.7 - 4.0 K/uL   Monocytes Relative 8 %   Monocytes Absolute 0.9 0.1 - 1.0 K/uL   Eosinophils Relative 1 %   Eosinophils Absolute 0.2 0.0 - 0.5 K/uL   Basophils Relative 1 %   Basophils Absolute 0.1 0.0 - 0.1 K/uL   Immature Granulocytes 1 %   Abs Immature Granulocytes 0.08 (H) 0.00 - 0.07 K/uL  Basic metabolic panel     Status: Abnormal   Collection Time: 09/27/23  4:44 AM  Result Value Ref Range   Sodium 136 135 - 145 mmol/L    Potassium 4.2 3.5 - 5.1 mmol/L   Chloride 107 98 - 111 mmol/L   CO2 23 22 - 32 mmol/L   Glucose, Bld 97 70 - 99 mg/dL   BUN 25 (H) 6 - 20 mg/dL   Creatinine, Ser 5.42 0.61 - 1.24 mg/dL   Calcium 8.4 (L) 8.9 - 10.3 mg/dL   GFR, Estimated >70 >62 mL/min   Anion gap 6 5 - 15  Magnesium     Status: None   Collection Time: 09/27/23  4:44 AM  Result Value Ref Range  Magnesium 2.3 1.7 - 2.4 mg/dL    I have reviewed pertinent nursing notes, vitals, labs, and images as necessary. I have ordered labwork to follow up on as indicated.  I have reviewed the last notes from staff over past 24 hours. I have discussed patient's care plan and test results with nursing staff, CM/SW, and other staff as appropriate.  Time spent: Greater than 50% of the 55 minute visit was spent in counseling/coordination of care for the patient as laid out in the A&P.   LOS: 3 days   Lewie Chamber, MD [facility] 09/27/2023, 2:52 PM

## 2023-09-27 NOTE — Progress Notes (Signed)
   09/27/23 0938  TOC Brief Assessment  Insurance and Status Reviewed  Patient has primary care physician Yes  Home environment has been reviewed Home w/ spouse  Prior level of function: Independent  Prior/Current Home Services No current home services  Social Determinants of Health Reivew SDOH reviewed no interventions necessary  Readmission risk has been reviewed Yes  Transition of care needs no transition of care needs at this time

## 2023-09-27 NOTE — Plan of Care (Signed)
Problem: Education: Goal: Knowledge of General Education information will improve Description: Including pain rating scale, medication(s)/side effects and non-pharmacologic comfort measures Outcome: Progressing   Problem: Health Behavior/Discharge Planning: Goal: Ability to manage health-related needs will improve Outcome: Progressing   Problem: Clinical Measurements: Goal: Ability to maintain clinical measurements within normal limits will improve Outcome: Progressing Goal: Diagnostic test results will improve Outcome: Progressing   Problem: Coping: Goal: Level of anxiety will decrease Outcome: Progressing

## 2023-09-27 NOTE — Progress Notes (Signed)
Mobility Specialist - Progress Note   09/27/23 1000  Mobility  Activity Ambulated with assistance in hallway  Level of Assistance Modified independent, requires aide device or extra time  Assistive Device None  Distance Ambulated (ft) 500 ft  Range of Motion/Exercises Active  Activity Response Tolerated well  Mobility Referral Yes  $Mobility charge 1 Mobility  Mobility Specialist Start Time (ACUTE ONLY) 1010  Mobility Specialist Stop Time (ACUTE ONLY) 1022  Mobility Specialist Time Calculation (min) (ACUTE ONLY) 12 min   Received in bed and agreed to mobility. C/o soreness throughout session. Returned to bed with all needs met.  Marilynne Halsted Mobility Specialist

## 2023-09-28 DIAGNOSIS — K5720 Diverticulitis of large intestine with perforation and abscess without bleeding: Secondary | ICD-10-CM | POA: Diagnosis not present

## 2023-09-28 LAB — BASIC METABOLIC PANEL
Anion gap: 6 (ref 5–15)
BUN: 20 mg/dL (ref 6–20)
CO2: 25 mmol/L (ref 22–32)
Calcium: 8.4 mg/dL — ABNORMAL LOW (ref 8.9–10.3)
Chloride: 103 mmol/L (ref 98–111)
Creatinine, Ser: 0.94 mg/dL (ref 0.61–1.24)
GFR, Estimated: >60 mL/min (ref >60)
Glucose, Bld: 110 mg/dL — ABNORMAL HIGH (ref 70–99)
Potassium: 4 mmol/L (ref 3.5–5.1)
Sodium: 134 mmol/L — ABNORMAL LOW (ref 135–145)

## 2023-09-28 LAB — CBC WITH DIFFERENTIAL/PLATELET
Abs Immature Granulocytes: 0.14 10*3/uL — ABNORMAL HIGH (ref 0.00–0.07)
Basophils Absolute: 0.1 10*3/uL (ref 0.0–0.1)
Basophils Relative: 1 %
Eosinophils Absolute: 0.2 10*3/uL (ref 0.0–0.5)
Eosinophils Relative: 3 %
HCT: 42.3 % (ref 39.0–52.0)
Hemoglobin: 13.8 g/dL (ref 13.0–17.0)
Immature Granulocytes: 2 %
Lymphocytes Relative: 19 %
Lymphs Abs: 1.6 10*3/uL (ref 0.7–4.0)
MCH: 30.5 pg (ref 26.0–34.0)
MCHC: 32.6 g/dL (ref 30.0–36.0)
MCV: 93.4 fL (ref 80.0–100.0)
Monocytes Absolute: 0.8 10*3/uL (ref 0.1–1.0)
Monocytes Relative: 10 %
Neutro Abs: 5.5 10*3/uL (ref 1.7–7.7)
Neutrophils Relative %: 65 %
Platelets: 282 10*3/uL (ref 150–400)
RBC: 4.53 MIL/uL (ref 4.22–5.81)
RDW: 12.9 % (ref 11.5–15.5)
WBC: 8.4 10*3/uL (ref 4.0–10.5)
nRBC: 0 % (ref 0.0–0.2)

## 2023-09-28 LAB — MAGNESIUM: Magnesium: 2.3 mg/dL (ref 1.7–2.4)

## 2023-09-28 MED ORDER — ENSURE ENLIVE PO LIQD
237.0000 mL | Freq: Three times a day (TID) | ORAL | Status: DC
  Administered 2023-09-28 (×2): 237 mL via ORAL

## 2023-09-28 MED ORDER — DIPHENHYDRAMINE HCL 25 MG PO CAPS
50.0000 mg | ORAL_CAPSULE | Freq: Every day | ORAL | Status: DC
  Administered 2023-09-28 – 2023-09-30 (×3): 50 mg via ORAL
  Filled 2023-09-28 (×4): qty 2

## 2023-09-28 NOTE — Plan of Care (Signed)
Problem: Education: Goal: Knowledge of General Education information will improve Description: Including pain rating scale, medication(s)/side effects and non-pharmacologic comfort measures Outcome: Progressing   Problem: Health Behavior/Discharge Planning: Goal: Ability to manage health-related needs will improve Outcome: Progressing   Problem: Clinical Measurements: Goal: Ability to maintain clinical measurements within normal limits will improve Outcome: Progressing Goal: Diagnostic test results will improve Outcome: Progressing   Problem: Activity: Goal: Risk for activity intolerance will decrease Outcome: Progressing   Problem: Nutrition: Goal: Adequate nutrition will be maintained Outcome: Progressing   Problem: Coping: Goal: Level of anxiety will decrease Outcome: Progressing

## 2023-09-28 NOTE — Progress Notes (Signed)
Subjective/Chief Complaint:  Overall better today. Reports less pain - still with intermittent RLQ cramping. Denies nausea. Vomiting. Having small BMs that are non-bloody. Tolerating CLD.   Objective: Vital signs in last 24 hours: Temp:  [98.2 F (36.8 C)-99.2 F (37.3 C)] 98.2 F (36.8 C) (11/25 1931) Pulse Rate:  [91-93] 91 (11/25 1931) Resp:  [16-20] 20 (11/25 1931) BP: (138-158)/(93-95) 138/93 (11/25 1931) SpO2:  [97 %] 97 % (11/25 1931) Last BM Date : 09/27/23  Intake/Output from previous day: No intake/output data recorded. Intake/Output this shift: No intake/output data recorded.  Alert and well appearing Unlabored respirations Abd soft, mild distention, focally tender in RLQ  Lab Results:  Recent Labs    09/27/23 0444 09/28/23 0516  WBC 10.6* 8.4  HGB 13.3 13.8  HCT 41.3 42.3  PLT 266 282   BMET Recent Labs    09/27/23 0444 09/28/23 0516  NA 136 134*  K 4.2 4.0  CL 107 103  CO2 23 25  GLUCOSE 97 110*  BUN 25* 20  CREATININE 1.04 0.94  CALCIUM 8.4* 8.4*   Studies/Results: No results found.  Anti-infectives: Anti-infectives (From admission, onward)    Start     Dose/Rate Route Frequency Ordered Stop   09/24/23 2000  piperacillin-tazobactam (ZOSYN) IVPB 3.375 g        3.375 g 12.5 mL/hr over 240 Minutes Intravenous Every 8 hours 09/24/23 1430     09/24/23 1315  piperacillin-tazobactam (ZOSYN) IVPB 3.375 g        3.375 g 100 mL/hr over 30 Minutes Intravenous  Once 09/24/23 1312 09/24/23 1355       Assessment/Plan: Diverticulitis with microperforation -afebrile, WBC normalized to 8.6 - advance to FLD - continue IV abx  - Mild AKI has resolved  - He is clinically improving. CT scan was performed on 11/22 showing free air without abscess. Will discuss timing of follow up CT with MD - possibly tomorrow, 5 days from last scan, to look for development of abscess prior to discharge planning.   LOS: 4 days    Henry Paul 09/28/2023

## 2023-09-28 NOTE — Progress Notes (Signed)
Progress Note    Henry Paul   ZOX:[id number]  DOB: July 12, 1966  DOA: 09/24/2023     4 PCP: Henry Housekeeper, MD  Initial CC: abd pain  Hospital Course: Henry Paul is a 57 year old male with PMH diverticulosis, GERD, hepatic steatosis who presented with abdominal pain.  He has approximately a couple episodes of diverticulitis yearly which is treated at home with antibiotics.  He says he has not been hospitalized for diverticulitis in approximately 5 years.  His last colonoscopy was about 8 years ago. After starting antibiotics at home he developed some chills but no fever and due to not feeling better, presented to the ER. CT abdomen/pelvis showed moderate diverticulitis with adjacent free air consistent with perforation.  He was started on Zosyn and general surgery was consulted.  Interval History:  Doing a little better today.  Tolerating more liquids.  Improvement some in the abdominal discomfort and distention.  Still endorsing bowel movements and flatus when on the commode.  Some nausea, still no vomiting.  Assessment and Plan: * Perforation of sigmoid colon due to diverticulitis - CT on admission shows moderate sigmoid diverticulitis with small perforation and free air also noted in the epigastric region -Had been taking antibiotics at home for approximately 1 to 2 days with no improvement -General Surgery following, appreciate assistance - Continue Zosyn -Slow clinical improvement.  Advanced to FLD per surgery - planning for repeat CT tomorrow to re-eval  BPH (benign prostatic hyperplasia) - Continue Flomax  GERD (gastroesophageal reflux disease) - Continue Protonix  Sepsis (HCC)-resolved as of 09/25/2023 - Tachycardia, leukocytosis.  Underlying diverticulitis on admission - See separate problem   Old records reviewed in assessment of this patient  Antimicrobials: Zosyn 09/24/2023 >> current  DVT prophylaxis:  enoxaparin (LOVENOX) injection 40 mg Start: 09/24/23  2200 SCDs Start: 09/24/23 1357   Code Status:   Code Status: Full Code  Mobility Assessment (Last 72 Hours)     Mobility Assessment     Row Name 09/28/23 0800 09/27/23 2100 09/27/23 0900 09/26/23 2015 09/26/23 1025   Does patient have an order for bedrest or is patient medically unstable No - Continue assessment No - Continue assessment No - Continue assessment No - Continue assessment No - Continue assessment   What is the highest level of mobility based on the progressive mobility assessment? Level 6 (Walks independently in room and hall) - Balance while walking in room without assist - Complete Level 6 (Walks independently in room and hall) - Balance while walking in room without assist - Complete Level 6 (Walks independently in room and hall) - Balance while walking in room without assist - Complete Level 6 (Walks independently in room and hall) - Balance while walking in room without assist - Complete Level 6 (Walks independently in room and hall) - Balance while walking in room without assist - Complete    Row Name 09/25/23 2000           Does patient have an order for bedrest or is patient medically unstable No - Continue assessment       What is the highest level of mobility based on the progressive mobility assessment? Level 6 (Walks independently in room and hall) - Balance while walking in room without assist - Complete                Barriers to discharge: none Disposition Plan:  Home  Status is: Inpt  Objective: Blood pressure (!) 138/93, pulse 91, temperature 98.2 F (36.8  C), resp. rate 20, height 5\' 9"  (1.753 m), weight 83.9 kg, SpO2 97%.  Examination:  Physical Exam Constitutional:      General: He is not in acute distress.    Appearance: Normal appearance.  HENT:     Head: Normocephalic and atraumatic.     Mouth/Throat:     Mouth: Mucous membranes are moist.  Eyes:     Extraocular Movements: Extraocular movements intact.  Cardiovascular:     Rate and  Rhythm: Normal rate and regular rhythm.  Pulmonary:     Effort: Pulmonary effort is normal. No respiratory distress.     Breath sounds: Normal breath sounds. No wheezing.  Abdominal:     General: Bowel sounds are normal. There is distension (mild; Improved).     Palpations: Abdomen is soft.     Tenderness: There is abdominal tenderness.  Musculoskeletal:        General: Normal range of motion.     Cervical back: Normal range of motion and neck supple.  Skin:    General: Skin is warm and dry.  Neurological:     General: No focal deficit present.     Mental Status: He is alert.  Psychiatric:        Mood and Affect: Mood normal.        Behavior: Behavior normal.      Consultants:  General surgery  Procedures:    Data Reviewed: Results for orders placed or performed during the hospital encounter of 09/24/23 (from the past 24 hour(s))  CBC with Differential/Platelet     Status: Abnormal   Collection Time: 09/28/23  5:16 AM  Result Value Ref Range   WBC 8.4 4.0 - 10.5 K/uL   RBC 4.53 4.22 - 5.81 MIL/uL   Hemoglobin 13.8 13.0 - 17.0 g/dL   HCT 16.1 09.6 - 04.5 %   MCV 93.4 80.0 - 100.0 fL   MCH 30.5 26.0 - 34.0 pg   MCHC 32.6 30.0 - 36.0 g/dL   RDW 40.9 81.1 - 91.4 %   Platelets 282 150 - 400 K/uL   nRBC 0.0 0.0 - 0.2 %   Neutrophils Relative % 65 %   Neutro Abs 5.5 1.7 - 7.7 K/uL   Lymphocytes Relative 19 %   Lymphs Abs 1.6 0.7 - 4.0 K/uL   Monocytes Relative 10 %   Monocytes Absolute 0.8 0.1 - 1.0 K/uL   Eosinophils Relative 3 %   Eosinophils Absolute 0.2 0.0 - 0.5 K/uL   Basophils Relative 1 %   Basophils Absolute 0.1 0.0 - 0.1 K/uL   Immature Granulocytes 2 %   Abs Immature Granulocytes 0.14 (H) 0.00 - 0.07 K/uL  Basic metabolic panel     Status: Abnormal   Collection Time: 09/28/23  5:16 AM  Result Value Ref Range   Sodium 134 (L) 135 - 145 mmol/L   Potassium 4.0 3.5 - 5.1 mmol/L   Chloride 103 98 - 111 mmol/L   CO2 25 22 - 32 mmol/L   Glucose, Bld 110 (H)  70 - 99 mg/dL   BUN 20 6 - 20 mg/dL   Creatinine, Ser 7.82 0.61 - 1.24 mg/dL   Calcium 8.4 (L) 8.9 - 10.3 mg/dL   GFR, Estimated >95 >62 mL/min   Anion gap 6 5 - 15  Magnesium     Status: None   Collection Time: 09/28/23  5:16 AM  Result Value Ref Range   Magnesium 2.3 1.7 - 2.4 mg/dL    I have reviewed  pertinent nursing notes, vitals, labs, and images as necessary. I have ordered labwork to follow up on as indicated.  I have reviewed the last notes from staff over past 24 hours. I have discussed patient's care plan and test results with nursing staff, CM/SW, and other staff as appropriate.    LOS: 4 days   Lewie Chamber, MD [facility] 09/28/2023, 2:46 PM

## 2023-09-29 ENCOUNTER — Inpatient Hospital Stay (HOSPITAL_COMMUNITY): Payer: 59

## 2023-09-29 DIAGNOSIS — K5720 Diverticulitis of large intestine with perforation and abscess without bleeding: Secondary | ICD-10-CM | POA: Diagnosis not present

## 2023-09-29 LAB — CBC WITH DIFFERENTIAL/PLATELET
Abs Immature Granulocytes: 0.2 10*3/uL — ABNORMAL HIGH (ref 0.00–0.07)
Basophils Absolute: 0.1 10*3/uL (ref 0.0–0.1)
Basophils Relative: 1 %
Eosinophils Absolute: 0.4 10*3/uL (ref 0.0–0.5)
Eosinophils Relative: 4 %
HCT: 44.6 % (ref 39.0–52.0)
Hemoglobin: 14.7 g/dL (ref 13.0–17.0)
Immature Granulocytes: 2 %
Lymphocytes Relative: 16 %
Lymphs Abs: 1.9 10*3/uL (ref 0.7–4.0)
MCH: 30.6 pg (ref 26.0–34.0)
MCHC: 33 g/dL (ref 30.0–36.0)
MCV: 92.9 fL (ref 80.0–100.0)
Monocytes Absolute: 1 10*3/uL (ref 0.1–1.0)
Monocytes Relative: 8 %
Neutro Abs: 8.4 10*3/uL — ABNORMAL HIGH (ref 1.7–7.7)
Neutrophils Relative %: 69 %
Platelets: 349 10*3/uL (ref 150–400)
RBC: 4.8 MIL/uL (ref 4.22–5.81)
RDW: 12.9 % (ref 11.5–15.5)
WBC: 12 10*3/uL — ABNORMAL HIGH (ref 4.0–10.5)
nRBC: 0 % (ref 0.0–0.2)

## 2023-09-29 LAB — BASIC METABOLIC PANEL
Anion gap: 12 (ref 5–15)
BUN: 21 mg/dL — ABNORMAL HIGH (ref 6–20)
CO2: 24 mmol/L (ref 22–32)
Calcium: 9.1 mg/dL (ref 8.9–10.3)
Chloride: 103 mmol/L (ref 98–111)
Creatinine, Ser: 1.03 mg/dL (ref 0.61–1.24)
GFR, Estimated: >60 mL/min (ref >60)
Glucose, Bld: 93 mg/dL (ref 70–99)
Potassium: 4.2 mmol/L (ref 3.5–5.1)
Sodium: 139 mmol/L (ref 135–145)

## 2023-09-29 LAB — PROTIME-INR
INR: 1.1 (ref 0.8–1.2)
Prothrombin Time: 14.2 s (ref 11.4–15.2)

## 2023-09-29 LAB — MAGNESIUM: Magnesium: 2.2 mg/dL (ref 1.7–2.4)

## 2023-09-29 MED ORDER — DIPHENHYDRAMINE HCL 50 MG/ML IJ SOLN
INTRAMUSCULAR | Status: AC
  Filled 2023-09-29: qty 1

## 2023-09-29 MED ORDER — SODIUM CHLORIDE 0.9% FLUSH
5.0000 mL | Freq: Three times a day (TID) | INTRAVENOUS | Status: DC
  Administered 2023-09-29 – 2023-10-01 (×5): 5 mL

## 2023-09-29 MED ORDER — FENTANYL CITRATE (PF) 100 MCG/2ML IJ SOLN
INTRAMUSCULAR | Status: AC | PRN
  Administered 2023-09-29 (×2): 50 ug via INTRAVENOUS

## 2023-09-29 MED ORDER — IOHEXOL 9 MG/ML PO SOLN
500.0000 mL | ORAL | Status: AC
  Administered 2023-09-29 (×2): 500 mL via ORAL

## 2023-09-29 MED ORDER — DIPHENHYDRAMINE HCL 50 MG/ML IJ SOLN
INTRAMUSCULAR | Status: AC | PRN
  Administered 2023-09-29: 25 mg via INTRAVENOUS

## 2023-09-29 MED ORDER — IOHEXOL 9 MG/ML PO SOLN
ORAL | Status: AC
  Filled 2023-09-29: qty 1000

## 2023-09-29 MED ORDER — MIDAZOLAM HCL 2 MG/2ML IJ SOLN
INTRAMUSCULAR | Status: AC
Start: 2023-09-29 — End: ?
  Filled 2023-09-29: qty 4

## 2023-09-29 MED ORDER — MIDAZOLAM HCL 2 MG/2ML IJ SOLN
INTRAMUSCULAR | Status: AC | PRN
  Administered 2023-09-29 (×4): 1 mg via INTRAVENOUS

## 2023-09-29 MED ORDER — IOHEXOL 300 MG/ML  SOLN
100.0000 mL | Freq: Once | INTRAMUSCULAR | Status: AC | PRN
  Administered 2023-09-29: 100 mL via INTRAVENOUS

## 2023-09-29 MED ORDER — FENTANYL CITRATE (PF) 100 MCG/2ML IJ SOLN
INTRAMUSCULAR | Status: AC
  Filled 2023-09-29: qty 2

## 2023-09-29 NOTE — H&P (Signed)
Chief Complaint: Patient was seen in consultation today for peritoneal fluid collection aspiration and drainage, at the request of Barnetta Chapel, PA-C Chief Complaint  Patient presents with   Abdominal Pain   Supervising Physician: Roanna Banning  Patient Status: [facility] - In-pt  History of Present Illness: Henry Paul is a 57 y.o. male   FULL Code status per patient and chart.  Patient has a history of BPH, GERD, and recurrent diverticulitis who developed an acute episode of RLQ abdominal pain on 11/21 while at work. He has 2-3 episodes of diverticulitis at least per year and Dr. Loreta Ave, his GI doctor, has Cipro/Flagyl ordered for him yearly to take when he develops an episode.   This episode was more intense than his previous episodes. His pain seems improved some today, but due to persistent pain and severity of this episode, patient presented to ED. At that time, his WBC was 20K and his CT scan shows diverticulitis with a small amount of pneumoperitoneum consistent with perforation. He was admitted with sepsis for IV antibiotic therapy. He improved some since admission, and tolerating a full liquid diet.  Follow-up CT A/P w/ oral contrast today pending final review. Preliminary review with possible mildly increased pneumoperitoneum under diaphragm with possible abscess formation vs bowel air fluid level.   IR was consulted for review and consideration for peritoneal fluid collection aspiration and drain placement. Reviewed and approved by Dr. Milford Cage. Patient is scheduled for same in IR today.  He is NPO since 5am.    Past Medical History:  Diagnosis Date   Fatty liver    GERD (gastroesophageal reflux disease)     Past Surgical History:  Procedure Laterality Date   APPENDECTOMY     CHOLECYSTECTOMY N/A 04/17/2021   Procedure: LAPAROSCOPIC CHOLECYSTECTOMY;  Surgeon: Abigail Miyamoto, MD;  Location: [facility] ORS;  Service: General;  Laterality: N/A;   COLONOSCOPY     FINGER  SURGERY Left    ring and middle fingers reatttachment after amputation   WISDOM TOOTH EXTRACTION      Allergies: Patient has no known allergies.  Medications: Prior to Admission medications   Medication Sig Start Date End Date Taking? Authorizing Provider  acetaminophen (TYLENOL) 500 MG tablet Take 1,000 mg by mouth every 6 (six) hours as needed for mild pain (pain score 1-3) or moderate pain (pain score 4-6).   Yes [provider]  ciprofloxacin (CIPRO) 500 MG tablet Take 500 mg by mouth 2 (two) times daily. 04/24/22  Yes [provider]  metroNIDAZOLE (FLAGYL) 250 MG tablet Take 250 mg by mouth 3 (three) times daily. 04/27/22  Yes [provider]  pantoprazole (PROTONIX) 20 MG tablet Take 20 mg by mouth every evening. 04/02/21  Yes [provider]  tamsulosin (FLOMAX) 0.4 MG CAPS capsule Take 0.4 mg by mouth every evening. 03/24/21  Yes [provider]     History reviewed. No pertinent family history.  Social History   Socioeconomic History   Marital status: Married    Spouse name: Not on file   Number of children: Not on file   Years of education: Not on file   Highest education level: Not on file  Occupational History   Not on file  Tobacco Use   Smoking status: Never   Smokeless tobacco: Never  Substance and Sexual Activity   Alcohol use: No   Drug use: No   Sexual activity: Not on file  Other Topics Concern   Not on file  Social History  Narrative   Works in [employer] representative   Social Determinants of Corporate investment banker Strain: Not on file  Food Insecurity: No Food Insecurity (09/24/2023)   Hunger Vital Sign    Worried About Running Out of Food in the Last Year: Never true    Ran Out of Food in the Last Year: Never true  Transportation Needs: No Transportation Needs (09/24/2023)   PRAPARE - Administrator, Civil Service (Medical): No    Lack of Transportation (Non-Medical): No  Physical Activity: Not on  file  Stress: Not on file  Social Connections: Not on file    Review of Systems: A 12 point ROS discussed and pertinent positives are indicated in the HPI above.  All other systems are negative.  Review of Systems  Constitutional:  Positive for chills and fever. Negative for activity change.  Respiratory:  Negative for cough, chest tightness, shortness of breath and wheezing.   Cardiovascular:  Negative for chest pain.  Gastrointestinal:  Positive for abdominal pain and nausea.  Skin:  Negative for color change.  Psychiatric/Behavioral:  Negative for behavioral problems and confusion.     Vital Signs: BP (!) 157/98 (BP Location: Right Arm)   Pulse 85   Temp 97.7 F (36.5 C) (Oral)   Resp 19   Ht 5\' 9"  (1.753 m)   Wt 185 lb (83.9 kg)   SpO2 97%   BMI 27.32 kg/m     Physical Exam Constitutional:      Appearance: Normal appearance. He is well-developed.  HENT:     Mouth/Throat:     Mouth: Mucous membranes are moist.  Cardiovascular:     Rate and Rhythm: Regular rhythm. Tachycardia present.     Pulses: Normal pulses.     Heart sounds: Normal heart sounds. No murmur heard. Pulmonary:     Effort: Pulmonary effort is normal. No respiratory distress.     Breath sounds: Normal breath sounds.  Abdominal:     Tenderness: There is abdominal tenderness.     Comments: TTP at lower border of RLQ  Musculoskeletal:        General: Normal range of motion.  Skin:    General: Skin is warm and dry.  Neurological:     Mental Status: He is alert and oriented to person, place, and time.  Psychiatric:        Mood and Affect: Mood normal.        Behavior: Behavior normal.        Thought Content: Thought content normal.        Judgment: Judgment normal.     Imaging: CT ABDOMEN PELVIS W CONTRAST  Result Date: 09/24/2023 CLINICAL DATA:  Acute generalized abdominal pain. EXAM: CT ABDOMEN AND PELVIS WITH CONTRAST TECHNIQUE: Multidetector CT imaging of the abdomen and pelvis was  performed using the standard protocol following bolus administration of intravenous contrast. RADIATION DOSE REDUCTION: This exam was performed according to the departmental dose-optimization program which includes automated exposure control, adjustment of the mA and/or kV according to patient size and/or use of iterative reconstruction technique. CONTRAST:  OMNIPAQUE IOHEXOL 300 MG/ML  SOLN COMPARISON:  February 12, 2021. FINDINGS: Lower chest: Mild bibasilar subsegmental atelectasis is noted. Hepatobiliary: Hepatic steatosis. Status post cholecystectomy. No biliary dilatation. Pancreas: Unremarkable. No pancreatic ductal dilatation or surrounding inflammatory changes. Spleen: Normal in size without focal abnormality. Adrenals/Urinary Tract: Adrenal glands appear normal. Bilateral renal cysts are noted for which no further follow-up is required. No hydronephrosis or renal  obstruction is noted. Urinary bladder is unremarkable. Stomach/Bowel: Status post appendectomy. Stomach is unremarkable. Mildly dilated small bowel loops are noted most likely representing ileus secondary to inflammation. Moderate sigmoid diverticulitis is noted with adjacent air in the peritoneal fat consistent with small perforation. Free air is also noted in the epigastric region. Wall thickening of small bowel loop is seen in the right lower quadrant most likely due to secondary inflammation. Vascular/Lymphatic: No significant vascular findings are present. No enlarged abdominal or pelvic lymph nodes. Reproductive: Stable mild prostatic enlargement. Other: No abdominal wall hernia or abnormality. No abdominopelvic ascites. Musculoskeletal: No acute or significant osseous findings. IMPRESSION: Findings consistent with moderate diverticulitis with adjacent free air consistent with perforation. Free air is also noted in the epigastric region. Wall thickening of adjacent small bowel loop is noted in the pelvis representing secondary  inflammation. Mildly dilated small bowel loops are noted more superiorly most likely due to inflammatory ileus. Critical Value/emergent results were called by telephone at the time of interpretation on 09/24/2023 at 1:08 pm to provider AMJAD ALI , who verbally acknowledged these results. Hepatic steatosis. Stable mild prostatic enlargement. Mild bibasilar subsegmental atelectasis. Electronically Signed   By: Lupita Raider M.D.   On: 09/24/2023 13:08    Labs:  CBC: Recent Labs    09/26/23 0534 09/27/23 0444 09/28/23 0516 09/29/23 0548  WBC 13.0* 10.6* 8.4 12.0*  HGB 13.7 13.3 13.8 14.7  HCT 43.3 41.3 42.3 44.6  PLT 217 266 282 349    COAGS: No results for input(s): "INR", "APTT" in the last 8760 hours.  BMP: Recent Labs    09/26/23 0534 09/27/23 0444 09/28/23 0516 09/29/23 0548  NA 134* 136 134* 139  K 4.2 4.2 4.0 4.2  CL 106 107 103 103  CO2 22 23 25 24   GLUCOSE 101* 97 110* 93  BUN 29* 25* 20 21*  CALCIUM 8.1* 8.4* 8.4* 9.1  CREATININE 1.12 1.04 0.94 1.03  GFRNONAA >60 >60 >60 >60    LIVER FUNCTION TESTS: Recent Labs    09/24/23 1122  BILITOT 1.9*  AST 22  ALT 38  ALKPHOS 44  PROT 7.6  ALBUMIN 4.3    TUMOR MARKERS: No results for input(s): "AFPTM", "CEA", "CA199", "CHROMGRNA" in the last 8760 hours.  Assessment and Plan:  Patient with hx of chronic diverticulitis presented to ED on 11/22 for abdominal pain. CT with small diverticular perforation and small pneumoperitoneum. Patient was admitted for sepsis, and improving with antibiotic therapy, tolerating full liquid diet. Follow-up CT concerning for peritoneal abscess. IR Dr. Milford Cage reviewed and approved request for peritoneal fluid collection aspiration and drain placement. Patient presents to IR today for same.  Risks and benefits discussed with the patient including bleeding, infection, damage to adjacent structures, bowel perforation/fistula connection, and sepsis.  All of the patient's questions  were answered, patient is agreeable to proceed. Consent signed and in chart.   Thank you for this interesting consult.  I greatly enjoyed meeting Henry Paul and look forward to participating in their care.  A copy of this report was sent to the requesting provider on this date.  Electronically Signed: Sable Feil, PA-C 09/29/2023, 10:24 AM   I spent a total of 20 Minutes    in face to face in clinical consultation, greater than 50% of which was counseling/coordinating care for peritoneal fluid collection aspiration and drain placement

## 2023-09-29 NOTE — Procedures (Signed)
Vascular and Interventional Radiology Procedure Note  Patient: Henry Paul DOB: 1966/08/27 Medical Record Number: [id number] Note Date/Time: 09/29/23 5:39 PM   Performing Physician: Roanna Banning, MD Assistant(s): None  Diagnosis: Perforated diverticulitis w intra-abdominal abscess  Procedure: DRAINAGE CATHETER PLACEMENT into RIGHT LOWER QUADRANT ABSCESS  Anesthesia: Conscious Sedation Complications: None Estimated Blood Loss: Minimal Specimens: Sent for Gram Stain, Aerobe Culture, and Anerobe Culture  Findings:  Successful CT-guided placement of 12 F catheter into RLQ abscess. Purulent fluid aspirated and sent for microbiological analysis.  Plan:  - Flush drain with 5 mL Normal Saline every 8 hours. - Follow up drain evaluation / sinogram in 10 day(s).  See detailed procedure note with images in PACS. The patient tolerated the procedure well without incident or complication and was returned to Recovery in stable condition.    Roanna Banning, MD [facility]   Pager. [phone] Clinic. [phone]

## 2023-09-29 NOTE — Progress Notes (Signed)
Subjective/Chief Complaint: Still with some tenderness in the RLQ.  Some nausea after drinking all the contrast.  Having diarrhea.  Objective: Vital signs in last 24 hours: Temp:  [97.7 F (36.5 C)-98.2 F (36.8 C)] 97.7 F (36.5 C) (11/27 0539) Pulse Rate:  [73-85] 85 (11/27 0539) Resp:  [18-20] 19 (11/27 0539) BP: (134-157)/(95-98) 157/98 (11/27 0539) SpO2:  [97 %-98 %] 97 % (11/27 0539) Last BM Date : 09/27/23  Intake/Output from previous day: No intake/output data recorded. Intake/Output this shift: No intake/output data recorded.  Alert and well appearing Unlabored respirations Abd soft, mild distention, focally tender in RLQ  Lab Results:  Recent Labs    09/28/23 0516 09/29/23 0548  WBC 8.4 12.0*  HGB 13.8 14.7  HCT 42.3 44.6  PLT 282 349   BMET Recent Labs    09/28/23 0516 09/29/23 0548  NA 134* 139  K 4.0 4.2  CL 103 103  CO2 25 24  GLUCOSE 110* 93  BUN 20 21*  CREATININE 0.94 1.03  CALCIUM 8.4* 9.1   Studies/Results: No results found.  Anti-infectives: Anti-infectives (From admission, onward)    Start     Dose/Rate Route Frequency Ordered Stop   09/24/23 2000  piperacillin-tazobactam (ZOSYN) IVPB 3.375 g        3.375 g 12.5 mL/hr over 240 Minutes Intravenous Every 8 hours 09/24/23 1430     09/24/23 1315  piperacillin-tazobactam (ZOSYN) IVPB 3.375 g        3.375 g 100 mL/hr over 30 Minutes Intravenous  Once 09/24/23 1312 09/24/23 1355       Assessment/Plan: Diverticulitis with microperforation -afebrile, WBC up to 12 today - on FLD, some nausea today, NPO for possible drain placement - continue IV abx  - Mild AKI has resolved  - CT reviewed today but not read.  Appears to have a bit more pneumoperitoneum under his diaphragm than on original CT scan, but not significant.  He does appear to have developed an abscess in the RLQ.  Will have IR review.  This could be bowel with an air fluid level, but I think this is separate and abscess.   -  discussed plans to have IR review CT to see if anything is amendable to drainage.  He was agreeable. - d/w primary service as well  FEN - NPO, IVFs VTE - Lovenox ID - zosyn   LOS: 5 days    Letha Cape 09/29/2023

## 2023-09-29 NOTE — Plan of Care (Signed)
  Problem: Education: Goal: Knowledge of General Education information will improve Description: Including pain rating scale, medication(s)/side effects and non-pharmacologic comfort measures Outcome: Progressing   Problem: Clinical Measurements: Goal: Ability to maintain clinical measurements within normal limits will improve Outcome: Progressing Goal: Will remain free from infection Outcome: Progressing   Problem: Nutrition: Goal: Adequate nutrition will be maintained Outcome: Progressing   Problem: Coping: Goal: Level of anxiety will decrease Outcome: Progressing   Problem: Pain Management: Goal: General experience of comfort will improve Outcome: Progressing   Problem: Safety: Goal: Ability to remain free from injury will improve Outcome: Progressing   Problem: Skin Integrity: Goal: Risk for impaired skin integrity will decrease Outcome: Progressing

## 2023-09-29 NOTE — Plan of Care (Signed)

## 2023-09-29 NOTE — Progress Notes (Signed)
Victorino Dike from Microbiology called regarding specimen sent ina syringe by IR without patient label. Will pass it to the day shift RN tomorrow to confirm that it is the right patient.

## 2023-09-29 NOTE — Progress Notes (Signed)
  Progress Note   Patient: Henry Paul AOZ:[id number] DOB: 16-May-1966 DOA: 09/24/2023     5 DOS: the patient was seen and examined on 09/29/2023 at 10:15AM      Brief hospital course: Henry Paul is a 57 year old male with PMH diverticulosis, GERD, hepatic steatosis who presented with abdominal pain.  He has approximately a couple episodes of diverticulitis yearly which is treated at home with antibiotics.  He says he has not been hospitalized for diverticulitis in approximately 5 years.  His last colonoscopy was about 8 years ago.  After starting antibiotics at home he developed some chills but no fever and due to not feeling better, presented to the ER.  CT abdomen/pelvis showed moderate diverticulitis with adjacent free air consistent with perforation.  He was started on Zosyn and general surgery was consulted.     Assessment and Plan: *Sepsis due to sigmoid diverticulitis with perforation Still tender on exam, white count elevated, still with nausea. - Continue IV antibiotics - Follow repeat CT - Consult general surgery, appreciate recommendations - Consult IR   BPH - Continue Flomax          Subjective: Patient still has nausea, tenderness in the right lower quadrant.  Still tachycardic, still generalized malaise.  Repeat CT pending today.     Physical Exam: BP (!) 150/100 (BP Location: Right Arm)   Pulse (!) 102   Temp 97.8 F (36.6 C) (Oral)   Resp 16   Ht 5\' 9"  (1.753 m)   Wt 83.9 kg   SpO2 97%   BMI 27.32 kg/m   Adult male, lying in bed, interactive and appropriate, no acute distress Tachycardic, regular, no murmurs, no pitting edema Respiratory normal, lungs clear without rales or wheezes He is tender and guarding in the right lower quadrant, no rigidity or rebound Attention normal, affect appropriate, judgment and insight appear normal    Data Reviewed: Discussed with general surgery team CBC shows worsening leukocytosis, hemoglobin platelets  normal Basic metabolic panel normal  Family Communication: Present    Disposition: Status is: Inpatient         Author: Alberteen Sam, MD 09/29/2023 4:14 PM  For on call review [email or web address].

## 2023-09-29 NOTE — Progress Notes (Signed)
Oral contrast started at [facility]

## 2023-09-30 DIAGNOSIS — K5720 Diverticulitis of large intestine with perforation and abscess without bleeding: Secondary | ICD-10-CM | POA: Diagnosis not present

## 2023-09-30 DIAGNOSIS — N400 Enlarged prostate without lower urinary tract symptoms: Secondary | ICD-10-CM

## 2023-09-30 LAB — CBC WITH DIFFERENTIAL/PLATELET
Abs Immature Granulocytes: 0.27 10*3/uL — ABNORMAL HIGH (ref 0.00–0.07)
Basophils Absolute: 0.1 10*3/uL (ref 0.0–0.1)
Basophils Relative: 1 %
Eosinophils Absolute: 0.4 10*3/uL (ref 0.0–0.5)
Eosinophils Relative: 3 %
HCT: 44.1 % (ref 39.0–52.0)
Hemoglobin: 14.5 g/dL (ref 13.0–17.0)
Immature Granulocytes: 2 %
Lymphocytes Relative: 15 %
Lymphs Abs: 1.8 10*3/uL (ref 0.7–4.0)
MCH: 30.2 pg (ref 26.0–34.0)
MCHC: 32.9 g/dL (ref 30.0–36.0)
MCV: 91.9 fL (ref 80.0–100.0)
Monocytes Absolute: 0.9 10*3/uL (ref 0.1–1.0)
Monocytes Relative: 7 %
Neutro Abs: 8.7 10*3/uL — ABNORMAL HIGH (ref 1.7–7.7)
Neutrophils Relative %: 72 %
Platelets: 394 10*3/uL (ref 150–400)
RBC: 4.8 MIL/uL (ref 4.22–5.81)
RDW: 12.8 % (ref 11.5–15.5)
WBC: 12.1 10*3/uL — ABNORMAL HIGH (ref 4.0–10.5)
nRBC: 0 % (ref 0.0–0.2)

## 2023-09-30 LAB — BASIC METABOLIC PANEL
Anion gap: 12 (ref 5–15)
BUN: 21 mg/dL — ABNORMAL HIGH (ref 6–20)
CO2: 19 mmol/L — ABNORMAL LOW (ref 22–32)
Calcium: 8.5 mg/dL — ABNORMAL LOW (ref 8.9–10.3)
Chloride: 101 mmol/L (ref 98–111)
Creatinine, Ser: 1.01 mg/dL (ref 0.61–1.24)
GFR, Estimated: >60 mL/min (ref >60)
Glucose, Bld: 87 mg/dL (ref 70–99)
Potassium: 4 mmol/L (ref 3.5–5.1)
Sodium: 132 mmol/L — ABNORMAL LOW (ref 135–145)

## 2023-09-30 LAB — MAGNESIUM: Magnesium: 2.3 mg/dL (ref 1.7–2.4)

## 2023-09-30 NOTE — Progress Notes (Signed)
Subjective/Chief Complaint: Feels better today after getting drain yesterday   Objective: Vital signs in last 24 hours: Temp:  [97.6 F (36.4 C)-98.4 F (36.9 C)] 98.4 F (36.9 C) (11/28 0518) Pulse Rate:  [88-102] 88 (11/28 0518) Resp:  [15-20] 20 (11/28 0518) BP: (139-155)/(91-104) 152/98 (11/28 0518) SpO2:  [95 %-98 %] 97 % (11/28 0518) Last BM Date : 09/29/23  Intake/Output from previous day: 11/27 0701 - 11/28 0700 In: 10  Out: 150 [Drains:150] Intake/Output this shift: No intake/output data recorded.  General appearance: alert and cooperative Resp: clear to auscultation bilaterally Cardio: regular rate and rhythm GI: soft, mild tenderness lower  Lab Results:  Recent Labs    09/29/23 0548 09/30/23 0543  WBC 12.0* 12.1*  HGB 14.7 14.5  HCT 44.6 44.1  PLT 349 394   BMET Recent Labs    09/29/23 0548 09/30/23 0543  NA 139 132*  K 4.2 4.0  CL 103 101  CO2 24 19*  GLUCOSE 93 87  BUN 21* 21*  CREATININE 1.03 1.01  CALCIUM 9.1 8.5*   PT/INR Recent Labs    09/29/23 1037  LABPROT 14.2  INR 1.1   ABG No results for input(s): "PHART", "HCO3" in the last 72 hours.  Invalid input(s): "PCO2", "PO2"  Studies/Results: CT GUIDED PERITONEAL/RETROPERITONEAL FLUID DRAIN BY PERC CATH  Result Date: 09/29/2023 INDICATION: [id number] Colonic diverticular abscess [id number] EXAM: CT-GUIDED ABSCESS DRAINAGE CATHETER PLACEMENT COMPARISON:  CT AP, 09/29/2023 MEDICATIONS: The patient is currently admitted to the hospital and receiving intravenous antibiotics. The antibiotics were administered within an appropriate time frame prior to the initiation of the procedure. ANESTHESIA/SEDATION: Moderate (conscious) sedation was employed during this procedure. A total of Versed 4 mg and Fentanyl 100 mcg was administered intravenously. Moderate Sedation Time: 18 minutes. The patient's level of consciousness and vital signs were monitored continuously by radiology nursing throughout  the procedure under my direct supervision. CONTRAST:  None COMPLICATIONS: None immediate. PROCEDURE: RADIATION DOSE REDUCTION: This exam was performed according to the departmental dose-optimization program which includes automated exposure control, adjustment of the mA and/or kV according to patient size and/or use of iterative reconstruction technique. Informed written consent was obtained from the patient and/or patient's representative after a discussion of the risks, benefits and alternatives to treatment. The patient was placed supine on the CT gantry and a pre procedural CT was performed re-demonstrating the known abscess/fluid collection within the RIGHT lower quadrant. The procedure was planned. A timeout was performed prior to the initiation of the procedure. The RIGHT lower quadrant was prepped and draped in the usual sterile fashion. The overlying soft tissues were anesthetized with 1% lidocaine with epinephrine. Appropriate trajectory was planned with the use of a 22 gauge spinal needle. An 18 gauge trocar needle was advanced into the abscess/fluid collection and a short Amplatz super stiff wire was coiled within the collection. Appropriate positioning was confirmed with a limited CT scan. The tract was serially dilated allowing placement of a 12 Fr drainage catheter. Appropriate positioning was confirmed with a limited postprocedural CT scan. 5 mL of purulent fluid was aspirated as the sample. The tube was connected to a bulb suction and sutured in place. A dressing was placed. The patient tolerated the procedure well without immediate post procedural complication. IMPRESSION: Successful CT guided placement of a 12 Fr drainage catheter into the RIGHT lower quadrant abscess Sample aspiration of 5 mL of purulent fluid was obtained. Samples were sent to the laboratory as requested by the ordering clinical team.  Roanna Banning, MD [facility]  Electronically Signed   By: Roanna Banning M.D.   On: 09/29/2023 19:36   CT ABDOMEN PELVIS W CONTRAST  Result Date: 09/29/2023 CLINICAL DATA:  Complication of diverticulitis. EXAM: CT ABDOMEN AND PELVIS WITH CONTRAST TECHNIQUE: Multidetector CT imaging of the abdomen and pelvis was performed using the standard protocol following bolus administration of intravenous contrast. RADIATION DOSE REDUCTION: This exam was performed according to the departmental dose-optimization program which includes automated exposure control, adjustment of the mA and/or kV according to patient size and/or use of iterative reconstruction technique. CONTRAST:  OMNIPAQUE IOHEXOL 300 MG/ML  SOLN COMPARISON:  CT 09/24/2023. FINDINGS: Lower chest: Bandlike opacity seen along bases have increased from previous. Atelectasis favored over infiltrate but recommend follow-up. Coronary artery calcifications are seen. Please correlate for other coronary risk factors. Trace pleural fluid now identified as well. Hepatobiliary: Diffuse fatty liver infiltration. Previous cholecystectomy. Patent portal vein. Pancreas: Unremarkable. No pancreatic ductal dilatation or surrounding inflammatory changes. Spleen: Normal in size without focal abnormality. Adrenals/Urinary Tract: Adrenal glands are preserved. No enhancing renal mass. Once again Bosniak 1 and 2 bilateral renal cysts are identified. The ureters have normal course and caliber down to the bladder. Preserved contours of the urinary bladder. Stomach/Bowel: Oral contrast was administered. The stomach is nondilated. There is increasing diameter of several loops of small bowel now approaching up to 4.4 cm. No abrupt transition. More distally several fluid-filled loops are also seen which are nondilated. Changes are most consistent with a ileus rather than developing obstruction. The oral contrast is not reached the large bowel. Large bowel is relatively decompressed with scattered colonic  diverticula. Once again changes of stranding in the pelvis are seen. Extraluminal air and fluid anterior to the sigmoid colon again identified and small pockets. There is larger collection in the right lower quadrant which has significantly increased. This abuts the cecum and terminal ileum measuring 11.2 by 5.0 by 7.3 cm and could be a developing abscess. There is increasing free air throughout the abdomen. Increasing mesenteric stranding and ill-defined fluid. Vascular/Lymphatic: Aortic atherosclerosis. No enlarged abdominal or pelvic lymph nodes. Several prominent mesenteric nodes are seen which very well could be reactive. Reproductive: Prostate is unremarkable. Other: Mild anasarca. Musculoskeletal: Mild degenerative changes along the spine. Bridging osteophytes along the lower thoracic region. IMPRESSION: Worsening free air, stranding and fluid. Please correlate for worsening signs of perforation and any clinical evidence of peritonitis. Developing fluid and gas collection in the right hemipelvis measuring up to 11.2 x 5.0 x 7.3 cm. Possible abscess. Worsening small bowel dilatation. Again favor ileus over developing obstruction. Fatty liver infiltration. Increasing bandlike opacity at the lung bases and tiny effusions. Recommend follow up Critical Value/emergent results were called by telephone at the time of interpretation on 09/29/2023 at 9:23 am PST to provider [facility] , who verbally acknowledged these results. Electronically Signed   By: Karen Kays M.D.   On: 09/29/2023 12:40    Anti-infectives: Anti-infectives (From admission, onward)    Start     Dose/Rate Route Frequency Ordered Stop   09/24/23 2000  piperacillin-tazobactam (ZOSYN) IVPB 3.375 g        3.375 g 12.5 mL/hr over 240 Minutes Intravenous Every 8 hours 09/24/23 1430     09/24/23 1315  piperacillin-tazobactam (ZOSYN) IVPB 3.375 g        3.375 g 100 mL/hr over 30 Minutes Intravenous  Once 09/24/23 1312 09/24/23 1355  Assessment/Plan: s/p * No surgery found * Advance diet. Allow clears Continue IV zosyn Diverticulitis with microperforation -afebrile, WBC up to 12 today - on FLD, some nausea today, NPO for possible drain placement - continue IV abx  - Mild AKI has resolved  - CT reviewed today but not read.  Appears to have a bit more pneumoperitoneum under his diaphragm than on original CT scan, but not significant.  He does appear to have developed an abscess in the RLQ.  Will have IR review.  This could be bowel with an air fluid level, but I think this is separate and abscess.   - discussed plans to have IR review CT to see if anything is amendable to drainage.  He was agreeable. - d/w primary service as well   FEN - NPO, IVFs VTE - Lovenox ID - [email or web address]  LOS: 6 days    Henry Paul 09/30/2023

## 2023-09-30 NOTE — Progress Notes (Signed)
  Progress Note   Patient: Henry Paul [id number]:[id number] DOB: 1966/08/01 DOA: 09/24/2023     6 DOS: the patient was seen and examined on 09/30/2023       Brief hospital course: Mr. Salinger is a 57 year old male with PMH diverticulosis, GERD, hepatic steatosis who presented with abdominal pain.  He has approximately a couple episodes of diverticulitis yearly which is treated at home with antibiotics.  He says he has not been hospitalized for diverticulitis in approximately 5 years.  His last colonoscopy was about 8 years ago.  After starting antibiotics at home he developed some chills but no fever and due to not feeling better, presented to the ER.  CT abdomen/pelvis showed moderate diverticulitis with adjacent free air consistent with perforation.  He was started on Zosyn and general surgery was consulted.     Assessment and Plan: *Sepsis due to sigmoid diverticulitis with perforation Tenderness improved.  Drain in yesterday. - Continue IV antibiotics - Consult general surgery, appreciate recommendations - ADAT  BPH - Continue Flomax          Subjective: Pain improving.  No new fever.  No confusion.     Physical Exam: BP (!) 152/98 (BP Location: Right Arm)   Pulse 88   Temp 98.4 F (36.9 C) (Tympanic)   Resp 20   Ht 5\' 9"  (1.753 m)   Wt 83.9 kg   SpO2 97%   BMI 27.32 kg/m   Adult male, lying in bed, interactive and appropriate, no acute distress Tachycardic, regular, no murmurs, no pitting edema Respiratory normal, lungs clear without rales or wheezes He is tender and guarding in the right lower quadrant, but improved from yesterday.  No rigidity or rebound Attention normal, affect appropriate, judgment and insight appear normal    Data Reviewed: WBC unchanged.  Family Communication: None present    Disposition: Status is: Inpatient Requires ongoing IV antibiotics        Author: Alberteen Sam, MD 09/30/2023 12:58 PM  For on call  review [email or web address].

## 2023-10-01 ENCOUNTER — Other Ambulatory Visit (HOSPITAL_COMMUNITY): Payer: Self-pay

## 2023-10-01 DIAGNOSIS — K5720 Diverticulitis of large intestine with perforation and abscess without bleeding: Secondary | ICD-10-CM | POA: Diagnosis not present

## 2023-10-01 LAB — CBC
HCT: 45.3 % (ref 39.0–52.0)
Hemoglobin: 14.9 g/dL (ref 13.0–17.0)
MCH: 30.1 pg (ref 26.0–34.0)
MCHC: 32.9 g/dL (ref 30.0–36.0)
MCV: 91.5 fL (ref 80.0–100.0)
Platelets: 405 10*3/uL — ABNORMAL HIGH (ref 150–400)
RBC: 4.95 MIL/uL (ref 4.22–5.81)
RDW: 12.7 % (ref 11.5–15.5)
WBC: 11.2 10*3/uL — ABNORMAL HIGH (ref 4.0–10.5)
nRBC: 0 % (ref 0.0–0.2)

## 2023-10-01 MED ORDER — NORMAL SALINE FLUSH 0.9 % IV SOLN
INTRAVENOUS | 3 refills | Status: DC
  Filled 2023-10-01: qty 300, 30d supply, fill #0

## 2023-10-01 MED ORDER — AMOXICILLIN-POT CLAVULANATE 875-125 MG PO TABS
1.0000 | ORAL_TABLET | Freq: Two times a day (BID) | ORAL | 0 refills | Status: DC
  Filled 2023-10-01: qty 20, 10d supply, fill #0

## 2023-10-01 NOTE — Discharge Summary (Signed)
Physician Discharge Summary   Patient: Henry Paul MRN: [id number] DOB: August 16, 1966  Admit date:     09/24/2023  Discharge date: 10/01/23  Discharge Physician: Alberteen Sam   PCP: [name], MD     Recommendations at discharge:  Follow-up with general surgery as directed for perforated diverticulitis Follow-up with interventional radiology for repeat imaging in 10 to 14 days, and percutaneous drain removal Follow-up with PCP for perforated diverticulitis     Discharge Diagnoses: Principal Problem:   Sepsis due to perforation of sigmoid colon due to diverticulitis Active Problems:   History of laparoscopic appendectomy   GERD (gastroesophageal reflux disease)   BPH (benign prostatic hyperplasia)       Hospital Course: 57 y.o. M with diverticulosis and prior diverticulitis who presented with fever chills, failed outpatient treatment of diverticulitis.  In the ER, CT showed adjacent free air consistent with perforation.         * Perforation of sigmoid colon due to diverticulitis Patient was admitted with tachycardia, leukocytosis, and perforated diverticulitis.  He was treated with Zosyn, but had no improvement.  General surgery were consulted, they recommended repeat CT imaging which showed fluid collection which was drained with percutaneous drain.  After percutaneous drain placement, the patient had considerable improvement, abdominal pain improved, white count improved, and he was able to advance to solid diet without difficulty.  He was discharged with new percutaneous drain, instructions on drain flushes, and follow-up imaging with interventional radiology for drain removal.  He was discharged with instructions for general surgery follow-up.  He was discharged with 10 more days Augmentin            The [facility] Controlled Substances Registry was reviewed for this patient prior to discharge.  Consultants: General  surgery Interventional radiology  Procedures performed: Percutaneous drain placement  Disposition: Home Diet recommendation:  Regular diet  DISCHARGE MEDICATION: Allergies as of 10/01/2023   No Known Allergies      Medication List     STOP taking these medications    ciprofloxacin 500 MG tablet Commonly known as: CIPRO   metroNIDAZOLE 250 MG tablet Commonly known as: FLAGYL       TAKE these medications    acetaminophen 500 MG tablet Commonly known as: TYLENOL Take 1,000 mg by mouth every 6 (six) hours as needed for mild pain (pain score 1-3) or moderate pain (pain score 4-6).   amoxicillin-clavulanate 875-125 MG tablet Commonly known as: AUGMENTIN Take 1 tablet by mouth 2 (two) times daily.   Normal Saline Flush 0.9 % Soln Flush abdominal drain with 5cc once daily.   pantoprazole 20 MG tablet Commonly known as: PROTONIX Take 20 mg by mouth every evening.   tamsulosin 0.4 MG Caps capsule Commonly known as: FLOMAX Take 0.4 mg by mouth every evening.               Discharge Care Instructions  (From admission, onward)           Start     Ordered   10/01/23 0000  Discharge wound care:       Comments: As described below in AVS   10/01/23 1135            Follow-up Information     Surgery, [facility] [place]. Schedule an appointment as soon as possible for a visit in 2 week(s).   Specialty: General Surgery Why: Call to make an appointment with one of the Colorectal surgeons for hospital follow up per Dr. Carolynne Edouard  Contact information: [address] [place] [address] [phone]         [facility] Follow up.   Why: Interventional Radiology [phone] Contact information: [address] [place] [address]        [name], MD. Schedule an appointment as soon as possible for a visit in 1 week(s).   Specialty: Internal Medicine Contact information: [address] [place] [address] [phone]                 Discharge Instructions     Discharge instructions   Complete by: As directed    **IMPORTANT DISCHARGE INSTRUCTIONS**   From Dr. Maryfrances Bunnell: You were admitted with perforated diverticulitis. Thankfully, the perforation was contained in a small fluid collection You had a drain placed in this fluid collection by an Interventional Radiologist See below for all specific drain instructions If you have concerns or quesitons about the drain, call the Interventional Radiology office at [phone].  They will schedule a repeat CT imaging in 10-14 days and they will determine when and if the drain needs to come out  You should ALSO follow up with a surgeon You were seen here by Dr. Carolynne Edouard, and he recommends you see one of his colorectal surgery partners in 2-4 weeks Call their office (also below in the TO DO section) to schedule that  Take the antibiotic Augmentin twice daily starting tonight for 10 days Follow up with your PCP in 7-10 days   Discharge wound care:   Complete by: As directed    As described below in AVS   Increase activity slowly   Complete by: As directed        Discharge Exam: Filed Weights   09/24/23 1041  Weight: 83.9 kg    General: Pt is alert, awake, not in acute distress Cardiovascular: RRR, nl S1-S2, no murmurs appreciated.   No LE edema.   Respiratory: Normal respiratory rate and rhythm.  CTAB without rales or wheezes. Abdominal: Abdomen soft and non-tender.  No distension or HSM.   Neuro/Psych: Strength symmetric in upper and lower extremities.  Judgment and insight appear normal.   Condition at discharge: good  The results of significant diagnostics from this hospitalization (including imaging, microbiology, ancillary and laboratory) are listed below for reference.   Imaging Studies: CT GUIDED PERITONEAL/RETROPERITONEAL FLUID DRAIN BY [facility] CATH  Result Date: 09/29/2023 INDICATION:  [id number] Colonic diverticular abscess [id number] EXAM: CT-GUIDED ABSCESS DRAINAGE CATHETER PLACEMENT COMPARISON:  CT AP, 09/29/2023 MEDICATIONS: The patient is currently admitted to the hospital and receiving intravenous antibiotics. The antibiotics were administered within an appropriate time frame prior to the initiation of the procedure. ANESTHESIA/SEDATION: Moderate (conscious) sedation was employed during this procedure. A total of Versed 4 mg and Fentanyl 100 mcg was administered intravenously. Moderate Sedation Time: 18 minutes. The patient's level of consciousness and vital signs were monitored continuously by radiology nursing throughout the procedure under my direct supervision. CONTRAST:  None COMPLICATIONS: None immediate. PROCEDURE: RADIATION DOSE REDUCTION: This exam was performed according to the departmental dose-optimization program which includes automated exposure control, adjustment of the mA and/or kV according to patient size and/or use of iterative reconstruction technique. Informed written consent was obtained from the patient and/or patient's representative after a discussion of the risks, benefits and alternatives to treatment. The patient was placed supine on the CT gantry and a pre procedural CT was performed re-demonstrating the known abscess/fluid collection within the RIGHT  lower quadrant. The procedure was planned. A timeout was performed prior to the initiation of the procedure. The RIGHT lower quadrant was prepped and draped in the usual sterile fashion. The overlying soft tissues were anesthetized with 1% lidocaine with epinephrine. Appropriate trajectory was planned with the use of a 22 gauge spinal needle. An 18 gauge trocar needle was advanced into the abscess/fluid collection and a short Amplatz super stiff wire was coiled within the collection. Appropriate positioning was confirmed with a limited CT scan. The tract was serially dilated allowing placement of a 12 Fr drainage  catheter. Appropriate positioning was confirmed with a limited postprocedural CT scan. 5 mL of purulent fluid was aspirated as the sample. The tube was connected to a bulb suction and sutured in place. A dressing was placed. The patient tolerated the procedure well without immediate post procedural complication. IMPRESSION: Successful CT guided placement of a 12 Fr drainage catheter into the RIGHT lower quadrant abscess Sample aspiration of 5 mL of purulent fluid was obtained. Samples were sent to the laboratory as requested by the ordering clinical team. Roanna Banning, MD [facility] Radiology Electronically Signed   By: Roanna Banning M.D.   On: 09/29/2023 19:36   CT ABDOMEN PELVIS W CONTRAST  Result Date: 09/29/2023 CLINICAL DATA:  Complication of diverticulitis. EXAM: CT ABDOMEN AND PELVIS WITH CONTRAST TECHNIQUE: Multidetector CT imaging of the abdomen and pelvis was performed using the standard protocol following bolus administration of intravenous contrast. RADIATION DOSE REDUCTION: This exam was performed according to the departmental dose-optimization program which includes automated exposure control, adjustment of the mA and/or kV according to patient size and/or use of iterative reconstruction technique. CONTRAST:  OMNIPAQUE IOHEXOL 300 MG/ML  SOLN COMPARISON:  CT 09/24/2023. FINDINGS: Lower chest: Bandlike opacity seen along bases have increased from previous. Atelectasis favored over infiltrate but recommend follow-up. Coronary artery calcifications are seen. Please correlate for other coronary risk factors. Trace pleural fluid now identified as well. Hepatobiliary: Diffuse fatty liver infiltration. Previous cholecystectomy. Patent portal vein. Pancreas: Unremarkable. No pancreatic ductal dilatation or surrounding inflammatory changes. Spleen: Normal in size without focal abnormality. Adrenals/Urinary Tract: Adrenal glands are preserved. No  enhancing renal mass. Once again Bosniak 1 and 2 bilateral renal cysts are identified. The ureters have normal course and caliber down to the bladder. Preserved contours of the urinary bladder. Stomach/Bowel: Oral contrast was administered. The stomach is nondilated. There is increasing diameter of several loops of small bowel now approaching up to 4.4 cm. No abrupt transition. More distally several fluid-filled loops are also seen which are nondilated. Changes are most consistent with a ileus rather than developing obstruction. The oral contrast is not reached the large bowel. Large bowel is relatively decompressed with scattered colonic diverticula. Once again changes of stranding in the pelvis are seen. Extraluminal air and fluid anterior to the sigmoid colon again identified and small pockets. There is larger collection in the right lower quadrant which has significantly increased. This abuts the cecum and terminal ileum measuring 11.2 by 5.0 by 7.3 cm and could be a developing abscess. There is increasing free air throughout the abdomen. Increasing mesenteric stranding and ill-defined fluid. Vascular/Lymphatic: Aortic atherosclerosis. No enlarged abdominal or pelvic lymph nodes. Several prominent mesenteric nodes are seen which very well could be reactive. Reproductive: Prostate is unremarkable. Other: Mild anasarca. Musculoskeletal: Mild degenerative changes along the spine. Bridging osteophytes along the lower thoracic region. IMPRESSION: Worsening free air, stranding and fluid. Please correlate for worsening  signs of perforation and any clinical evidence of peritonitis. Developing fluid and gas collection in the right hemipelvis measuring up to 11.2 x 5.0 x 7.3 cm. Possible abscess. Worsening small bowel dilatation. Again favor ileus over developing obstruction. Fatty liver infiltration. Increasing bandlike opacity at the lung bases and tiny effusions. Recommend follow up Critical Value/emergent results were  called by telephone at the time of interpretation on 09/29/2023 at 9:23 am PST to provider [facility] , who verbally acknowledged these results. Electronically Signed   By: Karen Kays M.D.   On: 09/29/2023 12:40   CT ABDOMEN PELVIS W CONTRAST  Result Date: 09/24/2023 CLINICAL DATA:  Acute generalized abdominal pain. EXAM: CT ABDOMEN AND PELVIS WITH CONTRAST TECHNIQUE: Multidetector CT imaging of the abdomen and pelvis was performed using the standard protocol following bolus administration of intravenous contrast. RADIATION DOSE REDUCTION: This exam was performed according to the departmental dose-optimization program which includes automated exposure control, adjustment of the mA and/or kV according to patient size and/or use of iterative reconstruction technique. CONTRAST:  OMNIPAQUE IOHEXOL 300 MG/ML  SOLN COMPARISON:  February 12, 2021. FINDINGS: Lower chest: Mild bibasilar subsegmental atelectasis is noted. Hepatobiliary: Hepatic steatosis. Status post cholecystectomy. No biliary dilatation. Pancreas: Unremarkable. No pancreatic ductal dilatation or surrounding inflammatory changes. Spleen: Normal in size without focal abnormality. Adrenals/Urinary Tract: Adrenal glands appear normal. Bilateral renal cysts are noted for which no further follow-up is required. No hydronephrosis or renal obstruction is noted. Urinary bladder is unremarkable. Stomach/Bowel: Status post appendectomy. Stomach is unremarkable. Mildly dilated small bowel loops are noted most likely representing ileus secondary to inflammation. Moderate sigmoid diverticulitis is noted with adjacent air in the peritoneal fat consistent with small perforation. Free air is also noted in the epigastric region. Wall thickening of small bowel loop is seen in the right lower quadrant most likely due to secondary inflammation. Vascular/Lymphatic: No significant vascular findings are present. No enlarged abdominal or pelvic lymph nodes.  Reproductive: Stable mild prostatic enlargement. Other: No abdominal wall hernia or abnormality. No abdominopelvic ascites. Musculoskeletal: No acute or significant osseous findings. IMPRESSION: Findings consistent with moderate diverticulitis with adjacent free air consistent with perforation. Free air is also noted in the epigastric region. Wall thickening of adjacent small bowel loop is noted in the pelvis representing secondary inflammation. Mildly dilated small bowel loops are noted more superiorly most likely due to inflammatory ileus. Critical Value/emergent results were called by telephone at the time of interpretation on 09/24/2023 at 1:08 pm to provider AMJAD ALI , who verbally acknowledged these results. Hepatic steatosis. Stable mild prostatic enlargement. Mild bibasilar subsegmental atelectasis. Electronically Signed   By: Lupita Raider M.D.   On: 09/24/2023 13:08    Microbiology: Results for orders placed or performed during the hospital encounter of 09/24/23  Aerobic/Anaerobic Culture w Gram Stain (surgical/deep wound)     Status: None (Preliminary result)   Collection Time: 09/29/23  5:39 PM   Specimen: Abscess  Result Value Ref Range Status   Specimen Description   Final    ABSCESS Performed at [facility], [address], [place], [place] [address]    Special Requests   Final    NONE Performed at [facility], [address], [place], [place] [address]    Gram Stain   Final    ABUNDANT WBC PRESENT, PREDOMINANTLY PMN RARE GRAM POSITIVE RODS RARE HYPHAL ELEMENTS SEEN Performed at [facility], [address], [place], [place] [address]  Culture PENDING  Incomplete   Report Status PENDING  Incomplete    Labs: CBC: Recent Labs  Lab 09/26/23 0534 09/27/23 0444 09/28/23 0516 09/29/23 0548 09/30/23 0543 10/01/23 0550  WBC 13.0* 10.6* 8.4 12.0* 12.1* 11.2*  NEUTROABS 10.8* 7.8* 5.5 8.4* 8.7*  --   HGB 13.7 13.3 13.8 14.7  14.5 14.9  HCT 43.3 41.3 42.3 44.6 44.1 45.3  MCV 95.2 95.2 93.4 92.9 91.9 91.5  PLT 217 266 282 349 394 405*   Basic Metabolic Panel: Recent Labs  Lab 09/26/23 0534 09/27/23 0444 09/28/23 0516 09/29/23 0548 09/30/23 0543  NA 134* 136 134* 139 132*  K 4.2 4.2 4.0 4.2 4.0  CL 106 107 103 103 101  CO2 22 23 25 24  19*  GLUCOSE 101* 97 110* 93 87  BUN 29* 25* 20 21* 21*  CREATININE 1.12 1.04 0.94 1.03 1.01  CALCIUM 8.1* 8.4* 8.4* 9.1 8.5*  MG 2.5* 2.3 2.3 2.2 2.3   Liver Function Tests: No results for input(s): "AST", "ALT", "ALKPHOS", "BILITOT", "PROT", "ALBUMIN" in the last 168 hours. CBG: No results for input(s): "GLUCAP" in the last 168 hours.  Discharge time spent: approximately 35 minutes spent on discharge counseling, evaluation of patient on day of discharge, and coordination of discharge planning with nursing, social work, pharmacy and case management  Signed: Alberteen Sam, MD [facility] 10/01/2023

## 2023-10-01 NOTE — Plan of Care (Signed)

## 2023-10-01 NOTE — Progress Notes (Signed)
   IR PROGRESS NOTE:  Sigmoid diverticulum drain placed 11/27 Drain site appropriately dressed, c/d/i  Site mildly tender to palpation. Minimal blood-tinged serous output. Flushes and aspirates well.  Patient instructed on daily drain care with flushing. Record daily outputs upon discharge. Follow-up in clinic in 10-14 days for line study and CT. Orders in. Patient will be contacted to schedule.  Buzzy Han, PA-C

## 2023-10-01 NOTE — Discharge Instructions (Signed)
Interventional Radiology Percutaneous Abscess Drain Placement After Care   This sheet gives you information about how to care for yourself after your procedure. Your health care provider may also give you more specific instructions. Your drain was placed by an interventional radiologist with [facility]. If you have questions or concerns, contact [facility] at [phone].   What is a percutaneous drain?   A drain is a small plastic tube (catheter) that goes into the fluid collection in your body through your skin.   How long will I need the drain?   How long the drain needs to stay in is determined by where the drain is, how much comes out of the drain each day and if you are having any other surgical procedures.   Interventional radiology will determine when it is time to remove the drain. It is important to follow up as directed so that the drain can be removed as soon as it is safe to do so.   What can I expect after the procedure?   After the procedure, it is common to have:   A small amount of bruising and discomfort in the area where the drainage tube (catheter) was placed.   Sleepiness and fatigue. This should go away after the medicines you were given have worn off.   Follow these instructions at home:   Insertion site care   Check your insertion site when you change the bandage. Check for:   More redness, swelling, or pain.   More fluid or blood.   Warmth.   Pus or a bad smell.   When caring for your insertion site:   Wash your hands with soap and water for at least 20 seconds before and after you change your bandage (dressing). If soap and water are not available, use hand sanitizer.   You do not need to change your dressing everyday if it is clean and dry. Change your dressing every 3 days or as needed when it is soiled, wet or becoming dislodged. You will need to change your dressing each time you shower.   Leave stitches (sutures), skin  glue, or adhesive strips in place. These skin closures may need to stay in place for 2 weeks or longer. If adhesive strip edges start to loosen and curl up, you may trim the loose edges. Do not remove adhesive strips completely unless your health care provider tells you to do so.    Catheter care   Flush the catheter once per day with 5 mL of 0.9% normal saline unless you are told otherwise by your healthcare provider. This helps to prevent clogs in the catheter.   To disconnect the drain, turn the clear plastic tube to the left. Attach the saline syringe by placing it on the white end of the drain and turning gently to the right. Once attached gently push the plunger to the 5 mL mark. After you are done flushing, disconnect the syringe by turning to the left and reattach your drainage container    If you have a bulb please be sure the bulb is charged after reconnecting it - to do this pinch the bulb between your thumb and first finger and close the stopper located on the top of the bulb.     Check for fluid leaking from around your catheter (instead of fluid draining through your catheter). This may be a sign that the drain is no longer working correctly.   Write down the following information every time  you empty your bag:   The date and time.   The amount of drainage.   Activity   Rest at home for 1-2 days after your procedure.   For the first 48 hours do not lift anything more than 10 lbs (about a gallon of milk). You may perform moderate activities/exercise. Please avoid strenuous activities during this time.   Avoid any activities which may pull on your drain as this can cause your drain to become dislodged.   If you were given a sedative during the procedure, it can affect you for several hours. Do not drive or operate machinery until your health care provider says that it is safe.   General instructions   For mild pain take over-the-counter medications as needed for pain such  as Tylenol or Advil. If you are experiencing severe pain please call our office as this may indicate an issue with your drain.    If you were prescribed an antibiotic medicine, take it as told by your health care provider. Do not stop using the antibiotic even if you start to feel better.   You may shower 24 hours after the drain is placed. To do this cover the insertion site with a water tight material such as saran wrap and seal the edges with tape, you may also purchase waterproof dressings at your local drug store. Shower as usual and then remove the water tight dressing and any gauze/tape underneath it once you have exited the shower and dried off. Allow the area to air dry or pat dry with a clean towel. Once the skin is completely dry place a new gauze dressing. It is important to keep the site dry at all times to prevent infection.   Do not submerge the drain - this means you cannot take baths, swim, use a hot tub, etc. until the drain is removed.    Do not use any products that contain nicotine or tobacco, such as cigarettes, e-cigarettes, and chewing tobacco. If you need help quitting, ask your health care provider.   Keep all follow-up visits as told by your health care provider. This is important.   Contact a health care provider if:   You have less than 10 mL of drainage a day for 2-3 days in a row, or as directed by your health care provider.   You have any of these signs of infection:   More redness, swelling, or pain around your incision area.   More fluid or blood coming from your incision area.   Warmth coming from your incision area.   Pus or a bad smell coming from your incision area.   You have fluid leaking from around your catheter (instead of through your catheter).   You are unable to flush the drain.   You have a fever or chills.   You have pain that does not get better with medicine.   You have not been contacted to schedule a drain follow up appointment  within 10 days of discharge from the hospital.   Please call [facility] at [phone] with any questions or concerns.   Get help right away if:   Your catheter comes out.   You suddenly stop having drainage from your catheter.   You suddenly have blood in the fluid that is draining from your catheter.   You become dizzy or you faint.   You develop a rash.   You have nausea or vomiting.   You have difficulty breathing or  you feel short of breath.   You develop chest pain.   You have problems with your speech or vision.   You have trouble balancing or moving your arms or legs.   Summary   It is common to have a small amount of bruising and discomfort in the area where the drainage tube (catheter) was placed. You may also have minor discomfort with movement while the drain is in place.   Flush the drain once per day with 5 mL of 0.9% normal saline (unless you were told otherwise by your healthcare provider).    Record the amount of drainage from the bag every time you empty it.   Change the dressing every 3 days or earlier if soiled/wet. Keep the skin dry under the dressing.   You may shower with the drain in place. Do not submerge the drain (no baths, swimming, hot tubs, etc.).   Contact [facility] at [phone] if you have more redness, swelling, or pain around your incision area or if you have pain that does not get better with medicine.   This information is not intended to replace advice given to you by your health care provider. Make sure you discuss any questions you have with your health care provider.   Document Revised: 01/22/2022 Document Reviewed: 10/14/2019   [employer]vier Patient Education  2023 [employer]     Interventional Radiology Drain Record   Empty your drain at least once per day. You may empty it as often as needed. Use this form to write down the amount of fluid that has collected in the drainage container. Bring this  form with you to your follow-up visits. Please call [facility] at [phone] with any questions or concerns prior to your appointment.   Drain #1 location: ___________________   Date __________ Time __________ Amount __________   Date __________ Time __________ Amount __________   Date __________ Time __________ Amount __________   Date __________ Time __________ Amount __________   Date __________ Time __________ Amount __________   Date __________ Time __________ Amount __________   Date __________ Time __________ Amount __________   Date __________ Time __________ Amount __________   Date __________ Time __________ Amount __________   Date __________ Time __________ Amount __________   Date __________ Time __________ Amount __________   Date __________ Time __________ Amount __________   Date __________ Time __________ Amount __________   Date __________ Time __________ Amount __________

## 2023-10-01 NOTE — Progress Notes (Signed)
Subjective/Chief Complaint: No complaints   Objective: Vital signs in last 24 hours: Temp:  [98.2 F (36.8 C)-98.7 F (37.1 C)] 98.2 F (36.8 C) (11/29 0453) Pulse Rate:  [83-96] 96 (11/29 0453) Resp:  [18-20] 18 (11/29 0453) BP: (140-155)/(96-101) 155/98 (11/29 0453) SpO2:  [96 %-97 %] 97 % (11/29 0453) Last BM Date : 09/30/23  Intake/Output from previous day: 11/28 0701 - 11/29 0700 In: 1249.2 [P.O.:780; I.V.:10; IV Piggyback:449.2] Out: 25 [Drains:25] Intake/Output this shift: No intake/output data recorded.  General appearance: alert and cooperative Resp: clear to auscultation bilaterally Cardio: regular rate and rhythm GI: soft, nontender. Drain output serosang  Lab Results:  Recent Labs    09/30/23 0543 10/01/23 0550  WBC 12.1* 11.2*  HGB 14.5 14.9  HCT 44.1 45.3  PLT 394 405*   BMET Recent Labs    09/29/23 0548 09/30/23 0543  NA 139 132*  K 4.2 4.0  CL 103 101  CO2 24 19*  GLUCOSE 93 87  BUN 21* 21*  CREATININE 1.03 1.01  CALCIUM 9.1 8.5*   PT/INR Recent Labs    09/29/23 1037  LABPROT 14.2  INR 1.1   ABG No results for input(s): "PHART", "HCO3" in the last 72 hours.  Invalid input(s): "PCO2", "PO2"  Studies/Results: CT GUIDED PERITONEAL/RETROPERITONEAL FLUID DRAIN BY PERC CATH  Result Date: 09/29/2023 INDICATION: [id number] Colonic diverticular abscess [id number] EXAM: CT-GUIDED ABSCESS DRAINAGE CATHETER PLACEMENT COMPARISON:  CT AP, 09/29/2023 MEDICATIONS: The patient is currently admitted to the hospital and receiving intravenous antibiotics. The antibiotics were administered within an appropriate time frame prior to the initiation of the procedure. ANESTHESIA/SEDATION: Moderate (conscious) sedation was employed during this procedure. A total of Versed 4 mg and Fentanyl 100 mcg was administered intravenously. Moderate Sedation Time: 18 minutes. The patient's level of consciousness and vital signs were monitored continuously by radiology  nursing throughout the procedure under my direct supervision. CONTRAST:  None COMPLICATIONS: None immediate. PROCEDURE: RADIATION DOSE REDUCTION: This exam was performed according to the departmental dose-optimization program which includes automated exposure control, adjustment of the mA and/or kV according to patient size and/or use of iterative reconstruction technique. Informed written consent was obtained from the patient and/or patient's representative after a discussion of the risks, benefits and alternatives to treatment. The patient was placed supine on the CT gantry and a pre procedural CT was performed re-demonstrating the known abscess/fluid collection within the RIGHT lower quadrant. The procedure was planned. A timeout was performed prior to the initiation of the procedure. The RIGHT lower quadrant was prepped and draped in the usual sterile fashion. The overlying soft tissues were anesthetized with 1% lidocaine with epinephrine. Appropriate trajectory was planned with the use of a 22 gauge spinal needle. An 18 gauge trocar needle was advanced into the abscess/fluid collection and a short Amplatz super stiff wire was coiled within the collection. Appropriate positioning was confirmed with a limited CT scan. The tract was serially dilated allowing placement of a 12 Fr drainage catheter. Appropriate positioning was confirmed with a limited postprocedural CT scan. 5 mL of purulent fluid was aspirated as the sample. The tube was connected to a bulb suction and sutured in place. A dressing was placed. The patient tolerated the procedure well without immediate post procedural complication. IMPRESSION: Successful CT guided placement of a 12 Fr drainage catheter into the RIGHT lower quadrant abscess Sample aspiration of 5 mL of purulent fluid was obtained. Samples were sent to the laboratory as requested by the ordering clinical team. Cletis Athens  Mugweru, MD [facility]  [facility] Electronically Signed   By: Roanna Banning M.D.   On: 09/29/2023 19:36    Anti-infectives: Anti-infectives (From admission, onward)    Start     Dose/Rate Route Frequency Ordered Stop   09/24/23 2000  piperacillin-tazobactam (ZOSYN) IVPB 3.375 g        3.375 g 12.5 mL/hr over 240 Minutes Intravenous Every 8 hours 09/24/23 1430     09/24/23 1315  piperacillin-tazobactam (ZOSYN) IVPB 3.375 g        3.375 g 100 mL/hr over 30 Minutes Intravenous  Once 09/24/23 1312 09/24/23 1355       Assessment/Plan: s/p * No surgery found * Advance diet Switch to oral abx and plan for d/c later today. Will need to follow up with our colorectal docs in couple weeks Will also need to follow up in drain clinic with radiology  LOS: 7 days    Chevis Pretty III 10/01/2023

## 2023-10-02 ENCOUNTER — Other Ambulatory Visit (HOSPITAL_COMMUNITY): Payer: Self-pay

## 2023-10-04 ENCOUNTER — Other Ambulatory Visit: Payer: Self-pay | Admitting: General Surgery

## 2023-10-04 ENCOUNTER — Other Ambulatory Visit: Payer: Self-pay | Admitting: Family Medicine

## 2023-10-04 DIAGNOSIS — K5720 Diverticulitis of large intestine with perforation and abscess without bleeding: Secondary | ICD-10-CM

## 2023-10-04 MED ORDER — FLUCONAZOLE 200 MG PO TABS: 200.0000 mg | ORAL_TABLET | Freq: Every day | ORAL | 0 refills | Status: AC

## 2023-10-04 NOTE — Progress Notes (Signed)
Culture from abscess growing candida.  Will elect to treat this intra-abdominal infection.  Fluconazole 200 daily for 10 days.  Has Gen Surg and IR follow up pending.  Continue Augmentin as prescribed at discharge.  Spoke to patient, expressed understanding.

## 2023-10-06 ENCOUNTER — Other Ambulatory Visit (HOSPITAL_COMMUNITY): Payer: Self-pay

## 2023-10-07 LAB — AEROBIC/ANAEROBIC CULTURE W GRAM STAIN (SURGICAL/DEEP WOUND)

## 2023-10-13 ENCOUNTER — Ambulatory Visit
Admission: RE | Admit: 2023-10-13 | Discharge: 2023-10-13 | Disposition: A | Discharge status: Home / Self Care | Payer: 59 | Source: Ambulatory Visit | Attending: Radiology | Admitting: Radiology

## 2023-10-13 ENCOUNTER — Ambulatory Visit
Admission: RE | Admit: 2023-10-13 | Discharge: 2023-10-13 | Disposition: A | Discharge status: Home / Self Care | Payer: 59 | Source: Ambulatory Visit | Attending: General Surgery | Admitting: General Surgery

## 2023-10-13 DIAGNOSIS — K5720 Diverticulitis of large intestine with perforation and abscess without bleeding: Secondary | ICD-10-CM

## 2023-10-13 HISTORY — PX: IR RADIOLOGIST EVAL & MGMT: IMG5224

## 2023-10-13 MED ORDER — IOPAMIDOL (ISOVUE-300) INJECTION 61%
100.0000 mL | Freq: Once | INTRAVENOUS | Status: AC | PRN
  Administered 2023-10-13: 100 mL via INTRAVENOUS

## 2023-10-13 NOTE — Progress Notes (Signed)
Chief Complaint: Patient was seen in consultation today for abscess drain follow up.  History of Present Illness: Henry Paul is a 57 y.o. male status post percutaneous catheter drainage of a right lower quadrant peritoneal abscess presumably of diverticular origin on 09/29/23. He was discharged from the hospital on 11/29. Since discharge, he has completed antibiotics. Drainage over the last 5 days has been about 10 mL per day, which includes the 5 mL he has been flushing daily. He denies pain or fever. He has been tolerating a soft diet. He has a follow up appointment with Dr. Carolynne Edouard on Monday, 12/16.  Past Medical History:  Diagnosis Date   Fatty liver    GERD (gastroesophageal reflux disease)     Past Surgical History:  Procedure Laterality Date   APPENDECTOMY     CHOLECYSTECTOMY N/A 04/17/2021   Procedure: LAPAROSCOPIC CHOLECYSTECTOMY;  Surgeon: Abigail Miyamoto, MD;  Location: [facility];  Service: General;  Laterality: N/A;   COLONOSCOPY     FINGER SURGERY Left    ring and middle fingers reatttachment after amputation   WISDOM TOOTH EXTRACTION      Allergies: Patient has no known allergies.  Medications: Prior to Admission medications   Medication Sig Start Date End Date Taking? Authorizing Provider  acetaminophen (TYLENOL) 500 MG tablet Take 1,000 mg by mouth every 6 (six) hours as needed for mild pain (pain score 1-3) or moderate pain (pain score 4-6).    [provider]  amoxicillin-clavulanate (AUGMENTIN) 875-125 MG tablet Take 1 tablet by mouth 2 (two) times daily. 10/01/23   Danford, Earl Lites, MD  fluconazole (DIFLUCAN) 200 MG tablet Take 1 tablet (200 mg total) by mouth daily for 10 days. 10/04/23 10/14/23  DanfordEarl Lites, MD  pantoprazole (PROTONIX) 20 MG tablet Take 20 mg by mouth every evening. 04/02/21   [provider]  Sodium Chloride Flush (NORMAL SALINE FLUSH) 0.9 % SOLN Flush abdominal drain with 5cc once daily. 10/01/23    Allred, Rosalita Levan, PA-C  tamsulosin (FLOMAX) 0.4 MG CAPS capsule Take 0.4 mg by mouth every evening. 03/24/21   [provider]     No family history on file.  Social History   Socioeconomic History   Marital status: Married    Spouse name: Not on file   Number of children: Not on file   Years of education: Not on file   Highest education level: Not on file  Occupational History   Not on file  Tobacco Use   Smoking status: Never   Smokeless tobacco: Never  Substance and Sexual Activity   Alcohol use: No   Drug use: No   Sexual activity: Not on file  Other Topics Concern   Not on file  Social History Narrative   Works in [employer]   Social Determinants of Health   Financial Resource Strain: Not on file  Food Insecurity: No Food Insecurity (09/24/2023)   Hunger Vital Sign    Worried About Running Out of Food in the Last Year: Never true    Ran Out of Food in the Last Year: Never true  Transportation Needs: No Transportation Needs (09/24/2023)   PRAPARE - Administrator, Civil Service (Medical): No    Lack of Transportation (Non-Medical): No  Physical Activity: Not on file  Stress: Not on file  Social Connections: Not on file     Review of Systems: A 12 point ROS discussed and pertinent positives are indicated in the HPI above.  All other systems are negative.  Review of Systems  Constitutional: Negative.   Gastrointestinal: Negative.     Vital Signs: BP 124/86   Pulse 95   Temp 98 F (36.7 C)   Resp 20   SpO2 97%     Physical Exam Abdominal:     Palpations: Abdomen is soft.     Comments: Abdominal drain site clean and dry with no drainage or tenderness.      Imaging: DG Sinus/Fist Tube Chk-Non GI  Result Date: 10/13/2023 INDICATION: Status post percutaneous catheter drainage of large right lower quadrant peritoneal abscess on 09/29/2023 presumably of diverticular origin. CT earlier today demonstrates resolution of the  abscess. No significant clinical drainage from the drainage catheter over the last 5 days. EXAM: INJECTION OF PERCUTANEOUS ABSCESS DRAINAGE CATHETER UNDER FLUOROSCOPY MEDICATIONS: None ANESTHESIA/SEDATION: None CONTRAST:  30 mL Omnipaque 300 FLUOROSCOPY TIME:  1 minute and 18 seconds.  23.5 mGy. COMPLICATIONS: None immediate. PROCEDURE: The right lower quadrant percutaneous drainage catheter was injected utilizing sterile technique with contrast. Multiple fluoroscopic images and cine sequences were saved. Injected contrast was aspirated and the drainage catheter removed in its entirety. A dressing was applied to the catheter exit site. FINDINGS: Contrast injection demonstrates a small cavity corresponding to the pigtail portion of the drainage catheter. There is no fistula demonstrated communicating with adjacent bowel. Given resolution of the abscess by CT, low output and lack of demonstration of a fistula, the drainage catheter was removed today. IMPRESSION: Right lower quadrant abscess drainage catheter injection demonstrates decompressed abscess cavity with no fistula to adjacent bowel. The drainage catheter was removed. Electronically Signed   By: Irish Lack M.D.   On: 10/13/2023 13:58   CT ABDOMEN PELVIS W CONTRAST  Result Date: 10/13/2023 CLINICAL DATA:  Status post percutaneous catheter drainage right lower quadrant peritoneal abscess presumably of diverticular origin. EXAM: CT ABDOMEN AND PELVIS WITH CONTRAST TECHNIQUE: Multidetector CT imaging of the abdomen and pelvis was performed using the standard protocol following bolus administration of intravenous contrast. RADIATION DOSE REDUCTION: This exam was performed according to the departmental dose-optimization program which includes automated exposure control, adjustment of the mA and/or kV according to patient size and/or use of iterative reconstruction technique. CONTRAST:  ISOVUE-300 IOPAMIDOL (ISOVUE-300) INJECTION 61% COMPARISON:   09/29/2023 FINDINGS: Lower chest: Mild bibasilar atelectasis.  No pleural effusions. Hepatobiliary: Stable hepatic steatosis. Status post cholecystectomy. No biliary dilatation. Pancreas: Unremarkable. No pancreatic ductal dilatation or surrounding inflammatory changes. Spleen: Normal in size without focal abnormality. Adrenals/Urinary Tract: No adrenal masses. No hydronephrosis. Stable bilateral Bosniak 1 simple cysts of both kidneys require no follow-up. The bladder is unremarkable. Stomach/Bowel: No bowel obstruction, ileus or significant free intraperitoneal air. Diverticulosis and sigmoid colonic thickening again present with adjacent inflammatory stranding in the mesenteric fat. See below for details regarding the abscess drain and adjacent findings. Status post appendectomy. Vascular/Lymphatic: No significant vascular findings are present. No enlarged abdominal or pelvic lymph nodes. Reproductive: Mild prostatic enlargement. Other: Right lower quadrant percutaneous drainage catheter remains present. Complete decompression of previously drained abscess. Adjacent small loculations of extraluminal air medial to the drain in the pelvic mesenteric fat. No new fluid collections. Musculoskeletal: No acute or significant osseous findings. IMPRESSION: 1. Complete decompression of previously drained right lower quadrant abscess with percutaneous drainage catheter in place. Adjacent small loculations of extraluminal air medial to the drain in the pelvic mesenteric fat. No new fluid collections. 2. An abscess drain contrast injection was performed under fluoroscopy after the CT  study and is separately dictated. 3. Stable hepatic steatosis. 4. Stable bilateral Bosniak 1 simple cysts of both kidneys require no follow-up. 5. Mild prostatic enlargement. Electronically Signed   By: Irish Lack M.D.   On: 10/13/2023 13:47   CT GUIDED PERITONEAL/RETROPERITONEAL FLUID DRAIN BY PERC CATH  Result Date:  09/29/2023 INDICATION: [id number] Colonic diverticular abscess [id number] EXAM: CT-GUIDED ABSCESS DRAINAGE CATHETER PLACEMENT COMPARISON:  CT AP, 09/29/2023 MEDICATIONS: The patient is currently admitted to the hospital and receiving intravenous antibiotics. The antibiotics were administered within an appropriate time frame prior to the initiation of the procedure. ANESTHESIA/SEDATION: Moderate (conscious) sedation was employed during this procedure. A total of Versed 4 mg and Fentanyl 100 mcg was administered intravenously. Moderate Sedation Time: 18 minutes. The patient's level of consciousness and vital signs were monitored continuously by radiology nursing throughout the procedure under my direct supervision. CONTRAST:  None COMPLICATIONS: None immediate. PROCEDURE: RADIATION DOSE REDUCTION: This exam was performed according to the departmental dose-optimization program which includes automated exposure control, adjustment of the mA and/or kV according to patient size and/or use of iterative reconstruction technique. Informed written consent was obtained from the patient and/or patient's representative after a discussion of the risks, benefits and alternatives to treatment. The patient was placed supine on the CT gantry and a pre procedural CT was performed re-demonstrating the known abscess/fluid collection within the RIGHT lower quadrant. The procedure was planned. A timeout was performed prior to the initiation of the procedure. The RIGHT lower quadrant was prepped and draped in the usual sterile fashion. The overlying soft tissues were anesthetized with 1% lidocaine with epinephrine. Appropriate trajectory was planned with the use of a 22 gauge spinal needle. An 18 gauge trocar needle was advanced into the abscess/fluid collection and a short Amplatz super stiff wire was coiled within the collection. Appropriate positioning was confirmed with a limited CT scan. The tract was serially dilated allowing placement of  a 12 Fr drainage catheter. Appropriate positioning was confirmed with a limited postprocedural CT scan. 5 mL of purulent fluid was aspirated as the sample. The tube was connected to a bulb suction and sutured in place. A dressing was placed. The patient tolerated the procedure well without immediate post procedural complication. IMPRESSION: Successful CT guided placement of a 12 Fr drainage catheter into the RIGHT lower quadrant abscess Sample aspiration of 5 mL of purulent fluid was obtained. Samples were sent to the laboratory as requested by the ordering clinical team. Roanna Banning, MD [facility] Electronically Signed   By: Roanna Banning M.D.   On: 09/29/2023 19:36   CT ABDOMEN PELVIS W CONTRAST  Result Date: 09/29/2023 CLINICAL DATA:  Complication of diverticulitis. EXAM: CT ABDOMEN AND PELVIS WITH CONTRAST TECHNIQUE: Multidetector CT imaging of the abdomen and pelvis was performed using the standard protocol following bolus administration of intravenous contrast. RADIATION DOSE REDUCTION: This exam was performed according to the departmental dose-optimization program which includes automated exposure control, adjustment of the mA and/or kV according to patient size and/or use of iterative reconstruction technique. CONTRAST:  OMNIPAQUE IOHEXOL 300 MG/ML  SOLN COMPARISON:  CT 09/24/2023. FINDINGS: Lower chest: Bandlike opacity seen along bases have increased from previous. Atelectasis favored over infiltrate but recommend follow-up. Coronary artery calcifications are seen. Please correlate for other coronary risk factors. Trace pleural fluid now identified as well. Hepatobiliary: Diffuse fatty liver infiltration. Previous cholecystectomy. Patent portal vein. Pancreas: Unremarkable. No pancreatic ductal dilatation or surrounding inflammatory changes. Spleen: Normal in  size without focal abnormality. Adrenals/Urinary Tract: Adrenal glands are  preserved. No enhancing renal mass. Once again Bosniak 1 and 2 bilateral renal cysts are identified. The ureters have normal course and caliber down to the bladder. Preserved contours of the urinary bladder. Stomach/Bowel: Oral contrast was administered. The stomach is nondilated. There is increasing diameter of several loops of small bowel now approaching up to 4.4 cm. No abrupt transition. More distally several fluid-filled loops are also seen which are nondilated. Changes are most consistent with a ileus rather than developing obstruction. The oral contrast is not reached the large bowel. Large bowel is relatively decompressed with scattered colonic diverticula. Once again changes of stranding in the pelvis are seen. Extraluminal air and fluid anterior to the sigmoid colon again identified and small pockets. There is larger collection in the right lower quadrant which has significantly increased. This abuts the cecum and terminal ileum measuring 11.2 by 5.0 by 7.3 cm and could be a developing abscess. There is increasing free air throughout the abdomen. Increasing mesenteric stranding and ill-defined fluid. Vascular/Lymphatic: Aortic atherosclerosis. No enlarged abdominal or pelvic lymph nodes. Several prominent mesenteric nodes are seen which very well could be reactive. Reproductive: Prostate is unremarkable. Other: Mild anasarca. Musculoskeletal: Mild degenerative changes along the spine. Bridging osteophytes along the lower thoracic region. IMPRESSION: Worsening free air, stranding and fluid. Please correlate for worsening signs of perforation and any clinical evidence of peritonitis. Developing fluid and gas collection in the right hemipelvis measuring up to 11.2 x 5.0 x 7.3 cm. Possible abscess. Worsening small bowel dilatation. Again favor ileus over developing obstruction. Fatty liver infiltration. Increasing bandlike opacity at the lung bases and tiny effusions. Recommend follow up Critical  Value/emergent results were called by telephone at the time of interpretation on 09/29/2023 at 9:23 am PST to provider [facility] , who verbally acknowledged these results. Electronically Signed   By: Karen Kays M.D.   On: 09/29/2023 12:40   CT ABDOMEN PELVIS W CONTRAST  Result Date: 09/24/2023 CLINICAL DATA:  Acute generalized abdominal pain. EXAM: CT ABDOMEN AND PELVIS WITH CONTRAST TECHNIQUE: Multidetector CT imaging of the abdomen and pelvis was performed using the standard protocol following bolus administration of intravenous contrast. RADIATION DOSE REDUCTION: This exam was performed according to the departmental dose-optimization program which includes automated exposure control, adjustment of the mA and/or kV according to patient size and/or use of iterative reconstruction technique. CONTRAST:  OMNIPAQUE IOHEXOL 300 MG/ML  SOLN COMPARISON:  February 12, 2021. FINDINGS: Lower chest: Mild bibasilar subsegmental atelectasis is noted. Hepatobiliary: Hepatic steatosis. Status post cholecystectomy. No biliary dilatation. Pancreas: Unremarkable. No pancreatic ductal dilatation or surrounding inflammatory changes. Spleen: Normal in size without focal abnormality. Adrenals/Urinary Tract: Adrenal glands appear normal. Bilateral renal cysts are noted for which no further follow-up is required. No hydronephrosis or renal obstruction is noted. Urinary bladder is unremarkable. Stomach/Bowel: Status post appendectomy. Stomach is unremarkable. Mildly dilated small bowel loops are noted most likely representing ileus secondary to inflammation. Moderate sigmoid diverticulitis is noted with adjacent air in the peritoneal fat consistent with small perforation. Free air is also noted in the epigastric region. Wall thickening of small bowel loop is seen in the right lower quadrant most likely due to secondary inflammation. Vascular/Lymphatic: No significant vascular findings are present. No enlarged abdominal or  pelvic lymph nodes. Reproductive: Stable mild prostatic enlargement. Other: No abdominal wall hernia or abnormality. No abdominopelvic ascites. Musculoskeletal: No acute or significant osseous findings. IMPRESSION: Findings consistent with moderate diverticulitis  with adjacent free air consistent with perforation. Free air is also noted in the epigastric region. Wall thickening of adjacent small bowel loop is noted in the pelvis representing secondary inflammation. Mildly dilated small bowel loops are noted more superiorly most likely due to inflammatory ileus. Critical Value/emergent results were called by telephone at the time of interpretation on 09/24/2023 at 1:08 pm to provider AMJAD ALI , who verbally acknowledged these results. Hepatic steatosis. Stable mild prostatic enlargement. Mild bibasilar subsegmental atelectasis. Electronically Signed   By: Lupita Raider M.D.   On: 09/24/2023 13:08    Labs:  CBC: Recent Labs    09/28/23 0516 09/29/23 0548 09/30/23 0543 10/01/23 0550  WBC 8.4 12.0* 12.1* 11.2*  HGB 13.8 14.7 14.5 14.9  HCT 42.3 44.6 44.1 45.3  PLT 282 349 394 405*    COAGS: Recent Labs    09/29/23 1037  INR 1.1    BMP: Recent Labs    09/27/23 0444 09/28/23 0516 09/29/23 0548 09/30/23 0543  NA 136 134* 139 132*  K 4.2 4.0 4.2 4.0  CL 107 103 103 101  CO2 23 25 24  19*  GLUCOSE 97 110* 93 87  BUN 25* 20 21* 21*  CALCIUM 8.4* 8.4* 9.1 8.5*  CREATININE 1.04 0.94 1.03 1.01  GFRNONAA >60 >60 >60 >60    LIVER FUNCTION TESTS: Recent Labs    09/24/23 1122  BILITOT 1.9*  AST 22  ALT 38  ALKPHOS 44  PROT 7.6  ALBUMIN 4.3      Assessment and Plan:  CT of the abdomen and pelvis today demonstrates complete resolution of the RLQ abscess and no new fluid collections. There are some small loculations of air medial to the drain and residual inflammation in the adjacent pelvic mesenteric fat.  Drain injection under fluoroscopy demonstrates a decompressed  abscess cavity and no fistula to adjacent bowel. The drain was removed in its entirety today. He will continue to follow up with Dr. Carolynne Edouard on Monday.   Electronically Signed: Reola Calkins 10/13/2023, 2:18 PM    I spent a total of 10 Minutes in face to face in clinical consultation, greater than 50% of which was counseling/coordinating care for abscess drain management.

## 2023-10-18 ENCOUNTER — Ambulatory Visit: Payer: Self-pay | Admitting: Surgery

## 2023-10-18 DIAGNOSIS — R739 Hyperglycemia, unspecified: Secondary | ICD-10-CM

## 2023-11-15 ENCOUNTER — Emergency Department (HOSPITAL_BASED_OUTPATIENT_CLINIC_OR_DEPARTMENT_OTHER): Payer: 59

## 2023-11-15 ENCOUNTER — Inpatient Hospital Stay (HOSPITAL_BASED_OUTPATIENT_CLINIC_OR_DEPARTMENT_OTHER)
Admission: EM | Admit: 2023-11-15 | Discharge: 2023-11-19 | DRG: 394 | Disposition: A | Discharge status: Home / Self Care | Payer: 59 | Attending: Internal Medicine | Admitting: Internal Medicine

## 2023-11-15 ENCOUNTER — Other Ambulatory Visit: Payer: Self-pay

## 2023-11-15 ENCOUNTER — Encounter (HOSPITAL_BASED_OUTPATIENT_CLINIC_OR_DEPARTMENT_OTHER): Payer: Self-pay | Admitting: Emergency Medicine

## 2023-11-15 DIAGNOSIS — K5720 Diverticulitis of large intestine with perforation and abscess without bleeding: Secondary | ICD-10-CM | POA: Diagnosis present

## 2023-11-15 DIAGNOSIS — E871 Hypo-osmolality and hyponatremia: Secondary | ICD-10-CM | POA: Diagnosis present

## 2023-11-15 DIAGNOSIS — K668 Other specified disorders of peritoneum: Secondary | ICD-10-CM | POA: Diagnosis present

## 2023-11-15 DIAGNOSIS — R188 Other ascites: Secondary | ICD-10-CM | POA: Diagnosis present

## 2023-11-15 DIAGNOSIS — E861 Hypovolemia: Secondary | ICD-10-CM | POA: Diagnosis present

## 2023-11-15 DIAGNOSIS — K760 Fatty (change of) liver, not elsewhere classified: Secondary | ICD-10-CM | POA: Diagnosis present

## 2023-11-15 DIAGNOSIS — Z79899 Other long term (current) drug therapy: Secondary | ICD-10-CM | POA: Diagnosis not present

## 2023-11-15 DIAGNOSIS — N400 Enlarged prostate without lower urinary tract symptoms: Secondary | ICD-10-CM | POA: Diagnosis present

## 2023-11-15 DIAGNOSIS — Z1152 Encounter for screening for COVID-19: Secondary | ICD-10-CM | POA: Diagnosis not present

## 2023-11-15 DIAGNOSIS — I10 Essential (primary) hypertension: Secondary | ICD-10-CM | POA: Diagnosis present

## 2023-11-15 DIAGNOSIS — K219 Gastro-esophageal reflux disease without esophagitis: Secondary | ICD-10-CM | POA: Diagnosis present

## 2023-11-15 LAB — CBC
HCT: 42.8 % (ref 39.0–52.0)
Hemoglobin: 14.5 g/dL (ref 13.0–17.0)
MCH: 29.4 pg (ref 26.0–34.0)
MCHC: 33.9 g/dL (ref 30.0–36.0)
MCV: 86.8 fL (ref 80.0–100.0)
Platelets: 293 10*3/uL (ref 150–400)
RBC: 4.93 MIL/uL (ref 4.22–5.81)
RDW: 13.6 % (ref 11.5–15.5)
WBC: 16.7 10*3/uL — ABNORMAL HIGH (ref 4.0–10.5)
nRBC: 0 % (ref 0.0–0.2)

## 2023-11-15 LAB — COMPREHENSIVE METABOLIC PANEL
ALT: 26 U/L (ref 0–44)
AST: 15 U/L (ref 15–41)
Albumin: 4.7 g/dL (ref 3.5–5.0)
Alkaline Phosphatase: 50 U/L (ref 38–126)
Anion gap: 10 (ref 5–15)
BUN: 16 mg/dL (ref 6–20)
CO2: 24 mmol/L (ref 22–32)
Calcium: 9.6 mg/dL (ref 8.9–10.3)
Chloride: 98 mmol/L (ref 98–111)
Creatinine, Ser: 1.14 mg/dL (ref 0.61–1.24)
GFR, Estimated: >60 mL/min (ref >60)
Glucose, Bld: 112 mg/dL — ABNORMAL HIGH (ref 70–99)
Potassium: 4.3 mmol/L (ref 3.5–5.1)
Sodium: 132 mmol/L — ABNORMAL LOW (ref 135–145)
Total Bilirubin: 1.4 mg/dL — ABNORMAL HIGH (ref 0.0–1.2)
Total Protein: 7.6 g/dL (ref 6.5–8.1)

## 2023-11-15 LAB — URINALYSIS, ROUTINE W REFLEX MICROSCOPIC
Bacteria, UA: NONE SEEN
Bilirubin Urine: NEGATIVE
Glucose, UA: NEGATIVE mg/dL
Ketones, ur: NEGATIVE mg/dL
Leukocytes,Ua: NEGATIVE
Nitrite: NEGATIVE
Specific Gravity, Urine: 1.043 — ABNORMAL HIGH (ref 1.005–1.030)
pH: 6.5 (ref 5.0–8.0)

## 2023-11-15 LAB — APTT: aPTT: 31 s (ref 24–36)

## 2023-11-15 LAB — LACTIC ACID, PLASMA
Lactic Acid, Venous: 1.5 mmol/L (ref 0.5–1.9)
Lactic Acid, Venous: 1.2 mmol/L (ref 0.5–1.9)

## 2023-11-15 LAB — RESP PANEL BY RT-PCR (RSV, FLU A&B, COVID)  RVPGX2
Influenza A by PCR: NEGATIVE
Influenza B by PCR: NEGATIVE
Resp Syncytial Virus by PCR: NEGATIVE
SARS Coronavirus 2 by RT PCR: NEGATIVE

## 2023-11-15 LAB — PROTIME-INR
INR: 1.1 (ref 0.8–1.2)
Prothrombin Time: 13.9 s (ref 11.4–15.2)

## 2023-11-15 LAB — LIPASE, BLOOD: Lipase: 20 U/L (ref 11–51)

## 2023-11-15 MED ORDER — ACETAMINOPHEN 325 MG PO TABS
650.0000 mg | ORAL_TABLET | Freq: Four times a day (QID) | ORAL | Status: DC | PRN
  Administered 2023-11-17 – 2023-11-18 (×4): 650 mg via ORAL
  Filled 2023-11-15 (×4): qty 2

## 2023-11-15 MED ORDER — ONDANSETRON HCL 4 MG/2ML IJ SOLN: 4.0000 mg | Freq: Four times a day (QID) | INTRAMUSCULAR | Status: DC | PRN

## 2023-11-15 MED ORDER — METRONIDAZOLE 500 MG/100ML IV SOLN
500.0000 mg | Freq: Two times a day (BID) | INTRAVENOUS | Status: DC
  Administered 2023-11-16 – 2023-11-19 (×7): 500 mg via INTRAVENOUS
  Filled 2023-11-15 (×7): qty 100

## 2023-11-15 MED ORDER — TAMSULOSIN HCL 0.4 MG PO CAPS: 0.4000 mg | ORAL_CAPSULE | Freq: Every evening | ORAL | Status: DC

## 2023-11-15 MED ORDER — LACTATED RINGERS IV BOLUS (SEPSIS)
1000.0000 mL | Freq: Once | INTRAVENOUS | Status: AC
  Administered 2023-11-15: 1000 mL via INTRAVENOUS

## 2023-11-15 MED ORDER — PANTOPRAZOLE SODIUM 40 MG IV SOLR
40.0000 mg | Freq: Every day | INTRAVENOUS | Status: DC
  Administered 2023-11-16 – 2023-11-19 (×4): 40 mg via INTRAVENOUS
  Filled 2023-11-15 (×4): qty 10

## 2023-11-15 MED ORDER — LACTATED RINGERS IV SOLN: INTRAVENOUS | Status: AC

## 2023-11-15 MED ORDER — CEFEPIME HCL 2 G IV SOLR
2.0000 g | Freq: Three times a day (TID) | INTRAVENOUS | Status: DC
  Administered 2023-11-16 – 2023-11-19 (×11): 2 g via INTRAVENOUS
  Filled 2023-11-15 (×11): qty 12.5

## 2023-11-15 MED ORDER — SODIUM CHLORIDE 0.9 % IV SOLN
2.0000 g | Freq: Once | INTRAVENOUS | Status: AC
  Administered 2023-11-15: 2 g via INTRAVENOUS
  Filled 2023-11-15: qty 20

## 2023-11-15 MED ORDER — ACETAMINOPHEN 650 MG RE SUPP: 650.0000 mg | Freq: Four times a day (QID) | RECTAL | Status: DC | PRN

## 2023-11-15 MED ORDER — ONDANSETRON HCL 4 MG PO TABS
4.0000 mg | ORAL_TABLET | Freq: Four times a day (QID) | ORAL | Status: DC | PRN
  Administered 2023-11-17: 4 mg via ORAL
  Filled 2023-11-15: qty 1

## 2023-11-15 MED ORDER — IOHEXOL 300 MG/ML  SOLN
100.0000 mL | Freq: Once | INTRAMUSCULAR | Status: AC | PRN
  Administered 2023-11-15: 100 mL via INTRAVENOUS

## 2023-11-15 MED ORDER — HYDROMORPHONE HCL 1 MG/ML IJ SOLN
0.5000 mg | Freq: Once | INTRAMUSCULAR | Status: AC
  Administered 2023-11-15: 0.5 mg via INTRAVENOUS
  Filled 2023-11-15: qty 1

## 2023-11-15 MED ORDER — HYDROMORPHONE HCL 1 MG/ML IJ SOLN
0.5000 mg | INTRAMUSCULAR | Status: DC | PRN
  Administered 2023-11-16 – 2023-11-17 (×8): 1 mg via INTRAVENOUS
  Filled 2023-11-15 (×8): qty 1

## 2023-11-15 MED ORDER — METRONIDAZOLE 500 MG/100ML IV SOLN
500.0000 mg | Freq: Once | INTRAVENOUS | Status: AC
  Administered 2023-11-15: 500 mg via INTRAVENOUS
  Filled 2023-11-15 (×2): qty 100

## 2023-11-15 MED ORDER — PANTOPRAZOLE SODIUM 20 MG PO TBEC: 20.0000 mg | DELAYED_RELEASE_TABLET | Freq: Every evening | ORAL | Status: DC

## 2023-11-15 NOTE — Sepsis Progress Note (Signed)
Notified bedside nurse of need to administer antibiotics and fluid bolus.  

## 2023-11-15 NOTE — ED Provider Notes (Signed)
 [facility] Provider Note   CSN: [id number] Arrival date & time: 11/15/23  1527     History  Chief Complaint  Patient presents with   Abdominal Pain    BARI HANDSHOE is a 58 y.o. male with PMH as listed below who presents with allover abdominal pain that began last night at 8 PM, gradual onset.  Associated with nausea no vomiting.  Associated with chills no fever.  Denies any urinary symptoms.  Endorses some right testicular pain last night but is not hurting anymore.  Denies any abdominal or testicular trauma.  Last bowel movement was yesterday morning which was normal for him.  Had recent history of diverticulitis with perforation in November 2024 that was treated with a PERC drain and antibiotics.  That pain was very severe in his lower quadrants but this pain is less severe and seems to be all over his abdomen, more in the upper part.  He had been given antibiotics to take as needed by his gastroenterologist in case he had a diverticulitis flare again, so took 2 doses of ciprofloxacin  PO, last night and this morning.    Past Medical History:  Diagnosis Date   Fatty liver    GERD (gastroesophageal reflux disease)        Home Medications Prior to Admission medications   Medication Sig Start Date End Date Taking? Authorizing Provider  acetaminophen  (TYLENOL ) 500 MG tablet Take 1,000 mg by mouth every 6 (six) hours as needed for mild pain (pain score 1-3) or moderate pain (pain score 4-6).    [provider]  amoxicillin -clavulanate (AUGMENTIN ) 875-125 MG tablet Take 1 tablet by mouth 2 (two) times daily. 10/01/23   Danford, Lonni SQUIBB, MD  pantoprazole  (PROTONIX ) 20 MG tablet Take 20 mg by mouth every evening. 04/02/21   [provider]  Sodium Chloride  Flush (NORMAL SALINE FLUSH) 0.9 % SOLN Flush abdominal drain with 5cc once daily. 10/01/23   Allred, Darrell K, PA-C  tamsulosin  (FLOMAX ) 0.4 MG CAPS capsule Take 0.4  mg by mouth every evening. 03/24/21   [provider]      Allergies    Patient has no known allergies.    Review of Systems   Review of Systems A 10 point review of systems was performed and is negative unless otherwise reported in HPI.  Physical Exam Updated Vital Signs BP (!) 123/94 (BP Location: Right Arm)   Pulse (!) 113   Temp 98.5 F (36.9 C) (Oral)   Resp 18   SpO2 96%  Physical Exam General: Normal appearing male, lying in bed.  HEENT: PERRLA, Sclera anicteric, MMM, trachea midline.  Cardiology: Regular tachycardic rate., no murmurs/rubs/gallops.  Resp: Normal respiratory rate and effort. CTAB, no wheezes, rhonchi, crackles.  Abd: Mildly distended abdomen with diffuse tenderness throughout.  Mild involuntary guarding throughout.  GU: Performed with nurse chaperone.  Normal-appearing external genitalia, circumcised.  No inguinal hernia palpable.  No testicular masses bilaterally or any pain to palpation of bilateral testicles.  Normal testicular lie.  No skin changes to the scrotum bilaterally. MSK: No peripheral edema or signs of trauma. Extremities without deformity or TTP. No cyanosis or clubbing. Skin: warm, dry. Back: No CVA tenderness Neuro: A&Ox4, CNs II-XII grossly intact. MAEs. Sensation grossly intact.  Psych: Normal mood and affect.   ED Results / Procedures / Treatments   Labs (all labs ordered are listed, but only abnormal results are displayed) Labs Reviewed  COMPREHENSIVE METABOLIC PANEL - Abnormal;  Notable for the following components:      Result Value   Sodium 132 (*)    Glucose, Bld 112 (*)    Total Bilirubin 1.4 (*)    All other components within normal limits  CBC - Abnormal; Notable for the following components:   WBC 16.7 (*)    All other components within normal limits  URINALYSIS, ROUTINE W REFLEX MICROSCOPIC - Abnormal; Notable for the following components:   Specific Gravity, Urine 1.043 (*)    Hgb urine dipstick TRACE (*)     Protein, ur TRACE (*)    All other components within normal limits  RESP PANEL BY RT-PCR (RSV, FLU A&B, COVID)  RVPGX2  CULTURE, BLOOD (ROUTINE X 2)  CULTURE, BLOOD (ROUTINE X 2)  LIPASE, BLOOD  LACTIC ACID, PLASMA  LACTIC ACID, PLASMA  PROTIME-INR  APTT  BASIC METABOLIC PANEL  HEPATIC FUNCTION PANEL  CBC    EKG None  Radiology CT ABDOMEN PELVIS W CONTRAST Result Date: 11/15/2023 CLINICAL DATA:  Abdominal pain. EXAM: CT ABDOMEN AND PELVIS WITH CONTRAST TECHNIQUE: Multidetector CT imaging of the abdomen and pelvis was performed using the standard protocol following bolus administration of intravenous contrast. RADIATION DOSE REDUCTION: This exam was performed according to the departmental dose-optimization program which includes automated exposure control, adjustment of the mA and/or kV according to patient size and/or use of iterative reconstruction technique. CONTRAST:  OMNIPAQUE  IOHEXOL  300 MG/ML  SOLN COMPARISON:  October 13, 2023 FINDINGS: Lower chest: No acute abnormality. Hepatobiliary: No focal liver abnormality is seen. Status post cholecystectomy. No biliary dilatation. Pancreas: Unremarkable. No pancreatic ductal dilatation or surrounding inflammatory changes. Spleen: Normal in size without focal abnormality. Adrenals/Urinary Tract: Adrenal glands are unremarkable. Kidneys are normal in size, without renal calculi or hydronephrosis. Multiple stable bilateral simple renal cysts are seen. Bladder is unremarkable. Stomach/Bowel: Stomach is within normal limits. The appendix is surgically absent. A short segment dilated small bowel is seen within the anterior aspect of the mid left abdomen (maximum small bowel diameter of approximately 3.1 cm). A transition zone is seen within the region anterior to the left kidney (axial CT images 34 through 39, CT series 2). Noninflamed diverticula are seen throughout the sigmoid colon. The right lower quadrant percutaneous drainage catheter seen  on the prior study has been removed. A residual 2.2 cm x 2.7 cm x 2.3 cm extraluminal collection of fluid and air is seen within this region (axial CT image 60, CT series 2/coronal reformatted image 56, CT series 6). A mild to moderate amount of free air is seen within the mid to upper abdomen. Mild mesenteric inflammatory fat stranding is also seen within the lower abdomen and pelvis, right greater than left. Mildly inflamed small bowel loops are also noted within this region. Vascular/Lymphatic: Aortic atherosclerosis. No enlarged abdominal or pelvic lymph nodes. Reproductive: There is mild to moderate severity prostate gland enlargement. Other: No abdominal wall hernia or abnormality. No abdominopelvic ascites. Musculoskeletal: No acute or significant osseous findings. IMPRESSION: 1. Short segment of dilated proximal small bowel which may be transient in nature. Sequelae associated with an early partial small bowel obstruction cannot be excluded. 2. Mild to moderate amount of free air within the mid to upper abdomen which represents a new finding when compared to the prior study and likely represents sequelae associated with perforation associated with the inflammatory process seen within the right lower quadrant. 3. Interval removal of the right lower quadrant percutaneous drainage catheter with a residual 2.2 cm x  2.7 cm x 2.3 cm extraluminal collection of fluid and air within this region. 4. Mild enteritis involving loops of mid and distal ileum. 5. Sigmoid diverticulosis. 6. Multiple stable bilateral simple renal cysts. No follow-up imaging is recommended. This recommendation follows ACR consensus guidelines: Management of the Incidental Renal Mass on CT: A White Paper of the ACR Incidental Findings Committee. J Am Coll Radiol [phone]. 7. Aortic atherosclerosis. Aortic Atherosclerosis (ICD10-I70.0). Electronically Signed   By: Suzen Dials M.D.   On: 11/15/2023 19:40    Procedures Procedures     Medications Ordered in ED Medications  acetaminophen  (TYLENOL ) tablet 650 mg (has no administration in time range)    Or  acetaminophen  (TYLENOL ) suppository 650 mg (has no administration in time range)  ondansetron  (ZOFRAN ) tablet 4 mg (has no administration in time range)    Or  ondansetron  (ZOFRAN ) injection 4 mg (has no administration in time range)  HYDROmorphone  (DILAUDID ) injection 0.5-1 mg (has no administration in time range)  metroNIDAZOLE  (FLAGYL ) IVPB 500 mg (has no administration in time range)  lactated ringers  infusion (has no administration in time range)  pantoprazole  (PROTONIX ) injection 40 mg (has no administration in time range)  lactated ringers  bolus 1,000 mL (0 mLs Intravenous Stopped 11/15/23 2015)  cefTRIAXone  (ROCEPHIN ) 2 g in sodium chloride  0.9 % 100 mL IVPB (0 g Intravenous Stopped 11/15/23 2016)  metroNIDAZOLE  (FLAGYL ) IVPB 500 mg (0 mg Intravenous Stopped 11/15/23 2016)  iohexol  (OMNIPAQUE ) 300 MG/ML solution 100 mL (100 mLs Intravenous Contrast Given 11/15/23 1755)  HYDROmorphone  (DILAUDID ) injection 0.5 mg (0.5 mg Intravenous Given 11/15/23 2015)  HYDROmorphone  (DILAUDID ) injection 0.5 mg (0.5 mg Intravenous Given 11/15/23 2232)    ED Course/ Medical Decision Making/ A&P                          Medical Decision Making Amount and/or Complexity of Data Reviewed Labs: ordered. Decision-making details documented in ED Course. Radiology: ordered.  Risk Prescription drug management. Decision regarding hospitalization.    This patient presents to the ED for concern of abd pain, tachycardia, this involves an extensive number of treatment options, and is a complaint that carries with it a high risk of complications and morbidity.  I considered the following differential and admission for this acute, potentially life threatening condition.   MDM:    For DDX for abdominal pain includes but is not limited to:  Abdominal exam with concerning involuntary  guarding. Greatest concern for possible diverticulitis with perforation, however pain/tenderness seems to be diffuse. Also consider acute hepatobiliary disease (including acute cholecystitis or cholangitis), acute pancreatitis (though neg lipase), PUD (including gastric perforation), acute appendicitis, vascular catastrophe, bowel obstruction, viscus perforation.  No testicular abnormalities on exam to indicate testicular torsion or epididymitis.  Patient is tachycardic On presentation into the 120s and has not been able to eat anything for the last day due to nausea, consider hypovolemia though he does have an elevated white count as well which makes him SIRS positive and with concern for intra-abdominal infection, consider sepsis.  Will obtain blood cultures and give broad-spectrum antibiotics.   Clinical Course as of 11/15/23 2341  Mon Nov 15, 2023  1708 WBC(!): 16.7 +leukocytosis [HN]  1740 Lactic Acid, Venous: 1.5 wnl [HN]  1740 Lipase: 20 wnl [HN]  1932 D/w radiologist over the phone who states that since perc drain has been removed, still has residual free air and fluid collection, not walled off, likely from original insult of perf  diverticulitis in November. Possible ileus, possibly transient.   Given abx, will consult with surgery. [HN]  1945 Urinalysis, Routine w reflex microscopic -Urine, Clean Catch(!) Unremarkable in the context of this patient's presentation  [HN]  1945 D/w Dr. Lyndel who recommends admission to hospitalist as he had before and they will talk with IR about drain, may end up with surgery this admission but not emergently. Consulted to hospitalist. [HN]  2019 Discussed with Dr. Shona  [HN]    Clinical Course User Index [HN] Franklyn Sid SAILOR, MD    Labs: I Ordered, and personally interpreted labs.  The pertinent results include: Those listed above  Imaging Studies ordered: I ordered imaging studies including CT abd pelvis I independently visualized and  interpreted imaging. I agree with the radiologist interpretation  Additional history obtained from chart review, wife at bedside.   Cardiac Monitoring: The patient was maintained on a cardiac monitor.  I personally viewed and interpreted the cardiac monitored which showed an underlying rhythm of: Sinus tachycardia  Reevaluation: After the interventions noted above, I reevaluated the patient and found that they have :improved  Social Determinants of Health:  lives independently  Disposition: Admitted to medicine with surgery following  Co morbidities that complicate the patient evaluation  Past Medical History:  Diagnosis Date   Fatty liver    GERD (gastroesophageal reflux disease)      Medicines Meds ordered this encounter  Medications   lactated ringers  bolus 1,000 mL    Reason 30 mL/kg dose is not being ordered:   First Lactic Acid Pending   cefTRIAXone  (ROCEPHIN ) 2 g in sodium chloride  0.9 % 100 mL IVPB    Antibiotic Indication::   Intra-abdominal   metroNIDAZOLE  (FLAGYL ) IVPB 500 mg    Antibiotic Indication::   Intra-abdominal Infection   iohexol  (OMNIPAQUE ) 300 MG/ML solution 100 mL   HYDROmorphone  (DILAUDID ) injection 0.5 mg   HYDROmorphone  (DILAUDID ) injection 0.5 mg   DISCONTD: tamsulosin  (FLOMAX ) capsule 0.4 mg   DISCONTD: pantoprazole  (PROTONIX ) EC tablet 20 mg   OR Linked Order Group    acetaminophen  (TYLENOL ) tablet 650 mg    acetaminophen  (TYLENOL ) suppository 650 mg   OR Linked Order Group    ondansetron  (ZOFRAN ) tablet 4 mg    ondansetron  (ZOFRAN ) injection 4 mg   HYDROmorphone  (DILAUDID ) injection 0.5-1 mg   metroNIDAZOLE  (FLAGYL ) IVPB 500 mg    Antibiotic Indication::   Intra-abdominal Infection   lactated ringers  infusion   pantoprazole  (PROTONIX ) injection 40 mg    I have reviewed the patients home medicines and have made adjustments as needed  Problem List / ED Course: Problem List Items Addressed This Visit       Other   * (Principal)  Intraabdominal fluid collection - Primary   Other Visit Diagnoses       Pneumoperitoneum                       This note was created using dictation software, which may contain spelling or grammatical errors.    Franklyn Sid SAILOR, MD 11/15/23 [id number]

## 2023-11-15 NOTE — ED Notes (Signed)
 Pt. To CT

## 2023-11-15 NOTE — ED Triage Notes (Signed)
 Abd pain, started last night. Hx of diverticulitis, perf in November Some nausea

## 2023-11-15 NOTE — ED Notes (Signed)
Back to room from CT  

## 2023-11-15 NOTE — Progress Notes (Signed)
 Pharmacy Antibiotic Note  [facility] is a 58 y.o. male admitted on 11/15/2023 with intraabdominal infection.  Pharmacy has been consulted for Cefepime  dosing.  Plan: Cefepime  2g IV q8h    Temp (24hrs), Avg:98.3 F (36.8 C), Min:97.9 F (36.6 C), Max:98.5 F (36.9 C)  Recent Labs  Lab 11/15/23 1554 11/15/23 1754  WBC 16.7*  --   CREATININE 1.14  --   LATICACIDVEN 1.5 1.2    CrCl cannot be calculated (Unknown ideal weight.).    No Known Allergies   [facility] 11/15/2023 11:39 PM

## 2023-11-15 NOTE — Sepsis Progress Note (Signed)
 Elink monitoring for the code sepsis protocol.

## 2023-11-15 NOTE — H&P (Addendum)
 History and Physical    Henry Paul FMW:[id number] DOB: May 05, 1966 DOA: 11/15/2023  PCP: Ransom Other, MD   Patient coming from: home  Chief Complaint: Abdominal pain   HPI: Henry Paul is a 58 y.o. male with medical history significant for BPH, GERD, fatty liver, and perforated sigmoid diverticulitis in November 2024 who now presents with abdominal pain and nausea.  Patient had been admitted to the hospital on 09/24/2023 with perforated sigmoid diverticulitis, failed to improve on Zosyn , repeat CT revealed RLQ fluid collection, and percutaneous drain was placed.  He he then had good clinical improvement, completed a course of Augmentin  after discharge, and had the drain removed on 10/13/2023 after CT demonstrated complete resolution of the abscess.   He continued to do well at home until last night when he developed acute onset of sweats and chills, followed by generalized abdominal pain.  He initially felt somewhat better this morning and went to work, but pain began to worsen again, prompting his presentation to the ED.  He has nausea associated with this but no vomiting.  He had a bowel movement at around noon today and continues to pass flatus.  [facility] ED Course: Upon arrival to the ED, patient is found to be afebrile and saturating well on room air with elevated heart rate and stable blood pressure.  Labs were most notable for sodium 132, WBC 16,700, normal lactic acid x 2, and negative respiratory virus panel.  Surgery and IR were consulted by the ED physician, blood cultures were collected, patient was treated with Rocephin , Flagyl , Dilaudid , and a liter of LR, and he was transferred to [facility] for admission.  Review of Systems:  All other systems reviewed and apart from HPI, are negative.  Past Medical History:  Diagnosis Date   Fatty liver    GERD (gastroesophageal reflux disease)     Past Surgical History:  Procedure Laterality Date    APPENDECTOMY     CHOLECYSTECTOMY N/A 04/17/2021   Procedure: LAPAROSCOPIC CHOLECYSTECTOMY;  Surgeon: Vernetta Berg, MD;  Location: [facility] ORS;  Service: General;  Laterality: N/A;   COLONOSCOPY     FINGER SURGERY Left    ring and middle fingers reatttachment after amputation   IR RADIOLOGIST EVAL & MGMT  10/13/2023   WISDOM TOOTH EXTRACTION      Social History:   reports that he has never smoked. He has never used smokeless tobacco. He reports that he does not drink alcohol and does not use drugs.  No Known Allergies  History reviewed. No pertinent family history.   Prior to Admission medications   Medication Sig Start Date End Date Taking? Authorizing Provider  acetaminophen  (TYLENOL ) 500 MG tablet Take 1,000 mg by mouth every 6 (six) hours as needed for mild pain (pain score 1-3) or moderate pain (pain score 4-6).    [provider]  amoxicillin -clavulanate (AUGMENTIN ) 875-125 MG tablet Take 1 tablet by mouth 2 (two) times daily. 10/01/23   Danford, Lonni SQUIBB, MD  pantoprazole  (PROTONIX ) 20 MG tablet Take 20 mg by mouth every evening. 04/02/21   [provider]  Sodium Chloride  Flush (NORMAL SALINE FLUSH) 0.9 % SOLN Flush abdominal drain with 5cc once daily. 10/01/23   Allred, Darrell K, PA-C  tamsulosin  (FLOMAX ) 0.4 MG CAPS capsule Take 0.4 mg by mouth every evening. 03/24/21   [provider]    Physical Exam: Vitals:   11/15/23 2130 11/15/23 2200 11/15/23 2230 11/15/23 2314  BP: (!) 150/93 (!) 139/90 ROLLEN)  139/103 (!) 123/94  Pulse: (!) 103 (!) 113 (!) 110 (!) 113  Resp: (!) 24 18 (!) 21 18  Temp:    98.5 F (36.9 C)  TempSrc:    Oral  SpO2: 96% 97% 97% 96%    Constitutional: NAD, calm  Eyes: PERTLA, lids and conjunctivae normal ENMT: Mucous membranes are moist. Posterior pharynx clear of any exudate or lesions.   Neck: supple, no masses  Respiratory: no wheezing, no crackles. No accessory muscle use.  Cardiovascular: S1 & S2 heard, regular  rate and rhythm. No extremity edema.   Abdomen: Soft, generally tender. Bowel sounds active.  Musculoskeletal: no clubbing / cyanosis. No joint deformity upper and lower extremities.   Skin: no significant rashes, lesions, ulcers. Warm, dry, well-perfused. Neurologic: CN 2-12 grossly intact. Moving all extremities. Alert and oriented.  Psychiatric: Pleasant. Cooperative.    Labs and Imaging on Admission: I have personally reviewed following labs and imaging studies  CBC: Recent Labs  Lab 11/15/23 1554  WBC 16.7*  HGB 14.5  HCT 42.8  MCV 86.8  PLT 293   Basic Metabolic Panel: Recent Labs  Lab 11/15/23 1554  NA 132*  K 4.3  CL 98  CO2 24  GLUCOSE 112*  BUN 16  CREATININE 1.14  CALCIUM 9.6   GFR: CrCl cannot be calculated (Unknown ideal weight.). Liver Function Tests: Recent Labs  Lab 11/15/23 1554  AST 15  ALT 26  ALKPHOS 50  BILITOT 1.4*  PROT 7.6  ALBUMIN 4.7   Recent Labs  Lab 11/15/23 1554  LIPASE 20   No results for input(s): AMMONIA in the last 168 hours. Coagulation Profile: Recent Labs  Lab 11/15/23 1709  INR 1.1   Cardiac Enzymes: No results for input(s): CKTOTAL, CKMB, CKMBINDEX, TROPONINI in the last 168 hours. BNP (last 3 results) No results for input(s): PROBNP in the last 8760 hours. HbA1C: No results for input(s): HGBA1C in the last 72 hours. CBG: No results for input(s): GLUCAP in the last 168 hours. Lipid Profile: No results for input(s): CHOL, HDL, LDLCALC, TRIG, CHOLHDL, LDLDIRECT in the last 72 hours. Thyroid  Function Tests: No results for input(s): TSH, T4TOTAL, FREET4, T3FREE, THYROIDAB in the last 72 hours. Anemia Panel: No results for input(s): VITAMINB12, FOLATE, FERRITIN, TIBC, IRON, RETICCTPCT in the last 72 hours. Urine analysis:    Component Value Date/Time   COLORURINE YELLOW 11/15/2023 1554   APPEARANCEUR CLEAR 11/15/2023 1554   LABSPEC 1.043 (H) 11/15/2023 1554    PHURINE 6.5 11/15/2023 1554   GLUCOSEU NEGATIVE 11/15/2023 1554   HGBUR TRACE (A) 11/15/2023 1554   BILIRUBINUR NEGATIVE 11/15/2023 1554   KETONESUR NEGATIVE 11/15/2023 1554   PROTEINUR TRACE (A) 11/15/2023 1554   NITRITE NEGATIVE 11/15/2023 1554   LEUKOCYTESUR NEGATIVE 11/15/2023 1554   Sepsis Labs: @LABRCNTIP (procalcitonin:4,lacticidven:4) ) Recent Results (from the past 240 hours)  Resp panel by RT-PCR (RSV, Flu A&B, Covid)     Status: None   Collection Time: 11/15/23  5:09 PM   Specimen: Nasal Swab  Result Value Ref Range Status   SARS Coronavirus 2 by RT PCR NEGATIVE NEGATIVE Final    Comment: (NOTE) SARS-CoV-2 target nucleic acids are NOT DETECTED.  The SARS-CoV-2 RNA is generally detectable in upper respiratory specimens during the acute phase of infection. The lowest concentration of SARS-CoV-2 viral copies this assay can detect is 138 copies/mL. A negative result does not preclude SARS-Cov-2 infection and should not be used as the sole basis for treatment or other patient management decisions. A  negative result may occur with  improper specimen collection/handling, submission of specimen other than nasopharyngeal swab, presence of viral mutation(s) within the areas targeted by this assay, and inadequate number of viral copies(<138 copies/mL). A negative result must be combined with clinical observations, patient history, and epidemiological information. The expected result is Negative.  Fact Sheet for Patients:  bloggercourse.com  Fact Sheet for Healthcare Providers:  seriousbroker.it  This test is no t yet approved or cleared by the [place]  FDA and  has been authorized for detection and/or diagnosis of SARS-CoV-2 by FDA under an Emergency Use Authorization (EUA). This EUA will remain  in effect (meaning this test can be used) for the duration of the COVID-19 declaration under Section 564(b)(1) of the Act,  21 U.S.C.section 360bbb-3(b)(1), unless the authorization is terminated  or revoked sooner.       Influenza A by PCR NEGATIVE NEGATIVE Final   Influenza B by PCR NEGATIVE NEGATIVE Final    Comment: (NOTE) The Xpert Xpress SARS-CoV-2/FLU/RSV plus assay is intended as an aid in the diagnosis of influenza from Nasopharyngeal swab specimens and should not be used as a sole basis for treatment. Nasal washings and aspirates are unacceptable for Xpert Xpress SARS-CoV-2/FLU/RSV testing.  Fact Sheet for Patients: bloggercourse.com  Fact Sheet for Healthcare Providers: seriousbroker.it  This test is not yet approved or cleared by the [place]  FDA and has been authorized for detection and/or diagnosis of SARS-CoV-2 by FDA under an Emergency Use Authorization (EUA). This EUA will remain in effect (meaning this test can be used) for the duration of the COVID-19 declaration under Section 564(b)(1) of the Act, 21 U.S.C. section 360bbb-3(b)(1), unless the authorization is terminated or revoked.     Resp Syncytial Virus by PCR NEGATIVE NEGATIVE Final    Comment: (NOTE) Fact Sheet for Patients: bloggercourse.com  Fact Sheet for Healthcare Providers: seriousbroker.it  This test is not yet approved or cleared by the [place]  FDA and has been authorized for detection and/or diagnosis of SARS-CoV-2 by FDA under an Emergency Use Authorization (EUA). This EUA will remain in effect (meaning this test can be used) for the duration of the COVID-19 declaration under Section 564(b)(1) of the Act, 21 U.S.C. section 360bbb-3(b)(1), unless the authorization is terminated or revoked.  Performed at [facility], [address], [place], [place] [address]      Radiological Exams on Admission: CT ABDOMEN PELVIS W CONTRAST Result Date: 11/15/2023 CLINICAL DATA:  Abdominal  pain. EXAM: CT ABDOMEN AND PELVIS WITH CONTRAST TECHNIQUE: Multidetector CT imaging of the abdomen and pelvis was performed using the standard protocol following bolus administration of intravenous contrast. RADIATION DOSE REDUCTION: This exam was performed according to the departmental dose-optimization program which includes automated exposure control, adjustment of the mA and/or kV according to patient size and/or use of iterative reconstruction technique. CONTRAST:  OMNIPAQUE  IOHEXOL  300 MG/ML  SOLN COMPARISON:  October 13, 2023 FINDINGS: Lower chest: No acute abnormality. Hepatobiliary: No focal liver abnormality is seen. Status post cholecystectomy. No biliary dilatation. Pancreas: Unremarkable. No pancreatic ductal dilatation or surrounding inflammatory changes. Spleen: Normal in size without focal abnormality. Adrenals/Urinary Tract: Adrenal glands are unremarkable. Kidneys are normal in size, without renal calculi or hydronephrosis. Multiple stable bilateral simple renal cysts are seen. Bladder is unremarkable. Stomach/Bowel: Stomach is within normal limits. The appendix is surgically absent. A short segment dilated small bowel is seen within the anterior aspect of the mid left abdomen (maximum small bowel diameter of approximately 3.1 cm). A  transition zone is seen within the region anterior to the left kidney (axial CT images 34 through 39, CT series 2). Noninflamed diverticula are seen throughout the sigmoid colon. The right lower quadrant percutaneous drainage catheter seen on the prior study has been removed. A residual 2.2 cm x 2.7 cm x 2.3 cm extraluminal collection of fluid and air is seen within this region (axial CT image 60, CT series 2/coronal reformatted image 56, CT series 6). A mild to moderate amount of free air is seen within the mid to upper abdomen. Mild mesenteric inflammatory fat stranding is also seen within the lower abdomen and pelvis, right greater than left. Mildly inflamed  small bowel loops are also noted within this region. Vascular/Lymphatic: Aortic atherosclerosis. No enlarged abdominal or pelvic lymph nodes. Reproductive: There is mild to moderate severity prostate gland enlargement. Other: No abdominal wall hernia or abnormality. No abdominopelvic ascites. Musculoskeletal: No acute or significant osseous findings. IMPRESSION: 1. Short segment of dilated proximal small bowel which may be transient in nature. Sequelae associated with an early partial small bowel obstruction cannot be excluded. 2. Mild to moderate amount of free air within the mid to upper abdomen which represents a new finding when compared to the prior study and likely represents sequelae associated with perforation associated with the inflammatory process seen within the right lower quadrant. 3. Interval removal of the right lower quadrant percutaneous drainage catheter with a residual 2.2 cm x 2.7 cm x 2.3 cm extraluminal collection of fluid and air within this region. 4. Mild enteritis involving loops of mid and distal ileum. 5. Sigmoid diverticulosis. 6. Multiple stable bilateral simple renal cysts. No follow-up imaging is recommended. This recommendation follows ACR consensus guidelines: Management of the Incidental Renal Mass on CT: A White Paper of the ACR Incidental Findings Committee. J Am Coll Radiol [phone]. 7. Aortic atherosclerosis. Aortic Atherosclerosis (ICD10-I70.0). Electronically Signed   By: Suzen Dials M.D.   On: 11/15/2023 19:40    Assessment/Plan   1. Intraabdominal fluid-collection; pneumoperitoneum  - Continue empiric antibiotics, keep NPO, and hold pharmacologic VTE ppx pending surgery consultation     DVT prophylaxis: SCDs  Code Status: Full  Level of Care: Level of care: Med-Surg Family Communication: None present   Disposition Plan:  Patient is from: Home  Anticipated d/c is to: Home  Anticipated d/c date is: 11/18/23  Patient currently: Pending surgical  and IR recommendations  Consults called: Surgery, IR  Admission status: Inpatient     Evalene GORMAN Sprinkles, MD [facility]  11/15/2023, 11:29 PM

## 2023-11-16 ENCOUNTER — Encounter (HOSPITAL_COMMUNITY): Payer: Self-pay | Admitting: Family Medicine

## 2023-11-16 DIAGNOSIS — R188 Other ascites: Secondary | ICD-10-CM | POA: Diagnosis not present

## 2023-11-16 LAB — HEPATIC FUNCTION PANEL
ALT: 22 U/L (ref 0–44)
AST: 17 U/L (ref 15–41)
Albumin: 3.5 g/dL (ref 3.5–5.0)
Alkaline Phosphatase: 48 U/L (ref 38–126)
Bilirubin, Direct: 0.3 mg/dL — ABNORMAL HIGH (ref 0.0–0.2)
Indirect Bilirubin: 1.1 mg/dL — ABNORMAL HIGH (ref 0.3–0.9)
Total Bilirubin: 1.4 mg/dL — ABNORMAL HIGH (ref 0.0–1.2)
Total Protein: 6.5 g/dL (ref 6.5–8.1)

## 2023-11-16 LAB — BASIC METABOLIC PANEL
Anion gap: 8 (ref 5–15)
BUN: 13 mg/dL (ref 6–20)
CO2: 22 mmol/L (ref 22–32)
Calcium: 8.8 mg/dL — ABNORMAL LOW (ref 8.9–10.3)
Chloride: 103 mmol/L (ref 98–111)
Creatinine, Ser: 1.05 mg/dL (ref 0.61–1.24)
GFR, Estimated: >60 mL/min (ref >60)
Glucose, Bld: 105 mg/dL — ABNORMAL HIGH (ref 70–99)
Potassium: 4.1 mmol/L (ref 3.5–5.1)
Sodium: 133 mmol/L — ABNORMAL LOW (ref 135–145)

## 2023-11-16 LAB — CBC
HCT: 42.5 % (ref 39.0–52.0)
Hemoglobin: 14.2 g/dL (ref 13.0–17.0)
MCH: 30.1 pg (ref 26.0–34.0)
MCHC: 33.4 g/dL (ref 30.0–36.0)
MCV: 90 fL (ref 80.0–100.0)
Platelets: 238 10*3/uL (ref 150–400)
RBC: 4.72 MIL/uL (ref 4.22–5.81)
RDW: 13.8 % (ref 11.5–15.5)
WBC: 13.7 10*3/uL — ABNORMAL HIGH (ref 4.0–10.5)
nRBC: 0 % (ref 0.0–0.2)

## 2023-11-16 MED ORDER — LORATADINE 10 MG PO TABS
10.0000 mg | ORAL_TABLET | Freq: Every day | ORAL | Status: DC
  Administered 2023-11-17 – 2023-11-19 (×4): 10 mg via ORAL
  Filled 2023-11-16 (×4): qty 1

## 2023-11-16 NOTE — Plan of Care (Signed)
   Problem: Education: Goal: Knowledge of General Education information will improve Description Including pain rating scale, medication(s)/side effects and non-pharmacologic comfort measures Outcome: Progressing

## 2023-11-16 NOTE — Consult Note (Signed)
 Henry Paul 10/29/1966  [id number].    Requesting MD: Dr. Brigida Bureau Chief Complaint/Reason for Consult: RLQ inflammatory process with associated perforation  HPI:  This is a pleasant 58 yo white male who is known to our service as he was admitted in November at [facility] for diverticulitis.  He ultimately developed an abscess and required an IR perc drain to be placed.  This was removed in December.  He has been doing well since that time with no issues until Sunday night he developed a small amount of abdominal discomfort.  He then developed some chills and sweats.  His pain worsened yesterday, Monday, and he left work around [employer].  He denies any other symptoms such as N/V/D/blood in stool/fevers/CP/SOB/pneumaturia, etc.  He presented to [facility] ED and was found to have a WBC of 16K and a CT with mild free air in the mid abd which appears to be sequelae associated with perforation of the inflammatory process in the RLQ.  He has been started on abx therapy and we have been asked to see him.  ROS: ROS: see HPI  History reviewed. No pertinent family history.  Past Medical History:  Diagnosis Date   Fatty liver    GERD (gastroesophageal reflux disease)     Past Surgical History:  Procedure Laterality Date   APPENDECTOMY     CHOLECYSTECTOMY N/A 04/17/2021   Procedure: LAPAROSCOPIC CHOLECYSTECTOMY;  Surgeon: Vernetta Berg, MD;  Location: [facility];  Service: General;  Laterality: N/A;   COLONOSCOPY     FINGER SURGERY Left    ring and middle fingers reatttachment after amputation   IR RADIOLOGIST EVAL & MGMT  10/13/2023   WISDOM TOOTH EXTRACTION      Social History:  reports that he has never smoked. He has never used smokeless tobacco. He reports that he does not drink alcohol and does not use drugs.  Allergies: No Known Allergies  Medications Prior to Admission  Medication Sig Dispense Refill   pantoprazole  (PROTONIX ) 20 MG tablet Take 20 mg by mouth every evening.     tamsulosin   (FLOMAX ) 0.4 MG CAPS capsule Take 0.4 mg by mouth every evening.     acetaminophen  (TYLENOL ) 500 MG tablet Take 1,000 mg by mouth every 6 (six) hours as needed for mild pain (pain score 1-3) or moderate pain (pain score 4-6).     amoxicillin -clavulanate (AUGMENTIN ) 875-125 MG tablet Take 1 tablet by mouth 2 (two) times daily. 20 tablet 0   Sodium Chloride  Flush (NORMAL SALINE FLUSH) 0.9 % SOLN Flush abdominal drain with 5cc once daily. 300 mL 3     Physical Exam: Blood pressure 127/88, pulse (!) 101, temperature 99.5 F (37.5 C), temperature source Oral, resp. rate 18, height 5' 9 (1.753 m), weight 76.6 kg, SpO2 91%. General: pleasant, WD, WN white male who is laying in bed in NAD HEENT: head is normocephalic, atraumatic.  Sclera are noninjected.  PERRL.  Ears and nose without any masses or lesions.  Mouth is pink and moist Heart: regular, rate, and rhythm.  Normal s1,s2. No obvious murmurs, gallops, or rubs noted.  Palpable radial and pedal pulses bilaterally Lungs: CTAB, no wheezes, rhonchi, or rales noted.  Respiratory effort nonlabored Abd: soft, minimally tender in RLQ as he just got pain meds, no rebounding, guarding, or peritonitis, ND, +BS, no masses, hernias, or organomegaly Psych: A&Ox3 with an appropriate affect.   Results for orders placed or performed during the hospital encounter of 11/15/23 (from the past 48  hours)  Lipase, blood     Status: None   Collection Time: 11/15/23  3:54 PM  Result Value Ref Range   Lipase 20 11 - 51 U/L    Comment: Performed at [facility], [address], [place], [place] [address]  Comprehensive metabolic panel     Status: Abnormal   Collection Time: 11/15/23  3:54 PM  Result Value Ref Range   Sodium 132 (L) 135 - 145 mmol/L   Potassium 4.3 3.5 - 5.1 mmol/L   Chloride 98 98 - 111 mmol/L   CO2 24 22 - 32 mmol/L   Glucose, Bld 112 (H) 70 - 99 mg/dL    Comment: Glucose reference range applies only to samples taken after  fasting for at least 8 hours.   BUN 16 6 - 20 mg/dL   Creatinine, Ser 8.85 0.61 - 1.24 mg/dL   Calcium 9.6 8.9 - 89.6 mg/dL   Total Protein 7.6 6.5 - 8.1 g/dL   Albumin 4.7 3.5 - 5.0 g/dL   AST 15 15 - 41 U/L   ALT 26 0 - 44 U/L   Alkaline Phosphatase 50 38 - 126 U/L   Total Bilirubin 1.4 (H) 0.0 - 1.2 mg/dL   GFR, Estimated >39 >39 mL/min    Comment: (NOTE) Calculated using the CKD-EPI Creatinine Equation (2021)    Anion gap 10 5 - 15    Comment: Performed at [facility], [address], [place], [place] [address]  CBC     Status: Abnormal   Collection Time: 11/15/23  3:54 PM  Result Value Ref Range   WBC 16.7 (H) 4.0 - 10.5 K/uL   RBC 4.93 4.22 - 5.81 MIL/uL   Hemoglobin 14.5 13.0 - 17.0 g/dL   HCT 57.1 60.9 - 47.9 %   MCV 86.8 80.0 - 100.0 fL   MCH 29.4 26.0 - 34.0 pg   MCHC 33.9 30.0 - 36.0 g/dL   RDW 86.3 88.4 - 84.4 %   Platelets 293 150 - 400 K/uL   nRBC 0.0 0.0 - 0.2 %    Comment: Performed at [facility], [address], [place], [place] [address]  Urinalysis, Routine w reflex microscopic -Urine, Clean Catch     Status: Abnormal   Collection Time: 11/15/23  3:54 PM  Result Value Ref Range   Color, Urine YELLOW YELLOW   APPearance CLEAR CLEAR   Specific Gravity, Urine 1.043 (H) 1.005 - 1.030   pH 6.5 5.0 - 8.0   Glucose, UA NEGATIVE NEGATIVE mg/dL   Hgb urine dipstick TRACE (A) NEGATIVE   Bilirubin Urine NEGATIVE NEGATIVE   Ketones, ur NEGATIVE NEGATIVE mg/dL   Protein, ur TRACE (A) NEGATIVE mg/dL   Nitrite NEGATIVE NEGATIVE   Leukocytes,Ua NEGATIVE NEGATIVE   RBC / HPF 0-5 0 - 5 RBC/hpf   WBC, UA 0-5 0 - 5 WBC/hpf   Bacteria, UA NONE SEEN NONE SEEN   Squamous Epithelial / HPF 0-5 0 - 5 /HPF   Mucus PRESENT     Comment: Performed at [facility], [address], [place], [place] [address]  Lactic acid, plasma     Status: None   Collection Time: 11/15/23  3:54 PM  Result Value Ref Range   Lactic  Acid, Venous 1.5 0.5 - 1.9 mmol/L    Comment: Performed at [facility], [address], [place], [place] [address]  Blood culture (routine x 2)     Status: None (Preliminary result)   Collection  Time: 11/15/23  5:09 PM   Specimen: BLOOD  Result Value Ref Range   Specimen Description      BLOOD BLOOD LEFT WRIST Performed at [facility], [address] [place], [place] [address]    Special Requests      Blood Culture results may not be optimal due to an inadequate volume of blood received in culture bottles BOTTLES DRAWN AEROBIC AND ANAEROBIC Performed at [facility], [address] [place], [place] [address]    Culture      NO GROWTH < 12 HOURS Performed at [facility], [address] [place], [place] [address]    Report Status PENDING   Resp panel by RT-PCR (RSV, Flu A&B, Covid)     Status: None   Collection Time: 11/15/23  5:09 PM   Specimen: Nasal Swab  Result Value Ref Range   SARS Coronavirus 2 by RT PCR NEGATIVE NEGATIVE    Comment: (NOTE) SARS-CoV-2 target nucleic acids are NOT DETECTED.  The SARS-CoV-2 RNA is generally detectable in upper respiratory specimens during the acute phase of infection. The lowest concentration of SARS-CoV-2 viral copies this assay can detect is 138 copies/mL. A negative result does not preclude SARS-Cov-2 infection and should not be used as the sole basis for treatment or other patient management decisions. A negative result may occur with  improper specimen collection/handling, submission of specimen other than nasopharyngeal swab, presence of viral mutation(s) within the areas targeted by this assay, and inadequate number of viral copies(<138 copies/mL). A negative result must be combined with clinical observations, patient history, and epidemiological information. The expected result is Negative.  Fact Sheet for Patients:   bloggercourse.com  Fact Sheet for Healthcare Providers:  seriousbroker.it  This test is no t yet approved or cleared by the [place]  FDA and  has been authorized for detection and/or diagnosis of SARS-CoV-2 by FDA under an Emergency Use Authorization (EUA). This EUA will remain  in effect (meaning this test can be used) for the duration of the COVID-19 declaration under Section 564(b)(1) of the Act, 21 U.S.C.section 360bbb-3(b)(1), unless the authorization is terminated  or revoked sooner.       Influenza A by PCR NEGATIVE NEGATIVE   Influenza B by PCR NEGATIVE NEGATIVE    Comment: (NOTE) The Xpert Xpress SARS-CoV-2/FLU/RSV plus assay is intended as an aid in the diagnosis of influenza from Nasopharyngeal swab specimens and should not be used as a sole basis for treatment. Nasal washings and aspirates are unacceptable for Xpert Xpress SARS-CoV-2/FLU/RSV testing.  Fact Sheet for Patients: bloggercourse.com  Fact Sheet for Healthcare Providers: seriousbroker.it  This test is not yet approved or cleared by the [place]  FDA and has been authorized for detection and/or diagnosis of SARS-CoV-2 by FDA under an Emergency Use Authorization (EUA). This EUA will remain in effect (meaning this test can be used) for the duration of the COVID-19 declaration under Section 564(b)(1) of the Act, 21 U.S.C. section 360bbb-3(b)(1), unless the authorization is terminated or revoked.     Resp Syncytial Virus by PCR NEGATIVE NEGATIVE    Comment: (NOTE) Fact Sheet for Patients: bloggercourse.com  Fact Sheet for Healthcare Providers: seriousbroker.it  This test is not yet approved or cleared by the [place]  FDA and has been authorized for detection and/or diagnosis of SARS-CoV-2 by FDA under an Emergency Use Authorization (EUA).  This EUA will remain in effect (meaning this test can be used) for the duration of the COVID-19 declaration under Section  564(b)(1) of the Act, 21 U.S.C. section 360bbb-3(b)(1), unless the authorization is terminated or revoked.  Performed at [facility], [address] [place], [place] [address]   Protime-INR     Status: None   Collection Time: 11/15/23  5:09 PM  Result Value Ref Range   Prothrombin Time 13.9 11.4 - 15.2 seconds   INR 1.1 0.8 - 1.2    Comment: (NOTE) INR goal varies based on device and disease states. Performed at [facility], [address] [place], [place] [address]   APTT     Status: None   Collection Time: 11/15/23  5:09 PM  Result Value Ref Range   aPTT 31 24 - 36 seconds    Comment: Performed at [facility], [address], [place], [place] [address]  Blood culture (routine x 2)     Status: None (Preliminary result)   Collection Time: 11/15/23  5:14 PM   Specimen: BLOOD  Result Value Ref Range   Specimen Description      BLOOD BLOOD LEFT FOREARM Performed at [facility], [address] [place], [place] [address]    Special Requests      Blood Culture results may not be optimal due to an inadequate volume of blood received in culture bottles BOTTLES DRAWN AEROBIC AND ANAEROBIC Performed at [facility], [address] [place], [place] [address]    Culture      NO GROWTH < 12 HOURS Performed at [facility], [address] [place], [place] [address]    Report Status PENDING   Lactic acid, plasma     Status: None   Collection Time: 11/15/23  5:54 PM  Result Value Ref Range   Lactic Acid, Venous 1.2 0.5 - 1.9 mmol/L    Comment: Performed at [facility], [address] [place], [place] [address]  Basic metabolic panel     Status: Abnormal   Collection Time: 11/16/23  4:37 AM  Result Value Ref Range   Sodium 133 (L)  135 - 145 mmol/L   Potassium 4.1 3.5 - 5.1 mmol/L   Chloride 103 98 - 111 mmol/L   CO2 22 22 - 32 mmol/L   Glucose, Bld 105 (H) 70 - 99 mg/dL    Comment: Glucose reference range applies only to samples taken after fasting for at least 8 hours.   BUN 13 6 - 20 mg/dL   Creatinine, Ser 8.94 0.61 - 1.24 mg/dL   Calcium 8.8 (L) 8.9 - 10.3 mg/dL   GFR, Estimated >39 >39 mL/min    Comment: (NOTE) Calculated using the CKD-EPI Creatinine Equation (2021)    Anion gap 8 5 - 15    Comment: Performed at [facility], [address], [place], [place] [address]  Hepatic function panel     Status: Abnormal   Collection Time: 11/16/23  4:37 AM  Result Value Ref Range   Total Protein 6.5 6.5 - 8.1 g/dL   Albumin 3.5 3.5 - 5.0 g/dL   AST 17 15 - 41 U/L   ALT 22 0 - 44 U/L   Alkaline Phosphatase 48 38 - 126 U/L   Total Bilirubin 1.4 (H) 0.0 - 1.2 mg/dL   Bilirubin, Direct 0.3 (H) 0.0 - 0.2 mg/dL   Indirect Bilirubin 1.1 (H) 0.3 - 0.9 mg/dL    Comment: Performed at [facility], [address], [place], [place] [address]  CBC     Status: Abnormal  Collection Time: 11/16/23  4:37 AM  Result Value Ref Range   WBC 13.7 (H) 4.0 - 10.5 K/uL   RBC 4.72 4.22 - 5.81 MIL/uL   Hemoglobin 14.2 13.0 - 17.0 g/dL   HCT 57.4 60.9 - 47.9 %   MCV 90.0 80.0 - 100.0 fL   MCH 30.1 26.0 - 34.0 pg   MCHC 33.4 30.0 - 36.0 g/dL   RDW 86.1 88.4 - 84.4 %   Platelets 238 150 - 400 K/uL   nRBC 0.0 0.0 - 0.2 %    Comment: Performed at [facility], [address], [place], [place] [address]   CT ABDOMEN PELVIS W CONTRAST Result Date: 11/15/2023 CLINICAL DATA:  Abdominal pain. EXAM: CT ABDOMEN AND PELVIS WITH CONTRAST TECHNIQUE: Multidetector CT imaging of the abdomen and pelvis was performed using the standard protocol following bolus administration of intravenous contrast. RADIATION DOSE REDUCTION: This exam was performed according to the departmental dose-optimization program which includes automated  exposure control, adjustment of the mA and/or kV according to patient size and/or use of iterative reconstruction technique. CONTRAST:  OMNIPAQUE  IOHEXOL  300 MG/ML  SOLN COMPARISON:  October 13, 2023 FINDINGS: Lower chest: No acute abnormality. Hepatobiliary: No focal liver abnormality is seen. Status post cholecystectomy. No biliary dilatation. Pancreas: Unremarkable. No pancreatic ductal dilatation or surrounding inflammatory changes. Spleen: Normal in size without focal abnormality. Adrenals/Urinary Tract: Adrenal glands are unremarkable. Kidneys are normal in size, without renal calculi or hydronephrosis. Multiple stable bilateral simple renal cysts are seen. Bladder is unremarkable. Stomach/Bowel: Stomach is within normal limits. The appendix is surgically absent. A short segment dilated small bowel is seen within the anterior aspect of the mid left abdomen (maximum small bowel diameter of approximately 3.1 cm). A transition zone is seen within the region anterior to the left kidney (axial CT images 34 through 39, CT series 2). Noninflamed diverticula are seen throughout the sigmoid colon. The right lower quadrant percutaneous drainage catheter seen on the prior study has been removed. A residual 2.2 cm x 2.7 cm x 2.3 cm extraluminal collection of fluid and air is seen within this region (axial CT image 60, CT series 2/coronal reformatted image 56, CT series 6). A mild to moderate amount of free air is seen within the mid to upper abdomen. Mild mesenteric inflammatory fat stranding is also seen within the lower abdomen and pelvis, right greater than left. Mildly inflamed small bowel loops are also noted within this region. Vascular/Lymphatic: Aortic atherosclerosis. No enlarged abdominal or pelvic lymph nodes. Reproductive: There is mild to moderate severity prostate gland enlargement. Other: No abdominal wall hernia or abnormality. No abdominopelvic ascites. Musculoskeletal: No acute or significant  osseous findings. IMPRESSION: 1. Short segment of dilated proximal small bowel which may be transient in nature. Sequelae associated with an early partial small bowel obstruction cannot be excluded. 2. Mild to moderate amount of free air within the mid to upper abdomen which represents a new finding when compared to the prior study and likely represents sequelae associated with perforation associated with the inflammatory process seen within the right lower quadrant. 3. Interval removal of the right lower quadrant percutaneous drainage catheter with a residual 2.2 cm x 2.7 cm x 2.3 cm extraluminal collection of fluid and air within this region. 4. Mild enteritis involving loops of mid and distal ileum. 5. Sigmoid diverticulosis. 6. Multiple stable bilateral simple renal cysts. No follow-up imaging is recommended. This recommendation follows ACR consensus guidelines: Management of the Incidental Renal Mass  on CT: A White Paper of the ACR Incidental Findings Committee. J Am Coll Radiol [phone]. 7. Aortic atherosclerosis. Aortic Atherosclerosis (ICD10-I70.0). Electronically Signed   By: Suzen Dials M.D.   On: 11/15/2023 19:40      Assessment/Plan RLQ inflammatory process with associated perforation, ?R-sided diverticulitis vs sigmoid diverticulitis  The patient has been seen, examined, labs, vitals, chart, and imaging thoroughly reviewed.  He has a significant amount of inflammation in his pelvis along with an associated perforation, but not a tremendous amount of free air.  He does not have an abscess that is drainable at this time either.  The patient's WBC is down to 13K today from 16K yesterday.  He is AF and his abdominal exam is relatively benign with no rebounding, guarding, or peritonitis.  The majority of his inflammation is noted in the RLQ on CT and his abdominal exam is c/w this finding.  It does appear there may be a small segment of small bowel that might be involved in this area as  well, unclear.  He has had his appendix removed in the past so this isn't related to that.  Given the patient is very stable with a benign abdominal exam right now, we will continue to treat conservatively with CLD and IV abx therapy.  We would still like for him to improve conservatively to get him to his colonoscopy and then surgical follow up electively.  However, if he acutely worsens, he may require a Hartmann's procedure which we have discussed.     FEN - CLD/IVFs VTE - ok for chemical prophylaxis from our standpoint ID - Cefepime /Flagyl    I reviewed ED provider notes, hospitalist notes, last 24 h vitals and pain scores, last 48 h intake and output, last 24 h labs and trends, and last 24 h imaging results.  Burnard FORBES Banter, [facility] Surgery 11/16/2023, 9:30 AM Please see Amion for pager number during day hours 7:00am-4:30pm or 7:00am -11:30am on weekends

## 2023-11-16 NOTE — Progress Notes (Signed)
 PROGRESS NOTE  Henry Paul FMW:[id number] DOB: 1965-11-14 DOA: 11/15/2023 PCP: Ransom Other, MD  Brief History   The patient is a 58 yr old man who presented to [facility] with complaining of acute onset of sweats, chills, and generalilzed abdominal pain on 11/14/2023. The pain worsened on 11/15/2023 after he tried to go to work. He then went to the ED. He has had nausea, but no vomiting. The patient has a recent history with diveriticulitis. He was admitted to on 09/24/2023 with a perforated sigmoid diverticulitis. He was given IV Zosyn , but failed to improved. Repeat CT demonstrated a fluid collection in the RLQ. A percutaneous drain was placed by IR. He continued to improve on an oral coarse of augmentin  with the drain in place. The drain was removed on 10/13/2023 after repeat CT demonstrated complete resolution of the abscess.   The patient carries a past medical history significant for BPH, GERD, Fatty liver, and a perforated sigmoid diverticulitis in 09/2023.   In the ED at [facility] the patient's was found to be tachycardic, but with otherwise normal vital signs. His labwork demonstrated a mild hyponatremia (132), WBC of 16.7, and a negative respiratory panel.   Surgery and IR were consulted by ED, blood cultures were obtained, and the patient received Rocephin , Flagyl , and a liter of LR. He was transferred to [facility] where he was admitted by Dr. Charlton on 11/15/2023. CT abdomen demonstrated an intra-abdominal fluid collection and a pneumoperitoneum. General surgery states that the evolving abscess appears to communicate with the sigmoid colon. They have recommended continuing IV antibiotics and observing him closely. He may require urgent surgery if it continues to worsen.   A & P  Intra-abdominal fluid-collection, possible evolving abscess  The patient is admitted to a medical bed. General surgery has been consulted. Their assistance is greatly appreciated. He is  receiving IV Cefepime  and Flagyl . Blood cultures have been obtained, but are still pending. He is on a clear liquid diet.  Mild Hyponatremia NA is 133. Likely due to mild hypovolemia. The patient is receiving IV fluids. Monitor carefully.  Sepsis The patient presented with tachycardia, tachypnea, and leukocytosis of 16.7 in the setting of a known source of infection. He received emergent IV fluids and antibiotics.  I have seen and examined this patient myself. I have spent 34 minutes in his evaluation and care.  DVT prophylaxis: SCD's Code Status: Full Code Family Communication: None available Disposition Plan: tbd    Henry Nijjar, DO [facility] Direct contact: see [email or web address]  7PM-7AM contact night coverage as above 11/16/2023, 6:40 PM  LOS: 1 day   Consultants  General surgery Interventional radiology  Procedures  None  Antibiotics   Anti-infectives (From admission, onward)    Start     Dose/Rate Route Frequency Ordered Stop   11/16/23 0800  metroNIDAZOLE  (FLAGYL ) IVPB 500 mg        500 mg 100 mL/hr over 60 Minutes Intravenous 2 times daily 11/15/23 2328 11/23/23 0959   11/16/23 0200  ceFEPIme  (MAXIPIME ) 2 g in sodium chloride  0.9 % 100 mL IVPB        2 g 200 mL/hr over 30 Minutes Intravenous Every 8 hours 11/15/23 2341     11/15/23 1715  cefTRIAXone  (ROCEPHIN ) 2 g in sodium chloride  0.9 % 100 mL IVPB        2 g 200 mL/hr over 30 Minutes Intravenous Once 11/15/23 1709 11/15/23 2016   11/15/23 1715  metroNIDAZOLE  (FLAGYL ) IVPB 500 mg  500 mg 100 mL/hr over 60 Minutes Intravenous  Once 11/15/23 1709 11/15/23 2016        Interval History/Subjective  The patient is resting comfortably. He states that his pain is improved.  Objective   Vitals:  Vitals:   11/16/23 0818 11/16/23 1601  BP: 127/88 (!) 135/93  Pulse: (!) 101 90  Resp: 18 18  Temp: 99.5 F (37.5 C) 98.5 F (36.9 C)  SpO2: 91% 97%    Exam:  Exam:  Constitutional:  The  patient is awake, alert, and oriented x 3. No acute distress. Respiratory:  No increased work of breathing. No wheezes, rales, or rhonchi No tactile fremitus Cardiovascular:  Regular rate and rhythm No murmurs, ectopy, or gallups. No lateral PMI. No thrills. Abdomen:  Abdomen is soft and non-distended The abdomen is diffusely tender, especially so in the left lower quadrant No hernias, masses, or organomegaly Hypoactive bowel sounds.  Musculoskeletal:  No cyanosis, clubbing, or edema Skin:  No rashes, lesions, ulcers palpation of skin: no induration or nodules Neurologic:  CN 2-12 intact Sensation all 4 extremities intact Psychiatric:  Mental status Mood, affect appropriate Orientation to person, place, time  judgment and insight appear intact   I have personally reviewed the following:   Today's Data   Vitals:   11/16/23 0818 11/16/23 1601  BP: 127/88 (!) 135/93  Pulse: (!) 101 90  Resp: 18 18  Temp: 99.5 F (37.5 C) 98.5 F (36.9 C)  SpO2: 91% 97%     Lab Data  CBC    Component Value Date/Time   WBC 13.7 (H) 11/16/2023 0437   RBC 4.72 11/16/2023 0437   HGB 14.2 11/16/2023 0437   HCT 42.5 11/16/2023 0437   PLT 238 11/16/2023 0437   MCV 90.0 11/16/2023 0437   MCH 30.1 11/16/2023 0437   MCHC 33.4 11/16/2023 0437   RDW 13.8 11/16/2023 0437   LYMPHSABS 1.8 09/30/2023 0543   MONOABS 0.9 09/30/2023 0543   EOSABS 0.4 09/30/2023 0543   BASOSABS 0.1 09/30/2023 0543      Latest Ref Rng & Units 11/16/2023    4:37 AM 11/15/2023    3:54 PM 09/30/2023    5:43 AM  BMP  Glucose 70 - 99 mg/dL 894  887  87   BUN 6 - 20 mg/dL 13  16  21    Creatinine 0.61 - 1.24 mg/dL 8.94  8.85  8.98   Sodium 135 - 145 mmol/L 133  132  132   Potassium 3.5 - 5.1 mmol/L 4.1  4.3  4.0   Chloride 98 - 111 mmol/L 103  98  101   CO2 22 - 32 mmol/L 22  24  19    Calcium 8.9 - 10.3 mg/dL 8.8  9.6  8.5      Micro Data   Results for orders placed or performed during the hospital  encounter of 11/15/23  Blood culture (routine x 2)     Status: None (Preliminary result)   Collection Time: 11/15/23  5:09 PM   Specimen: BLOOD  Result Value Ref Range Status   Specimen Description   Final    BLOOD BLOOD LEFT WRIST Performed at [facility], [address] [place], [place], [place] [address]    Special Requests   Final    Blood Culture results may not be optimal due to an inadequate volume of blood received in culture bottles BOTTLES DRAWN AEROBIC AND ANAEROBIC Performed at [facility], [address] [place], [place] [address]  Culture   Final    NO GROWTH < 12 HOURS Performed at [facility], [address] [place], [place] [address]    Report Status PENDING  Incomplete  Resp panel by RT-PCR (RSV, Flu A&B, Covid)     Status: None   Collection Time: 11/15/23  5:09 PM   Specimen: Nasal Swab  Result Value Ref Range Status   SARS Coronavirus 2 by RT PCR NEGATIVE NEGATIVE Final    Comment: (NOTE) SARS-CoV-2 target nucleic acids are NOT DETECTED.  The SARS-CoV-2 RNA is generally detectable in upper respiratory specimens during the acute phase of infection. The lowest concentration of SARS-CoV-2 viral copies this assay can detect is 138 copies/mL. A negative result does not preclude SARS-Cov-2 infection and should not be used as the sole basis for treatment or other patient management decisions. A negative result may occur with  improper specimen collection/handling, submission of specimen other than nasopharyngeal swab, presence of viral mutation(s) within the areas targeted by this assay, and inadequate number of viral copies(<138 copies/mL). A negative result must be combined with clinical observations, patient history, and epidemiological information. The expected result is Negative.  Fact Sheet for Patients:  bloggercourse.com  Fact Sheet for Healthcare Providers:   seriousbroker.it  This test is no t yet approved or cleared by the [place]  FDA and  has been authorized for detection and/or diagnosis of SARS-CoV-2 by FDA under an Emergency Use Authorization (EUA). This EUA will remain  in effect (meaning this test can be used) for the duration of the COVID-19 declaration under Section 564(b)(1) of the Act, 21 U.S.C.section 360bbb-3(b)(1), unless the authorization is terminated  or revoked sooner.       Influenza A by PCR NEGATIVE NEGATIVE Final   Influenza B by PCR NEGATIVE NEGATIVE Final    Comment: (NOTE) The Xpert Xpress SARS-CoV-2/FLU/RSV plus assay is intended as an aid in the diagnosis of influenza from Nasopharyngeal swab specimens and should not be used as a sole basis for treatment. Nasal washings and aspirates are unacceptable for Xpert Xpress SARS-CoV-2/FLU/RSV testing.  Fact Sheet for Patients: bloggercourse.com  Fact Sheet for Healthcare Providers: seriousbroker.it  This test is not yet approved or cleared by the [place]  FDA and has been authorized for detection and/or diagnosis of SARS-CoV-2 by FDA under an Emergency Use Authorization (EUA). This EUA will remain in effect (meaning this test can be used) for the duration of the COVID-19 declaration under Section 564(b)(1) of the Act, 21 U.S.C. section 360bbb-3(b)(1), unless the authorization is terminated or revoked.     Resp Syncytial Virus by PCR NEGATIVE NEGATIVE Final    Comment: (NOTE) Fact Sheet for Patients: bloggercourse.com  Fact Sheet for Healthcare Providers: seriousbroker.it  This test is not yet approved or cleared by the [place]  FDA and has been authorized for detection and/or diagnosis of SARS-CoV-2 by FDA under an Emergency Use Authorization (EUA). This EUA will remain in effect (meaning this test can be used) for  the duration of the COVID-19 declaration under Section 564(b)(1) of the Act, 21 U.S.C. section 360bbb-3(b)(1), unless the authorization is terminated or revoked.  Performed at [facility], [address] [place], [place] [address]   Blood culture (routine x 2)     Status: None (Preliminary result)   Collection Time: 11/15/23  5:14 PM   Specimen: BLOOD  Result Value Ref Range Status   Specimen Description   Final    BLOOD BLOOD LEFT FOREARM Performed at [facility], [address] [place], [place] [address]    Special Requests   Final    Blood Culture results may not be optimal due to an inadequate volume of blood received in culture bottles BOTTLES DRAWN AEROBIC AND ANAEROBIC Performed at [facility], [address] [place], [place] [address]    Culture   Final    NO GROWTH < 12 HOURS Performed at [facility], [address] [place], [place] [address]    Report Status PENDING  Incomplete     Imaging  CT abdomen and pelvis.  Scheduled Meds:  pantoprazole  (PROTONIX ) IV  40 mg Intravenous Daily   Continuous Infusions:  ceFEPime  (MAXIPIME ) IV 2 g (11/16/23 1712)   lactated ringers  75 mL/hr at 11/16/23 0027   metronidazole  500 mg (11/16/23 0849)    Principal Problem:   Intraabdominal fluid collection Active Problems:   Pneumoperitoneum   LOS: 1 day

## 2023-11-17 DIAGNOSIS — R188 Other ascites: Secondary | ICD-10-CM | POA: Diagnosis not present

## 2023-11-17 LAB — BASIC METABOLIC PANEL
Anion gap: 12 (ref 5–15)
BUN: 12 mg/dL (ref 6–20)
CO2: 19 mmol/L — ABNORMAL LOW (ref 22–32)
Calcium: 8.5 mg/dL — ABNORMAL LOW (ref 8.9–10.3)
Chloride: 98 mmol/L (ref 98–111)
Creatinine, Ser: 0.95 mg/dL (ref 0.61–1.24)
GFR, Estimated: >60 mL/min (ref >60)
Glucose, Bld: 86 mg/dL (ref 70–99)
Potassium: 3.9 mmol/L (ref 3.5–5.1)
Sodium: 129 mmol/L — ABNORMAL LOW (ref 135–145)

## 2023-11-17 LAB — CBC
HCT: 40 % (ref 39.0–52.0)
Hemoglobin: 13.5 g/dL (ref 13.0–17.0)
MCH: 29.9 pg (ref 26.0–34.0)
MCHC: 33.8 g/dL (ref 30.0–36.0)
MCV: 88.7 fL (ref 80.0–100.0)
Platelets: 228 10*3/uL (ref 150–400)
RBC: 4.51 MIL/uL (ref 4.22–5.81)
RDW: 13.4 % (ref 11.5–15.5)
WBC: 11.6 10*3/uL — ABNORMAL HIGH (ref 4.0–10.5)
nRBC: 0 % (ref 0.0–0.2)

## 2023-11-17 MED ORDER — PROCHLORPERAZINE EDISYLATE 10 MG/2ML IJ SOLN
10.0000 mg | Freq: Four times a day (QID) | INTRAMUSCULAR | Status: DC | PRN
  Administered 2023-11-17: 10 mg via INTRAVENOUS
  Filled 2023-11-17: qty 2

## 2023-11-17 MED ORDER — ENOXAPARIN SODIUM 40 MG/0.4ML IJ SOSY
40.0000 mg | PREFILLED_SYRINGE | INTRAMUSCULAR | Status: DC
  Administered 2023-11-17 – 2023-11-18 (×2): 40 mg via SUBCUTANEOUS
  Filled 2023-11-17 (×2): qty 0.4

## 2023-11-17 MED ORDER — TRAZODONE HCL 50 MG PO TABS
50.0000 mg | ORAL_TABLET | Freq: Every evening | ORAL | Status: DC | PRN
  Administered 2023-11-17 – 2023-11-18 (×2): 50 mg via ORAL
  Filled 2023-11-17 (×2): qty 1

## 2023-11-17 NOTE — Progress Notes (Signed)
 Subjective: CC: Reports generalized abdominal pain that was worst in the RLQ yesterday required 6 doses of dilaudid  throughout the day. He reports he has had 3-4 semi-formed bm's this am that are non-bloody and since then his pain has improved. He has no pain presently at rest and reports he only has abdominal pain that is central before he has another bm. No prn pain medication since 0214 this am. Some nausea this am that he thinks was from not eating. Resolved after zofran . Tolerating cld now without n/v.   Afebrile. Tachycardia resolved. Not on BB. No hypotension. WBC downtrending at 11.6 from 13.   Objective: Vital signs in last 24 hours: Temp:  [98 F (36.7 C)-98.6 F (37 C)] 98.6 F (37 C) (01/15 0741) Pulse Rate:  [90-91] 90 (01/15 0741) Resp:  [18-20] 18 (01/15 0741) BP: (125-144)/(93-98) 144/96 (01/15 0741) SpO2:  [96 %-98 %] 98 % (01/15 0741) Last BM Date : 11/15/23  Intake/Output from previous day: No intake/output data recorded. Intake/Output this shift: No intake/output data recorded.  PE: Gen:  Alert, NAD, pleasant Abd: Soft, ND, peri-umbilical and RLQ ttp without peritonitis. +BS  Lab Results:  Recent Labs    11/16/23 0437 11/17/23 0553  WBC 13.7* 11.6*  HGB 14.2 13.5  HCT 42.5 40.0  PLT 238 228   BMET Recent Labs    11/16/23 0437 11/17/23 0553  NA 133* 129*  K 4.1 3.9  CL 103 98  CO2 22 19*  GLUCOSE 105* 86  BUN 13 12  CREATININE 1.05 0.95  CALCIUM 8.8* 8.5*   PT/INR Recent Labs    11/15/23 1709  LABPROT 13.9  INR 1.1   CMP     Component Value Date/Time   NA 129 (L) 11/17/2023 0553   K 3.9 11/17/2023 0553   CL 98 11/17/2023 0553   CO2 19 (L) 11/17/2023 0553   GLUCOSE 86 11/17/2023 0553   BUN 12 11/17/2023 0553   CREATININE 0.95 11/17/2023 0553   CALCIUM 8.5 (L) 11/17/2023 0553   PROT 6.5 11/16/2023 0437   ALBUMIN 3.5 11/16/2023 0437   AST 17 11/16/2023 0437   ALT 22 11/16/2023 0437   ALKPHOS 48 11/16/2023 0437    BILITOT 1.4 (H) 11/16/2023 0437   GFRNONAA >60 11/17/2023 0553   GFRAA >60 01/17/2018 0451   Lipase     Component Value Date/Time   LIPASE 20 11/15/2023 1554    Studies/Results: CT ABDOMEN PELVIS W CONTRAST Result Date: 11/15/2023 CLINICAL DATA:  Abdominal pain. EXAM: CT ABDOMEN AND PELVIS WITH CONTRAST TECHNIQUE: Multidetector CT imaging of the abdomen and pelvis was performed using the standard protocol following bolus administration of intravenous contrast. RADIATION DOSE REDUCTION: This exam was performed according to the departmental dose-optimization program which includes automated exposure control, adjustment of the mA and/or kV according to patient size and/or use of iterative reconstruction technique. CONTRAST:  OMNIPAQUE  IOHEXOL  300 MG/ML  SOLN COMPARISON:  October 13, 2023 FINDINGS: Lower chest: No acute abnormality. Hepatobiliary: No focal liver abnormality is seen. Status post cholecystectomy. No biliary dilatation. Pancreas: Unremarkable. No pancreatic ductal dilatation or surrounding inflammatory changes. Spleen: Normal in size without focal abnormality. Adrenals/Urinary Tract: Adrenal glands are unremarkable. Kidneys are normal in size, without renal calculi or hydronephrosis. Multiple stable bilateral simple renal cysts are seen. Bladder is unremarkable. Stomach/Bowel: Stomach is within normal limits. The appendix is surgically absent. A short segment dilated small bowel is seen within the anterior aspect of the mid left abdomen (  maximum small bowel diameter of approximately 3.1 cm). A transition zone is seen within the region anterior to the left kidney (axial CT images 34 through 39, CT series 2). Noninflamed diverticula are seen throughout the sigmoid colon. The right lower quadrant percutaneous drainage catheter seen on the prior study has been removed. A residual 2.2 cm x 2.7 cm x 2.3 cm extraluminal collection of fluid and air is seen within this region (axial CT image 60,  CT series 2/coronal reformatted image 56, CT series 6). A mild to moderate amount of free air is seen within the mid to upper abdomen. Mild mesenteric inflammatory fat stranding is also seen within the lower abdomen and pelvis, right greater than left. Mildly inflamed small bowel loops are also noted within this region. Vascular/Lymphatic: Aortic atherosclerosis. No enlarged abdominal or pelvic lymph nodes. Reproductive: There is mild to moderate severity prostate gland enlargement. Other: No abdominal wall hernia or abnormality. No abdominopelvic ascites. Musculoskeletal: No acute or significant osseous findings. IMPRESSION: 1. Short segment of dilated proximal small bowel which may be transient in nature. Sequelae associated with an early partial small bowel obstruction cannot be excluded. 2. Mild to moderate amount of free air within the mid to upper abdomen which represents a new finding when compared to the prior study and likely represents sequelae associated with perforation associated with the inflammatory process seen within the right lower quadrant. 3. Interval removal of the right lower quadrant percutaneous drainage catheter with a residual 2.2 cm x 2.7 cm x 2.3 cm extraluminal collection of fluid and air within this region. 4. Mild enteritis involving loops of mid and distal ileum. 5. Sigmoid diverticulosis. 6. Multiple stable bilateral simple renal cysts. No follow-up imaging is recommended. This recommendation follows ACR consensus guidelines: Management of the Incidental Renal Mass on CT: A White Paper of the ACR Incidental Findings Committee. J Am Coll Radiol [id number]. 7. Aortic atherosclerosis. Aortic Atherosclerosis (ICD10-I70.0). Electronically Signed   By: Virgle Grime M.D.   On: 11/15/2023 19:40    Anti-infectives: Anti-infectives (From admission, onward)    Start     Dose/Rate Route Frequency Ordered Stop   11/16/23 0800  metroNIDAZOLE  (FLAGYL ) IVPB 500 mg        500  mg 100 mL/hr over 60 Minutes Intravenous 2 times daily 11/15/23 2328 11/23/23 0959   11/16/23 0200  ceFEPIme  (MAXIPIME ) 2 g in sodium chloride  0.9 % 100 mL IVPB        2 g 200 mL/hr over 30 Minutes Intravenous Every 8 hours 11/15/23 2341     11/15/23 1715  cefTRIAXone  (ROCEPHIN ) 2 g in sodium chloride  0.9 % 100 mL IVPB        2 g 200 mL/hr over 30 Minutes Intravenous Once 11/15/23 1709 11/15/23 2016   11/15/23 1715  metroNIDAZOLE  (FLAGYL ) IVPB 500 mg        500 mg 100 mL/hr over 60 Minutes Intravenous  Once 11/15/23 1709 11/15/23 2016        Assessment/Plan RLQ inflammatory process with associated perforation, ?R-sided diverticulitis vs sigmoid diverticulitis  - Patient has hx of admission in November at [facility] for diverticulitis. He ultimately developed an abscess and required an IR perc drain to be placed. This was removed in December.  - CT demonstrates small amount of free air but no significant free fluid. There is a phlegmon/evolving possible abscess in his lower abdomen that it does appear to potentially abut or even communicate with sigmoid colon. Nothing obviously drainable at this time.  -  He is currently afebrile. Tachycardia resolved. No hypotension. WBC down at 11.6 from 13.7. Lactic was wnl on admission. No peritonitis on exam.No current indication for emergency surgery - Cont with IV antibiotics - Adv to FLD. AM labs.  - Hopefully patient will improve with conservative treatment.  If patient fails to improve they may require repeating imaging, drain placement, or surgical intervention resulting in a colectomy/colostomy.  This was discussed with the patient. - If patient improves with conservative therapies would recommend colonoscopy in ~6-8 weeks. He is currently scheduled for one in February. After this he will need follow up with colorectal surgery.  - We will follow with you  FEN - FLD  VTE - SCDs, Lovenox   ID - Cefepime /Flagyl .   I reviewed nursing notes, last 24 h  vitals and pain scores, last 48 h intake and output, last 24 h labs and trends, and last 24 h imaging results.    LOS: 2 days    Delton Filbert , Ms [facility] 11/17/2023, 12:17 PM Please see Amion for pager number during day hours 7:00am-4:30pm

## 2023-11-17 NOTE — Plan of Care (Signed)

## 2023-11-18 DIAGNOSIS — R188 Other ascites: Secondary | ICD-10-CM | POA: Diagnosis not present

## 2023-11-18 LAB — CBC
HCT: 39.5 % (ref 39.0–52.0)
Hemoglobin: 13.6 g/dL (ref 13.0–17.0)
MCH: 30.3 pg (ref 26.0–34.0)
MCHC: 34.4 g/dL (ref 30.0–36.0)
MCV: 88 fL (ref 80.0–100.0)
Platelets: 283 10*3/uL (ref 150–400)
RBC: 4.49 MIL/uL (ref 4.22–5.81)
RDW: 13.3 % (ref 11.5–15.5)
WBC: 12.1 10*3/uL — ABNORMAL HIGH (ref 4.0–10.5)
nRBC: 0 % (ref 0.0–0.2)

## 2023-11-18 LAB — BASIC METABOLIC PANEL
Anion gap: 12 (ref 5–15)
BUN: 13 mg/dL (ref 6–20)
CO2: 21 mmol/L — ABNORMAL LOW (ref 22–32)
Calcium: 8.8 mg/dL — ABNORMAL LOW (ref 8.9–10.3)
Chloride: 100 mmol/L (ref 98–111)
Creatinine, Ser: 1.03 mg/dL (ref 0.61–1.24)
GFR, Estimated: >60 mL/min (ref >60)
Glucose, Bld: 111 mg/dL — ABNORMAL HIGH (ref 70–99)
Potassium: 3.7 mmol/L (ref 3.5–5.1)
Sodium: 133 mmol/L — ABNORMAL LOW (ref 135–145)

## 2023-11-18 MED ORDER — METOPROLOL TARTRATE 25 MG PO TABS
25.0000 mg | ORAL_TABLET | Freq: Two times a day (BID) | ORAL | Status: DC
  Administered 2023-11-18 – 2023-11-19 (×3): 25 mg via ORAL
  Filled 2023-11-18 (×3): qty 1

## 2023-11-18 NOTE — Progress Notes (Signed)
Subjective: RLQ pain improved this morning but worsened last night after turning the wrong way.  Tolerating FLD  Objective: Vital signs in last 24 hours: Temp:  [98 F (36.7 C)-98.5 F (36.9 C)] 98 F (36.7 C) (01/16 0736) Pulse Rate:  [94-114] 95 (01/16 0736) Resp:  [18] 18 (01/16 0736) BP: (130-166)/(90-96) 130/90 (01/16 0736) SpO2:  [97 %-98 %] 97 % (01/16 0736) Last BM Date : 11/17/23  Intake/Output from previous day: No intake/output data recorded. Intake/Output this shift: No intake/output data recorded.  PE: Gen:  Alert, NAD, pleasant Abd: Soft, ND, mild RLQ tenderness, +BS  Lab Results:  Recent Labs    11/17/23 0553 11/18/23 0417  WBC 11.6* 12.1*  HGB 13.5 13.6  HCT 40.0 39.5  PLT 228 283   BMET Recent Labs    11/17/23 0553 11/18/23 0417  NA 129* 133*  K 3.9 3.7  CL 98 100  CO2 19* 21*  GLUCOSE 86 111*  BUN 12 13  CREATININE 0.95 1.03  CALCIUM 8.5* 8.8*   PT/INR Recent Labs    11/15/23 1709  LABPROT 13.9  INR 1.1   CMP     Component Value Date/Time   NA 133 (L) 11/18/2023 0417   K 3.7 11/18/2023 0417   CL 100 11/18/2023 0417   CO2 21 (L) 11/18/2023 0417   GLUCOSE 111 (H) 11/18/2023 0417   BUN 13 11/18/2023 0417   CREATININE 1.03 11/18/2023 0417   CALCIUM 8.8 (L) 11/18/2023 0417   PROT 6.5 11/16/2023 0437   ALBUMIN 3.5 11/16/2023 0437   AST 17 11/16/2023 0437   ALT 22 11/16/2023 0437   ALKPHOS 48 11/16/2023 0437   BILITOT 1.4 (H) 11/16/2023 0437   GFRNONAA >60 11/18/2023 0417   GFRAA >60 01/17/2018 0451   Lipase     Component Value Date/Time   LIPASE 20 11/15/2023 1554    Studies/Results: No results found.   Anti-infectives: Anti-infectives (From admission, onward)    Start     Dose/Rate Route Frequency Ordered Stop   11/16/23 0800  metroNIDAZOLE (FLAGYL) IVPB 500 mg        500 mg 100 mL/hr over 60 Minutes Intravenous 2 times daily 11/15/23 2328 11/23/23 0959   11/16/23 0200  ceFEPIme (MAXIPIME) 2 g in sodium  chloride 0.9 % 100 mL IVPB        2 g 200 mL/hr over 30 Minutes Intravenous Every 8 hours 11/15/23 2341     11/15/23 1715  cefTRIAXone (ROCEPHIN) 2 g in sodium chloride 0.9 % 100 mL IVPB        2 g 200 mL/hr over 30 Minutes Intravenous Once 11/15/23 1709 11/15/23 2016   11/15/23 1715  metroNIDAZOLE (FLAGYL) IVPB 500 mg        500 mg 100 mL/hr over 60 Minutes Intravenous  Once 11/15/23 1709 11/15/23 2016        Assessment/Plan RLQ inflammatory process with associated perforation, ?R-sided diverticulitis vs sigmoid diverticulitis  - Patient has hx of admission in November at [facility] for diverticulitis. He ultimately developed an abscess and required an IR perc drain to be placed. This was removed in December.  - CT demonstrates small amount of free air but no significant free fluid. There is a phlegmon/evolving possible abscess in his lower abdomen that it does appear to potentially abut or even communicate with sigmoid colon. Nothing obviously drainable at this time.  - adv to soft diet - WBC 12 from 11 yesterday.  AF.  Pain seems to overall be improving, but will continue to monitor on IV abx therapy.  May need a repeat CT scan at some point. - Hopefully patient will improve with conservative treatment.  If patient fails to improve they may require repeating imaging, drain placement, or surgical intervention resulting in a colectomy/colostomy.  This was discussed with the patient. - He is currently scheduled for one in February. After this he will need follow up with colorectal surgery.    FEN - soft VTE - SCDs, Lovenox  ID - Cefepime/Flagyl.   I reviewed nursing notes, hospitalist notes, last 24 h vitals and pain scores, last 48 h intake and output, last 24 h labs and trends, and last 24 h imaging results.    LOS: 3 days    Letha Cape , [facility] 11/18/2023, 10:23 AM Please see Amion for pager number during day hours 7:00am-4:30pm

## 2023-11-18 NOTE — Progress Notes (Signed)
PROGRESS NOTE  Henry Paul QIO:[id number] DOB: 01-05-1966 DOA: 11/15/2023 PCP: [name], MD  Brief History   The patient is a 58 yr old man who presented to [facility] with complaining of acute onset of sweats, chills, and generalilzed abdominal pain on 11/14/2023. The pain worsened on 11/15/2023 after he tried to go to work. He then went to the ED. He has had nausea, but no vomiting. The patient has a recent history with diveriticulitis. He was admitted to on 09/24/2023 with a perforated sigmoid diverticulitis. He was given IV Zosyn, but failed to improved. Repeat CT demonstrated a fluid collection in the RLQ. A percutaneous drain was placed by IR. He continued to improve on an oral coarse of augmentin with the drain in place. The drain was removed on 10/13/2023 after repeat CT demonstrated complete resolution of the abscess.   The patient carries a past medical history significant for BPH, GERD, Fatty liver, and a perforated sigmoid diverticulitis in 09/2023.   In the ED at [facility] the patient's was found to be tachycardic, but with otherwise normal vital signs. His labwork demonstrated a mild hyponatremia (132), WBC of 16.7, and a negative respiratory panel.   Surgery and IR were consulted by ED, blood cultures were obtained, and the patient received Rocephin, Flagyl, and a liter of LR. He was transferred to [facility] where he was admitted by Dr. Antionette Char on 11/15/2023. CT abdomen demonstrated an intra-abdominal fluid collection and a pneumoperitoneum. General surgery states that the evolving abscess appears to communicate with the sigmoid colon. They have recommended continuing IV antibiotics and observing him closely. He may require urgent surgery if it continues to worsen.   A & P  Intra-abdominal fluid-collection, possible evolving abscess  The patient is admitted to a medical bed. General surgery has been consulted. Their assistance is greatly appreciated. He is  receiving IV Cefepime and Flagyl. Blood cultures have been obtained, but are still pending. He is on a clear liquid diet. The patient states that he is feeling better.  Hyponatremia NA is 129 on 11/17/2023. Likely due to mild hypovolemia. The patient is receiving IV fluids. Monitor carefully.  Sepsis The patient presented with tachycardia, tachypnea, and leukocytosis of 16.7 in the setting of a known source of infection. He received emergent IV fluids and antibiotics.Resolved.  Uncontrolled hypertension The patient is also tachycardic. Will start metoprolo tartrate. Monitor for improvement.  I have seen and examined this patient myself. I have spent 34 minutes in his evaluation and care.  DVT prophylaxis: SCD's Code Status: Full Code Family Communication: None available Disposition Plan: tbd    Karole Oo, DO [facility] Direct contact: see [email or web address]  7PM-7AM contact night coverage as above 11/18/2023, 5:21 AM  LOS: 3 days   Consultants  General surgery Interventional radiology  Procedures  None  Antibiotics   Anti-infectives (From admission, onward)    Start     Dose/Rate Route Frequency Ordered Stop   11/16/23 0800  metroNIDAZOLE (FLAGYL) IVPB 500 mg        500 mg 100 mL/hr over 60 Minutes Intravenous 2 times daily 11/15/23 2328 11/23/23 0959   11/16/23 0200  ceFEPIme (MAXIPIME) 2 g in sodium chloride 0.9 % 100 mL IVPB        2 g 200 mL/hr over 30 Minutes Intravenous Every 8 hours 11/15/23 2341     11/15/23 1715  cefTRIAXone (ROCEPHIN) 2 g in sodium chloride 0.9 % 100 mL IVPB  2 g 200 mL/hr over 30 Minutes Intravenous Once 11/15/23 1709 11/15/23 2016   11/15/23 1715  metroNIDAZOLE (FLAGYL) IVPB 500 mg        500 mg 100 mL/hr over 60 Minutes Intravenous  Once 11/15/23 1709 11/15/23 2016        Interval History/Subjective  The patient is resting comfortably. He states that his pain is improved.  Objective   Vitals:  Vitals:   11/17/23 2125  11/18/23 0435  BP:  (!) 139/96  Pulse: 99 94  Resp:  18  Temp:  98.2 F (36.8 C)  SpO2:  97%    Exam:  Exam:  Constitutional:  The patient is awake, alert, and oriented x 3. No acute distress. Respiratory:  No increased work of breathing. No wheezes, rales, or rhonchi No tactile fremitus Cardiovascular:  Regular rate and rhythm No murmurs, ectopy, or gallups. No lateral PMI. No thrills. Abdomen:  Abdomen is soft and non-distended The abdomen is diffusely tender, especially so in the left lower quadrant No hernias, masses, or organomegaly Hypoactive bowel sounds.  Musculoskeletal:  No cyanosis, clubbing, or edema Skin:  No rashes, lesions, ulcers palpation of skin: no induration or nodules Neurologic:  CN 2-12 intact Sensation all 4 extremities intact Psychiatric:  Mental status Mood, affect appropriate Orientation to person, place, time  judgment and insight appear intact   I have personally reviewed the following:   Today's Data   Vitals:   11/17/23 2125 11/18/23 0435  BP:  (!) 139/96  Pulse: 99 94  Resp:  18  Temp:  98.2 F (36.8 C)  SpO2:  97%     Lab Data  CBC    Component Value Date/Time   WBC 12.1 (H) 11/18/2023 0417   RBC 4.49 11/18/2023 0417   HGB 13.6 11/18/2023 0417   HCT 39.5 11/18/2023 0417   PLT 283 11/18/2023 0417   MCV 88.0 11/18/2023 0417   MCH 30.3 11/18/2023 0417   MCHC 34.4 11/18/2023 0417   RDW 13.3 11/18/2023 0417   LYMPHSABS 1.8 09/30/2023 0543   MONOABS 0.9 09/30/2023 0543   EOSABS 0.4 09/30/2023 0543   BASOSABS 0.1 09/30/2023 0543      Latest Ref Rng & Units 11/17/2023    5:53 AM 11/16/2023    4:37 AM 11/15/2023    3:54 PM  BMP  Glucose 70 - 99 mg/dL 86  528  413   BUN 6 - 20 mg/dL 12  13  16    Creatinine 0.61 - 1.24 mg/dL 2.44  0.10  2.72   Sodium 135 - 145 mmol/L 129  133  132   Potassium 3.5 - 5.1 mmol/L 3.9  4.1  4.3   Chloride 98 - 111 mmol/L 98  103  98   CO2 22 - 32 mmol/L 19  22  24    Calcium 8.9 -  10.3 mg/dL 8.5  8.8  9.6      Micro Data   Results for orders placed or performed during the hospital encounter of 11/15/23  Blood culture (routine x 2)     Status: None (Preliminary result)   Collection Time: 11/15/23  5:09 PM   Specimen: BLOOD  Result Value Ref Range Status   Specimen Description   Final    BLOOD BLOOD LEFT WRIST Performed at [facility], [address] [place], [place], [place] [address]    Special Requests   Final    Blood Culture results may not be optimal due to an inadequate volume of blood  received in culture bottles BOTTLES DRAWN AEROBIC AND ANAEROBIC Performed at [facility], [address], [place], [place] [address]    Culture   Final    NO GROWTH 2 DAYS Performed at [facility], [address], [place], [place] [address]    Report Status PENDING  Incomplete  Resp panel by RT-PCR (RSV, Flu A&B, Covid)     Status: None   Collection Time: 11/15/23  5:09 PM   Specimen: Nasal Swab  Result Value Ref Range Status   SARS Coronavirus 2 by RT PCR NEGATIVE NEGATIVE Final    Comment: (NOTE) SARS-CoV-2 target nucleic acids are NOT DETECTED.  The SARS-CoV-2 RNA is generally detectable in upper respiratory specimens during the acute phase of infection. The lowest concentration of SARS-CoV-2 viral copies this assay can detect is 138 copies/mL. A negative result does not preclude SARS-Cov-2 infection and should not be used as the sole basis for treatment or other patient management decisions. A negative result may occur with  improper specimen collection/handling, submission of specimen other than nasopharyngeal swab, presence of viral mutation(s) within the areas targeted by this assay, and inadequate number of viral copies(<138 copies/mL). A negative result must be combined with clinical observations, patient history, and epidemiological information. The expected result is Negative.  Fact Sheet for Patients:   BloggerCourse.com  Fact Sheet for Healthcare Providers:  SeriousBroker.it  This test is no t yet approved or cleared by the [place] FDA and  has been authorized for detection and/or diagnosis of SARS-CoV-2 by FDA under an Emergency Use Authorization (EUA). This EUA will remain  in effect (meaning this test can be used) for the duration of the COVID-19 declaration under Section 564(b)(1) of the Act, 21 U.S.C.section 360bbb-3(b)(1), unless the authorization is terminated  or revoked sooner.       Influenza A by PCR NEGATIVE NEGATIVE Final   Influenza B by PCR NEGATIVE NEGATIVE Final    Comment: (NOTE) The Xpert Xpress SARS-CoV-2/FLU/RSV plus assay is intended as an aid in the diagnosis of influenza from Nasopharyngeal swab specimens and should not be used as a sole basis for treatment. Nasal washings and aspirates are unacceptable for Xpert Xpress SARS-CoV-2/FLU/RSV testing.  Fact Sheet for Patients: BloggerCourse.com  Fact Sheet for Healthcare Providers: SeriousBroker.it  This test is not yet approved or cleared by the [place] FDA and has been authorized for detection and/or diagnosis of SARS-CoV-2 by FDA under an Emergency Use Authorization (EUA). This EUA will remain in effect (meaning this test can be used) for the duration of the COVID-19 declaration under Section 564(b)(1) of the Act, 21 U.S.C. section 360bbb-3(b)(1), unless the authorization is terminated or revoked.     Resp Syncytial Virus by PCR NEGATIVE NEGATIVE Final    Comment: (NOTE) Fact Sheet for Patients: BloggerCourse.com  Fact Sheet for Healthcare Providers: SeriousBroker.it  This test is not yet approved or cleared by the [place] FDA and has been authorized for detection and/or diagnosis of SARS-CoV-2 by FDA under an Emergency Use  Authorization (EUA). This EUA will remain in effect (meaning this test can be used) for the duration of the COVID-19 declaration under Section 564(b)(1) of the Act, 21 U.S.C. section 360bbb-3(b)(1), unless the authorization is terminated or revoked.  Performed at [facility], [address] [place], [place] [address]   Blood culture (routine x 2)     Status: None (Preliminary result)   Collection Time: 11/15/23  5:14 PM   Specimen: BLOOD  Result  Value Ref Range Status   Specimen Description   Final    BLOOD BLOOD LEFT FOREARM Performed at [facility], [address] [place], [place] [address]    Special Requests   Final    Blood Culture results may not be optimal due to an inadequate volume of blood received in culture bottles BOTTLES DRAWN AEROBIC AND ANAEROBIC Performed at [facility], [address] [place], [place] [address]    Culture   Final    NO GROWTH 2 DAYS Performed at [facility], [address] [place], [place] [address]    Report Status PENDING  Incomplete     Imaging  CT abdomen and pelvis.  Scheduled Meds:  enoxaparin (LOVENOX) injection  40 mg Subcutaneous Q24H   loratadine  10 mg Oral Daily   pantoprazole (PROTONIX) IV  40 mg Intravenous Daily   Continuous Infusions:  ceFEPime (MAXIPIME) IV 2 g (11/18/23 0225)   metronidazole 500 mg (11/17/23 2134)    Principal Problem:   Intraabdominal fluid collection Active Problems:   Pneumoperitoneum   LOS: 3 days

## 2023-11-18 NOTE — Plan of Care (Signed)
A/Ox4 and on room air. Up with self care. 1 dose PRN dilaudid. Remains on IV antibiotics.    Problem: Education: Goal: Knowledge of General Education information will improve Description: Including pain rating scale, medication(s)/side effects and non-pharmacologic comfort measures Outcome: Progressing   Problem: Health Behavior/Discharge Planning: Goal: Ability to manage health-related needs will improve Outcome: Progressing   Problem: Clinical Measurements: Goal: Ability to maintain clinical measurements within normal limits will improve Outcome: Progressing Goal: Will remain free from infection Outcome: Progressing Goal: Diagnostic test results will improve Outcome: Progressing Goal: Respiratory complications will improve Outcome: Progressing Goal: Cardiovascular complication will be avoided Outcome: Progressing   Problem: Activity: Goal: Risk for activity intolerance will decrease Outcome: Progressing   Problem: Nutrition: Goal: Adequate nutrition will be maintained Outcome: Progressing   Problem: Coping: Goal: Level of anxiety will decrease Outcome: Progressing   Problem: Elimination: Goal: Will not experience complications related to bowel motility Outcome: Progressing Goal: Will not experience complications related to urinary retention Outcome: Progressing   Problem: Pain Management: Goal: General experience of comfort will improve Outcome: Progressing   Problem: Safety: Goal: Ability to remain free from injury will improve Outcome: Progressing   Problem: Skin Integrity: Goal: Risk for impaired skin integrity will decrease Outcome: Progressing   Problem: Fluid Volume: Goal: Hemodynamic stability will improve Outcome: Progressing   Problem: Clinical Measurements: Goal: Diagnostic test results will improve Outcome: Progressing Goal: Signs and symptoms of infection will decrease Outcome: Progressing   Problem: Respiratory: Goal: Ability to maintain  adequate ventilation will improve Outcome: Progressing   Problem: Education: Goal: Knowledge of General Education information will improve Description: Including pain rating scale, medication(s)/side effects and non-pharmacologic comfort measures Outcome: Progressing   Problem: Health Behavior/Discharge Planning: Goal: Ability to manage health-related needs will improve Outcome: Progressing   Problem: Clinical Measurements: Goal: Ability to maintain clinical measurements within normal limits will improve Outcome: Progressing Goal: Will remain free from infection Outcome: Progressing Goal: Diagnostic test results will improve Outcome: Progressing Goal: Respiratory complications will improve Outcome: Progressing Goal: Cardiovascular complication will be avoided Outcome: Progressing   Problem: Activity: Goal: Risk for activity intolerance will decrease Outcome: Progressing   Problem: Nutrition: Goal: Adequate nutrition will be maintained Outcome: Progressing   Problem: Coping: Goal: Level of anxiety will decrease Outcome: Progressing   Problem: Elimination: Goal: Will not experience complications related to bowel motility Outcome: Progressing Goal: Will not experience complications related to urinary retention Outcome: Progressing   Problem: Pain Management: Goal: General experience of comfort will improve Outcome: Progressing   Problem: Safety: Goal: Ability to remain free from injury will improve Outcome: Progressing   Problem: Skin Integrity: Goal: Risk for impaired skin integrity will decrease Outcome: Progressing   Problem: Fluid Volume: Goal: Hemodynamic stability will improve Outcome: Progressing   Problem: Clinical Measurements: Goal: Diagnostic test results will improve Outcome: Progressing Goal: Signs and symptoms of infection will decrease Outcome: Progressing   Problem: Respiratory: Goal: Ability to maintain adequate ventilation will  improve Outcome: Progressing

## 2023-11-18 NOTE — Plan of Care (Signed)

## 2023-11-19 DIAGNOSIS — R188 Other ascites: Secondary | ICD-10-CM | POA: Diagnosis not present

## 2023-11-19 LAB — CBC
HCT: 37.7 % — ABNORMAL LOW (ref 39.0–52.0)
Hemoglobin: 12.8 g/dL — ABNORMAL LOW (ref 13.0–17.0)
MCH: 29.8 pg (ref 26.0–34.0)
MCHC: 34 g/dL (ref 30.0–36.0)
MCV: 87.7 fL (ref 80.0–100.0)
Platelets: 282 10*3/uL (ref 150–400)
RBC: 4.3 MIL/uL (ref 4.22–5.81)
RDW: 13.6 % (ref 11.5–15.5)
WBC: 9.6 10*3/uL (ref 4.0–10.5)
nRBC: 0 % (ref 0.0–0.2)

## 2023-11-19 LAB — BASIC METABOLIC PANEL
Anion gap: 11 (ref 5–15)
BUN: 17 mg/dL (ref 6–20)
CO2: 21 mmol/L — ABNORMAL LOW (ref 22–32)
Calcium: 8.8 mg/dL — ABNORMAL LOW (ref 8.9–10.3)
Chloride: 103 mmol/L (ref 98–111)
Creatinine, Ser: 0.95 mg/dL (ref 0.61–1.24)
GFR, Estimated: >60 mL/min (ref >60)
Glucose, Bld: 101 mg/dL — ABNORMAL HIGH (ref 70–99)
Potassium: 4 mmol/L (ref 3.5–5.1)
Sodium: 135 mmol/L (ref 135–145)

## 2023-11-19 MED ORDER — AMOXICILLIN-POT CLAVULANATE 875-125 MG PO TABS: 1.0000 | ORAL_TABLET | Freq: Two times a day (BID) | ORAL | 0 refills | Status: AC

## 2023-11-19 MED ORDER — METOPROLOL TARTRATE 25 MG PO TABS: 25.0000 mg | ORAL_TABLET | Freq: Two times a day (BID) | ORAL | 1 refills | Status: AC

## 2023-11-19 NOTE — Progress Notes (Signed)
PROGRESS NOTE  Henry Paul IHK:[id number] DOB: [birth year] DOA: 11/15/2023 PCP: [name], MD  Brief History   The patient is a 58 yr old man who presented to [facility] with complaining of acute onset of sweats, chills, and generalilzed abdominal pain on 11/14/2023. The pain worsened on 11/15/2023 after he tried to go to work. He then went to the ED. He has had nausea, but no vomiting. The patient has a recent history with diveriticulitis. He was admitted to on 09/24/2023 with a perforated sigmoid diverticulitis. He was given IV Zosyn, but failed to improved. Repeat CT demonstrated a fluid collection in the RLQ. A percutaneous drain was placed by IR. He continued to improve on an oral coarse of augmentin with the drain in place. The drain was removed on 10/13/2023 after repeat CT demonstrated complete resolution of the abscess.   The patient carries a past medical history significant for BPH, GERD, Fatty liver, and a perforated sigmoid diverticulitis in 09/2023.   In the ED at [facility] the patient's was found to be tachycardic, but with otherwise normal vital signs. His labwork demonstrated a mild hyponatremia (132), WBC of 16.7, and a negative respiratory panel.   Surgery and IR were consulted by ED, blood cultures were obtained, and the patient received Rocephin, Flagyl, and a liter of LR. He was transferred to [facility] where he was admitted by Dr. Antionette Char on 11/15/2023. CT abdomen demonstrated an intra-abdominal fluid collection and a pneumoperitoneum. General surgery states that the evolving abscess appears to communicate with the sigmoid colon. They have recommended continuing IV antibiotics and observing him closely. He may require urgent surgery if it continues to worsen. His diet has been advanced today.  A & P  Intra-abdominal fluid-collection, possible evolving abscess  The patient is admitted to a medical bed. General surgery has been consulted. Their assistance  is greatly appreciated. He is receiving IV Cefepime and Flagyl. Blood cultures have been obtained, but are still pending. He has been advanced to a soft diet by surgery. The patient states that the pain is improved except for a sharp pain while moving about in bed yesterday.  Hyponatremia Improved. NA is 133 on 11/18/2023. Likely due to mild hypovolemia. The patient is receiving IV fluids. Monitor carefully.  Sepsis The patient presented with tachycardia, tachypnea, and leukocytosis of 16.7 in the setting of a known source of infection. He received emergent IV fluids and antibiotics.Resolved.  Uncontrolled hypertension The patient is also tachycardic. Will start metoprolo tartrate. Improved.  I have seen and examined this patient myself. I have spent 32 minutes in his evaluation and care.  DVT prophylaxis: SCD's Code Status: Full Code Family Communication: None available Disposition Plan: tbd    Tiffiny Worthy, DO [facility] Direct contact: see [email or web address]  7PM-7AM contact night coverage as above 11/18/2023 4:27 PM  LOS: 4 days   Consultants  General surgery Interventional radiology  Procedures  None  Antibiotics   Anti-infectives (From admission, onward)    Start     Dose/Rate Route Frequency Ordered Stop   11/16/23 0800  metroNIDAZOLE (FLAGYL) IVPB 500 mg        500 mg 100 mL/hr over 60 Minutes Intravenous 2 times daily 11/15/23 2328 11/23/23 0959   11/16/23 0200  ceFEPIme (MAXIPIME) 2 g in sodium chloride 0.9 % 100 mL IVPB        2 g 200 mL/hr over 30 Minutes Intravenous Every 8 hours 11/15/23 2341     11/15/23 1715  cefTRIAXone (  ROCEPHIN) 2 g in sodium chloride 0.9 % 100 mL IVPB        2 g 200 mL/hr over 30 Minutes Intravenous Once 11/15/23 1709 11/15/23 2016   11/15/23 1715  metroNIDAZOLE (FLAGYL) IVPB 500 mg        500 mg 100 mL/hr over 60 Minutes Intravenous  Once 11/15/23 1709 11/15/23 2016        Interval History/Subjective  The patient is resting  comfortably. He states that he is feeling better. Objective   Vitals:  Vitals:   11/18/23 2119 11/18/23 2120  BP:  (!) 125/95  Pulse:  97  Resp:  19  Temp: 99.2 F (37.3 C)   SpO2:  97%   Exam:  Constitutional:  The patient is awake, alert, and oriented x 3. No acute distress. Respiratory:  No increased work of breathing. No wheezes, rales, or rhonchi No tactile fremitus Cardiovascular:  Regular rate and rhythm No murmurs, ectopy, or gallups. No lateral PMI. No thrills. Abdomen:  Abdomen is soft and non-distended The abdomen is diffusely tender, especially so in the left lower quadrant No hernias, masses, or organomegaly Hypoactive bowel sounds.  Musculoskeletal:  No cyanosis, clubbing, or edema Skin:  No rashes, lesions, ulcers palpation of skin: no induration or nodules Neurologic:  CN 2-12 intact Sensation all 4 extremities intact Psychiatric:  Mental status Mood, affect appropriate Orientation to person, place, time  judgment and insight appear intact   I have personally reviewed the following:   Today's Data   Vitals:   11/18/23 2119 11/18/23 2120  BP:  (!) 125/95  Pulse:  97  Resp:  19  Temp: 99.2 F (37.3 C)   SpO2:  97%     Lab Data  CBC    Component Value Date/Time   WBC 12.1 (H) 11/18/2023 0417   RBC 4.49 11/18/2023 0417   HGB 13.6 11/18/2023 0417   HCT 39.5 11/18/2023 0417   PLT 283 11/18/2023 0417   MCV 88.0 11/18/2023 0417   MCH 30.3 11/18/2023 0417   MCHC 34.4 11/18/2023 0417   RDW 13.3 11/18/2023 0417   LYMPHSABS 1.8 09/30/2023 0543   MONOABS 0.9 09/30/2023 0543   EOSABS 0.4 09/30/2023 0543   BASOSABS 0.1 09/30/2023 0543      Latest Ref Rng & Units 11/18/2023    4:17 AM 11/17/2023    5:53 AM 11/16/2023    4:37 AM  BMP  Glucose 70 - 99 mg/dL 161  86  096   BUN 6 - 20 mg/dL 13  12  13    Creatinine 0.61 - 1.24 mg/dL 0.45  4.09  8.11   Sodium 135 - 145 mmol/L 133  129  133   Potassium 3.5 - 5.1 mmol/L 3.7  3.9  4.1    Chloride 98 - 111 mmol/L 100  98  103   CO2 22 - 32 mmol/L 21  19  22    Calcium 8.9 - 10.3 mg/dL 8.8  8.5  8.8      Micro Data   Results for orders placed or performed during the hospital encounter of 11/15/23  Blood culture (routine x 2)     Status: None (Preliminary result)   Collection Time: 11/15/23  5:09 PM   Specimen: BLOOD  Result Value Ref Range Status   Specimen Description   Final    BLOOD BLOOD LEFT WRIST Performed at [facility], [address], [place], [place] [address]    Special Requests   Final  Blood Culture results may not be optimal due to an inadequate volume of blood received in culture bottles BOTTLES DRAWN AEROBIC AND ANAEROBIC Performed at [facility], [address], [place], [place] [address]    Culture   Final    NO GROWTH 3 DAYS Performed at [facility], [address], [place], [place] [address]    Report Status PENDING  Incomplete  Resp panel by RT-PCR (RSV, Flu A&B, Covid)     Status: None   Collection Time: 11/15/23  5:09 PM   Specimen: Nasal Swab  Result Value Ref Range Status   SARS Coronavirus 2 by RT PCR NEGATIVE NEGATIVE Final    Comment: (NOTE) SARS-CoV-2 target nucleic acids are NOT DETECTED.  The SARS-CoV-2 RNA is generally detectable in upper respiratory specimens during the acute phase of infection. The lowest concentration of SARS-CoV-2 viral copies this assay can detect is 138 copies/mL. A negative result does not preclude SARS-Cov-2 infection and should not be used as the sole basis for treatment or other patient management decisions. A negative result may occur with  improper specimen collection/handling, submission of specimen other than nasopharyngeal swab, presence of viral mutation(s) within the areas targeted by this assay, and inadequate number of viral copies(<138 copies/mL). A negative result must be combined with clinical observations, patient history, and  epidemiological information. The expected result is Negative.  Fact Sheet for Patients:  BloggerCourse.com  Fact Sheet for Healthcare Providers:  SeriousBroker.it  This test is no t yet approved or cleared by the [place] FDA and  has been authorized for detection and/or diagnosis of SARS-CoV-2 by FDA under an Emergency Use Authorization (EUA). This EUA will remain  in effect (meaning this test can be used) for the duration of the COVID-19 declaration under Section 564(b)(1) of the Act, 21 U.S.C.section 360bbb-3(b)(1), unless the authorization is terminated  or revoked sooner.       Influenza A by PCR NEGATIVE NEGATIVE Final   Influenza B by PCR NEGATIVE NEGATIVE Final    Comment: (NOTE) The Xpert Xpress SARS-CoV-2/FLU/RSV plus assay is intended as an aid in the diagnosis of influenza from Nasopharyngeal swab specimens and should not be used as a sole basis for treatment. Nasal washings and aspirates are unacceptable for Xpert Xpress SARS-CoV-2/FLU/RSV testing.  Fact Sheet for Patients: BloggerCourse.com  Fact Sheet for Healthcare Providers: SeriousBroker.it  This test is not yet approved or cleared by the [place] FDA and has been authorized for detection and/or diagnosis of SARS-CoV-2 by FDA under an Emergency Use Authorization (EUA). This EUA will remain in effect (meaning this test can be used) for the duration of the COVID-19 declaration under Section 564(b)(1) of the Act, 21 U.S.C. section 360bbb-3(b)(1), unless the authorization is terminated or revoked.     Resp Syncytial Virus by PCR NEGATIVE NEGATIVE Final    Comment: (NOTE) Fact Sheet for Patients: BloggerCourse.com  Fact Sheet for Healthcare Providers: SeriousBroker.it  This test is not yet approved or cleared by the [place] FDA and has been  authorized for detection and/or diagnosis of SARS-CoV-2 by FDA under an Emergency Use Authorization (EUA). This EUA will remain in effect (meaning this test can be used) for the duration of the COVID-19 declaration under Section 564(b)(1) of the Act, 21 U.S.C. section 360bbb-3(b)(1), unless the authorization is terminated or revoked.  Performed at [facility], [address] [place], [place] [address]   Blood culture (routine x 2)     Status: None (Preliminary result)  Collection Time: 11/15/23  5:14 PM   Specimen: BLOOD  Result Value Ref Range Status   Specimen Description   Final    BLOOD BLOOD LEFT FOREARM Performed at [facility], [address] [place], [place] [address]    Special Requests   Final    Blood Culture results may not be optimal due to an inadequate volume of blood received in culture bottles BOTTLES DRAWN AEROBIC AND ANAEROBIC Performed at [facility], [address], [place], [place] [address]    Culture   Final    NO GROWTH 3 DAYS Performed at [facility], [address], [place], [place] [address]    Report Status PENDING  Incomplete     Imaging  CT abdomen and pelvis.  Scheduled Meds:  enoxaparin (LOVENOX) injection  40 mg Subcutaneous Q24H   loratadine  10 mg Oral Daily   metoprolol tartrate  25 mg Oral BID   pantoprazole (PROTONIX) IV  40 mg Intravenous Daily   Continuous Infusions:  ceFEPime (MAXIPIME) IV 2 g (11/19/23 0113)   metronidazole 500 mg (11/18/23 2058)    Principal Problem:   Intraabdominal fluid collection Active Problems:   Pneumoperitoneum   LOS: 4 days

## 2023-11-19 NOTE — Progress Notes (Signed)
Explained discharge instructions to patient. Reviewed follow up appointment and next medication administration times. Also reviewed education. Patient verbalized having an understanding for instructions given. All belongings are in the patient's possession. IVs were removed. CCMD was notified. No other needs verbalized. Transporting downstairs to the discharge lounge.

## 2023-11-19 NOTE — Discharge Summary (Signed)
Physician Discharge Summary  Henry Paul HQI:[id number] DOB: 09-27-1966 DOA: 11/15/2023  PCP: Henry Housekeeper, MD  Admit date: 11/15/2023  Discharge date: 11/19/2023  Admitted From:Home  Disposition:  Home  Recommendations for Outpatient Follow-up:  Follow up with PCP in 1-2 weeks Follow-up with GI and general surgery as scheduled for timing of colonoscopy Continue Augmentin as prescribed for 11 more days to complete total 14-day course of treatment Metoprolol 25 mg twice daily prescribed for blood pressure Continue other home medications as prior  Home Health: None  Equipment/Devices: None  Discharge Condition:Stable  CODE STATUS: Full  Diet recommendation: Heart Healthy  Brief/Interim Summary: The patient is a 58 yr old man who presented to [facility] with complaining of acute onset of sweats, chills, and generalilzed abdominal pain on 11/14/2023. The pain worsened on 11/15/2023 after he tried to go to work. He then went to the ED. He has had nausea, but no vomiting. The patient has a recent history with diveriticulitis. He was admitted to on 09/24/2023 with a perforated sigmoid diverticulitis. He was given IV Zosyn, but failed to improved. Repeat CT demonstrated a fluid collection in the RLQ. A percutaneous drain was placed by IR. He continued to improve on an oral coarse of augmentin with the drain in place. The drain was removed on 10/13/2023 after repeat CT demonstrated complete resolution of the abscess.   Patient was admitted for concern of possible evolving abscess with intra-abdominal fluid collection and was seen by general surgery and interventional radiology.  Fortunately no inpatient procedures were necessary and he was treated with conservative management with IV antibiotics.  He is now feeling better and diet has been advanced and denies any further abdominal pain.  He has been seen by general surgery and appears stable for discharge and will have follow-up  arranged outpatient.  He will remain on a total 14-day course of antibiotics with Augmentin as prescribed above.  Discharge Diagnoses:  Principal Problem:   Intraabdominal fluid collection Active Problems:   Pneumoperitoneum  Principal discharge diagnosis: Right lower quadrant inflammatory process with associated perforation with questionable diverticulitis versus sigmoid diverticulitis.  Discharge Instructions  Discharge Instructions     Diet - low sodium heart healthy   Complete by: As directed    Increase activity slowly   Complete by: As directed       Allergies as of 11/19/2023   No Known Allergies      Medication List     TAKE these medications    acetaminophen 500 MG tablet Commonly known as: TYLENOL Take 1,000 mg by mouth every 6 (six) hours as needed for mild pain (pain score 1-3) or moderate pain (pain score 4-6).   amoxicillin-clavulanate 875-125 MG tablet Commonly known as: AUGMENTIN Take 1 tablet by mouth 2 (two) times daily for 11 days.   cetirizine 10 MG tablet Commonly known as: ZYRTEC Take 10 mg by mouth daily.   docusate sodium 100 MG capsule Commonly known as: COLACE Take 100 mg by mouth daily.   metoprolol tartrate 25 MG tablet Commonly known as: LOPRESSOR Take 1 tablet (25 mg total) by mouth 2 (two) times daily.   pantoprazole 20 MG tablet Commonly known as: PROTONIX Take 20 mg by mouth every evening.   tamsulosin 0.4 MG Caps capsule Commonly known as: FLOMAX Take 0.4 mg by mouth every evening.        Follow-up Information     [facility], [place]. Schedule an appointment as soon as possible for a visit  in 6 week(s).   Specialty: General Surgery Contact information: [address] [place] [address] [phone]        Henry Elizabeth, MD. Call.   Specialty: Gastroenterology Why: To discuss timing of colonoscopy Contact information: [address] [place]  [address] [phone]         Henry Housekeeper, MD. Schedule an appointment as soon as possible for a visit in 4 week(s).   Specialty: Internal Medicine Contact information: [address] [place] [address] [phone]                No Known Allergies  Consultations: General surgery   Procedures/Studies: CT ABDOMEN PELVIS W CONTRAST Result Date: 11/15/2023 CLINICAL DATA:  Abdominal pain. EXAM: CT ABDOMEN AND PELVIS WITH CONTRAST TECHNIQUE: Multidetector CT imaging of the abdomen and pelvis was performed using the standard protocol following bolus administration of intravenous contrast. RADIATION DOSE REDUCTION: This exam was performed according to the departmental dose-optimization program which includes automated exposure control, adjustment of the mA and/or kV according to patient size and/or use of iterative reconstruction technique. CONTRAST:  OMNIPAQUE IOHEXOL 300 MG/ML  SOLN COMPARISON:  October 13, 2023 FINDINGS: Lower chest: No acute abnormality. Hepatobiliary: No focal liver abnormality is seen. Status post cholecystectomy. No biliary dilatation. Pancreas: Unremarkable. No pancreatic ductal dilatation or surrounding inflammatory changes. Spleen: Normal in size without focal abnormality. Adrenals/Urinary Tract: Adrenal glands are unremarkable. Kidneys are normal in size, without renal calculi or hydronephrosis. Multiple stable bilateral simple renal cysts are seen. Bladder is unremarkable. Stomach/Bowel: Stomach is within normal limits. The appendix is surgically absent. A short segment dilated small bowel is seen within the anterior aspect of the mid left abdomen (maximum small bowel diameter of approximately 3.1 cm). A transition zone is seen within the region anterior to the left kidney (axial CT images 34 through 39, CT series 2). Noninflamed diverticula are seen throughout the sigmoid colon. The right lower quadrant percutaneous drainage catheter seen  on the prior study has been removed. A residual 2.2 cm x 2.7 cm x 2.3 cm extraluminal collection of fluid and air is seen within this region (axial CT image 60, CT series 2/coronal reformatted image 56, CT series 6). A mild to moderate amount of free air is seen within the mid to upper abdomen. Mild mesenteric inflammatory fat stranding is also seen within the lower abdomen and pelvis, right greater than left. Mildly inflamed small bowel loops are also noted within this region. Vascular/Lymphatic: Aortic atherosclerosis. No enlarged abdominal or pelvic lymph nodes. Reproductive: There is mild to moderate severity prostate gland enlargement. Other: No abdominal wall hernia or abnormality. No abdominopelvic ascites. Musculoskeletal: No acute or significant osseous findings. IMPRESSION: 1. Short segment of dilated proximal small bowel which may be transient in nature. Sequelae associated with an early partial small bowel obstruction cannot be excluded. 2. Mild to moderate amount of free air within the mid to upper abdomen which represents a new finding when compared to the prior study and likely represents sequelae associated with perforation associated with the inflammatory process seen within the right lower quadrant. 3. Interval removal of the right lower quadrant percutaneous drainage catheter with a residual 2.2 cm x 2.7 cm x 2.3 cm extraluminal collection of fluid and air within this region. 4. Mild enteritis involving loops of mid and distal ileum. 5. Sigmoid diverticulosis. 6. Multiple stable bilateral simple renal cysts. No follow-up imaging is recommended. This recommendation follows  ACR consensus guidelines: Management of the Incidental Renal Mass on CT: A White Paper of the ACR Incidental Findings Committee. J Am Coll Radiol [phone]. 7. Aortic atherosclerosis. Aortic Atherosclerosis (ICD10-I70.0). Electronically Signed   By: Aram Candela M.D.   On: 11/15/2023 19:40     Discharge  Exam: Vitals:   11/19/23 0515 11/19/23 0745  BP: 134/87 (!) 119/92  Pulse: 83 90  Resp: 19 16  Temp:  97.8 F (36.6 C)  SpO2: 97% 97%   Vitals:   11/18/23 2120 11/19/23 0500 11/19/23 0515 11/19/23 0745  BP: (!) 125/95  134/87 (!) 119/92  Pulse: 97  83 90  Resp: 19  19 16   Temp:  97.8 F (36.6 C)  97.8 F (36.6 C)  TempSrc:  Oral  Oral  SpO2: 97%  97% 97%  Weight:      Height:        General: Pt is alert, awake, not in acute distress Cardiovascular: RRR, S1/S2 +, no rubs, no gallops Respiratory: CTA bilaterally, no wheezing, no rhonchi Abdominal: Soft, NT, ND, bowel sounds + Extremities: no edema, no cyanosis    The results of significant diagnostics from this hospitalization (including imaging, microbiology, ancillary and laboratory) are listed below for reference.     Microbiology: Recent Results (from the past 240 hours)  Blood culture (routine x 2)     Status: None (Preliminary result)   Collection Time: 11/15/23  5:09 PM   Specimen: BLOOD  Result Value Ref Range Status   Specimen Description   Final    BLOOD BLOOD LEFT WRIST Performed at [facility], [address] [place], [place] [address]    Special Requests   Final    Blood Culture results may not be optimal due to an inadequate volume of blood received in culture bottles BOTTLES DRAWN AEROBIC AND ANAEROBIC Performed at [facility], [address] [place], [place] [address]    Culture   Final    NO GROWTH 4 DAYS Performed at [facility], [address], [place], [place] [address]    Report Status PENDING  Incomplete  Resp panel by RT-PCR (RSV, Flu A&B, Covid)     Status: None   Collection Time: 11/15/23  5:09 PM   Specimen: Nasal Swab  Result Value Ref Range Status   SARS Coronavirus 2 by RT PCR NEGATIVE NEGATIVE Final    Comment: (NOTE) SARS-CoV-2 target nucleic acids are NOT DETECTED.  The SARS-CoV-2 RNA is generally detectable in upper  respiratory specimens during the acute phase of infection. The lowest concentration of SARS-CoV-2 viral copies this assay can detect is 138 copies/mL. A negative result does not preclude SARS-Cov-2 infection and should not be used as the sole basis for treatment or other patient management decisions. A negative result may occur with  improper specimen collection/handling, submission of specimen other than nasopharyngeal swab, presence of viral mutation(s) within the areas targeted by this assay, and inadequate number of viral copies(<138 copies/mL). A negative result must be combined with clinical observations, patient history, and epidemiological information. The expected result is Negative.  Fact Sheet for Patients:  BloggerCourse.com  Fact Sheet for Healthcare Providers:  SeriousBroker.it  This test is no t yet approved or cleared by the [place] FDA and  has been authorized for detection and/or diagnosis of SARS-CoV-2 by FDA under an Emergency Use Authorization (EUA). This EUA will remain  in effect (meaning this test can be used) for the duration of the COVID-19 declaration  under Section 564(b)(1) of the Act, 21 U.S.C.section 360bbb-3(b)(1), unless the authorization is terminated  or revoked sooner.       Influenza A by PCR NEGATIVE NEGATIVE Final   Influenza B by PCR NEGATIVE NEGATIVE Final    Comment: (NOTE) The Xpert Xpress SARS-CoV-2/FLU/RSV plus assay is intended as an aid in the diagnosis of influenza from Nasopharyngeal swab specimens and should not be used as a sole basis for treatment. Nasal washings and aspirates are unacceptable for Xpert Xpress SARS-CoV-2/FLU/RSV testing.  Fact Sheet for Patients: BloggerCourse.com  Fact Sheet for Healthcare Providers: SeriousBroker.it  This test is not yet approved or cleared by the [place] FDA and has been  authorized for detection and/or diagnosis of SARS-CoV-2 by FDA under an Emergency Use Authorization (EUA). This EUA will remain in effect (meaning this test can be used) for the duration of the COVID-19 declaration under Section 564(b)(1) of the Act, 21 U.S.C. section 360bbb-3(b)(1), unless the authorization is terminated or revoked.     Resp Syncytial Virus by PCR NEGATIVE NEGATIVE Final    Comment: (NOTE) Fact Sheet for Patients: BloggerCourse.com  Fact Sheet for Healthcare Providers: SeriousBroker.it  This test is not yet approved or cleared by the [place] FDA and has been authorized for detection and/or diagnosis of SARS-CoV-2 by FDA under an Emergency Use Authorization (EUA). This EUA will remain in effect (meaning this test can be used) for the duration of the COVID-19 declaration under Section 564(b)(1) of the Act, 21 U.S.C. section 360bbb-3(b)(1), unless the authorization is terminated or revoked.  Performed at [facility], [address] [place], [place] [address]   Blood culture (routine x 2)     Status: None (Preliminary result)   Collection Time: 11/15/23  5:14 PM   Specimen: BLOOD  Result Value Ref Range Status   Specimen Description   Final    BLOOD BLOOD LEFT FOREARM Performed at [facility], [address], [place], [place] [address]    Special Requests   Final    Blood Culture results may not be optimal due to an inadequate volume of blood received in culture bottles BOTTLES DRAWN AEROBIC AND ANAEROBIC Performed at [facility], [address], [place], [place] [address]    Culture   Final    NO GROWTH 4 DAYS Performed at [facility], [address], [place], [place] [address]    Report Status PENDING  Incomplete     Labs: BNP (last 3 results) No results for input(s): "BNP" in the last 8760 hours. Basic Metabolic Panel: Recent  Labs  Lab 11/15/23 1554 11/16/23 0437 11/17/23 0553 11/18/23 0417 11/19/23 0458  NA 132* 133* 129* 133* 135  K 4.3 4.1 3.9 3.7 4.0  CL 98 103 98 100 103  CO2 24 22 19* 21* 21*  GLUCOSE 112* 105* 86 111* 101*  BUN 16 13 12 13 17   CREATININE 1.14 1.05 0.95 1.03 0.95  CALCIUM 9.6 8.8* 8.5* 8.8* 8.8*   Liver Function Tests: Recent Labs  Lab 11/15/23 1554 11/16/23 0437  AST 15 17  ALT 26 22  ALKPHOS 50 48  BILITOT 1.4* 1.4*  PROT 7.6 6.5  ALBUMIN 4.7 3.5   Recent Labs  Lab 11/15/23 1554  LIPASE 20   No results for input(s): "AMMONIA" in the last 168 hours. CBC: Recent Labs  Lab 11/15/23 1554 11/16/23 0437 11/17/23 0553 11/18/23 0417 11/19/23 0458  WBC 16.7* 13.7* 11.6* 12.1* 9.6  HGB 14.5 14.2 13.5 13.6  12.8*  HCT 42.8 42.5 40.0 39.5 37.7*  MCV 86.8 90.0 88.7 88.0 87.7  PLT 293 238 228 283 282   Cardiac Enzymes: No results for input(s): "CKTOTAL", "CKMB", "CKMBINDEX", "TROPONINI" in the last 168 hours. BNP: Invalid input(s): "POCBNP" CBG: No results for input(s): "GLUCAP" in the last 168 hours. D-Dimer No results for input(s): "DDIMER" in the last 72 hours. Hgb A1c No results for input(s): "HGBA1C" in the last 72 hours. Lipid Profile No results for input(s): "CHOL", "HDL", "LDLCALC", "TRIG", "CHOLHDL", "LDLDIRECT" in the last 72 hours. Thyroid function studies No results for input(s): "TSH", "T4TOTAL", "T3FREE", "THYROIDAB" in the last 72 hours.  Invalid input(s): "FREET3" Anemia work up No results for input(s): "VITAMINB12", "FOLATE", "FERRITIN", "TIBC", "IRON", "RETICCTPCT" in the last 72 hours. Urinalysis    Component Value Date/Time   COLORURINE YELLOW 11/15/2023 1554   APPEARANCEUR CLEAR 11/15/2023 1554   LABSPEC 1.043 (H) 11/15/2023 1554   PHURINE 6.5 11/15/2023 1554   GLUCOSEU NEGATIVE 11/15/2023 1554   HGBUR TRACE (A) 11/15/2023 1554   BILIRUBINUR NEGATIVE 11/15/2023 1554   KETONESUR NEGATIVE 11/15/2023 1554   PROTEINUR TRACE (A)  11/15/2023 1554   NITRITE NEGATIVE 11/15/2023 1554   LEUKOCYTESUR NEGATIVE 11/15/2023 1554   Sepsis Labs Recent Labs  Lab 11/16/23 0437 11/17/23 0553 11/18/23 0417 11/19/23 0458  WBC 13.7* 11.6* 12.1* 9.6   Microbiology Recent Results (from the past 240 hours)  Blood culture (routine x 2)     Status: None (Preliminary result)   Collection Time: 11/15/23  5:09 PM   Specimen: BLOOD  Result Value Ref Range Status   Specimen Description   Final    BLOOD BLOOD LEFT WRIST Performed at [facility], [address], [place], [place] [address]    Special Requests   Final    Blood Culture results may not be optimal due to an inadequate volume of blood received in culture bottles BOTTLES DRAWN AEROBIC AND ANAEROBIC Performed at [facility], [address] [place], [place] [address]    Culture   Final    NO GROWTH 4 DAYS Performed at [facility], [address], [place], [place] [address]    Report Status PENDING  Incomplete  Resp panel by RT-PCR (RSV, Flu A&B, Covid)     Status: None   Collection Time: 11/15/23  5:09 PM   Specimen: Nasal Swab  Result Value Ref Range Status   SARS Coronavirus 2 by RT PCR NEGATIVE NEGATIVE Final    Comment: (NOTE) SARS-CoV-2 target nucleic acids are NOT DETECTED.  The SARS-CoV-2 RNA is generally detectable in upper respiratory specimens during the acute phase of infection. The lowest concentration of SARS-CoV-2 viral copies this assay can detect is 138 copies/mL. A negative result does not preclude SARS-Cov-2 infection and should not be used as the sole basis for treatment or other patient management decisions. A negative result may occur with  improper specimen collection/handling, submission of specimen other than nasopharyngeal swab, presence of viral mutation(s) within the areas targeted by this assay, and inadequate number of viral copies(<138 copies/mL). A negative result must be combined  with clinical observations, patient history, and epidemiological information. The expected result is Negative.  Fact Sheet for Patients:  BloggerCourse.com  Fact Sheet for Healthcare Providers:  SeriousBroker.it  This test is no t yet approved or cleared by the [place] FDA and  has been authorized for detection and/or diagnosis of SARS-CoV-2 by FDA under an Emergency Use Authorization (EUA). This EUA will remain  in effect (meaning this test can be used) for the duration of the COVID-19 declaration under Section 564(b)(1) of the Act, 21 U.S.C.section 360bbb-3(b)(1), unless the authorization is terminated  or revoked sooner.       Influenza A by PCR NEGATIVE NEGATIVE Final   Influenza B by PCR NEGATIVE NEGATIVE Final    Comment: (NOTE) The Xpert Xpress SARS-CoV-2/FLU/RSV plus assay is intended as an aid in the diagnosis of influenza from Nasopharyngeal swab specimens and should not be used as a sole basis for treatment. Nasal washings and aspirates are unacceptable for Xpert Xpress SARS-CoV-2/FLU/RSV testing.  Fact Sheet for Patients: BloggerCourse.com  Fact Sheet for Healthcare Providers: SeriousBroker.it  This test is not yet approved or cleared by the [place] FDA and has been authorized for detection and/or diagnosis of SARS-CoV-2 by FDA under an Emergency Use Authorization (EUA). This EUA will remain in effect (meaning this test can be used) for the duration of the COVID-19 declaration under Section 564(b)(1) of the Act, 21 U.S.C. section 360bbb-3(b)(1), unless the authorization is terminated or revoked.     Resp Syncytial Virus by PCR NEGATIVE NEGATIVE Final    Comment: (NOTE) Fact Sheet for Patients: BloggerCourse.com  Fact Sheet for Healthcare Providers: SeriousBroker.it  This test is not yet approved  or cleared by the [place] FDA and has been authorized for detection and/or diagnosis of SARS-CoV-2 by FDA under an Emergency Use Authorization (EUA). This EUA will remain in effect (meaning this test can be used) for the duration of the COVID-19 declaration under Section 564(b)(1) of the Act, 21 U.S.C. section 360bbb-3(b)(1), unless the authorization is terminated or revoked.  Performed at [facility], [address], [place], [place] [address]   Blood culture (routine x 2)     Status: None (Preliminary result)   Collection Time: 11/15/23  5:14 PM   Specimen: BLOOD  Result Value Ref Range Status   Specimen Description   Final    BLOOD BLOOD LEFT FOREARM Performed at [facility], [address], [place], [place] [address]    Special Requests   Final    Blood Culture results may not be optimal due to an inadequate volume of blood received in culture bottles BOTTLES DRAWN AEROBIC AND ANAEROBIC Performed at [facility], [address], [place], [place] [address]    Culture   Final    NO GROWTH 4 DAYS Performed at [facility], [address], [place], [place] [address]    Report Status PENDING  Incomplete     Time coordinating discharge: 35 minutes  SIGNED:   Erick Blinks, DO [facility] 11/19/2023, 11:04 AM  If 7PM-7AM, please contact night-coverage [email or web address]

## 2023-11-19 NOTE — Progress Notes (Signed)
Subjective: Abd pain ~resolved. No n/v. Feeling quite well. Doesn't like the omelet they made this morning  Objective: Vital signs in last 24 hours: Temp:  [97.8 F (36.6 C)-99.2 F (37.3 C)] 97.8 F (36.6 C) (01/17 0745) Pulse Rate:  [83-99] 90 (01/17 0745) Resp:  [16-19] 16 (01/17 0745) BP: (119-134)/(87-95) 119/92 (01/17 0745) SpO2:  [97 %-98 %] 97 % (01/17 0745) Last BM Date : 11/18/23  Intake/Output from previous day: No intake/output data recorded. Intake/Output this shift: No intake/output data recorded.  PE: Gen:  Alert, NAD, pleasant Abd: Soft, ND, not significantly tender  Lab Results:  Recent Labs    11/18/23 0417 11/19/23 0458  WBC 12.1* 9.6  HGB 13.6 12.8*  HCT 39.5 37.7*  PLT 283 282   BMET Recent Labs    11/18/23 0417 11/19/23 0458  NA 133* 135  K 3.7 4.0  CL 100 103  CO2 21* 21*  GLUCOSE 111* 101*  BUN 13 17  CREATININE 1.03 0.95  CALCIUM 8.8* 8.8*   PT/INR No results for input(s): "LABPROT", "INR" in the last 72 hours.  CMP     Component Value Date/Time   NA 135 11/19/2023 0458   K 4.0 11/19/2023 0458   CL 103 11/19/2023 0458   CO2 21 (L) 11/19/2023 0458   GLUCOSE 101 (H) 11/19/2023 0458   BUN 17 11/19/2023 0458   CREATININE 0.95 11/19/2023 0458   CALCIUM 8.8 (L) 11/19/2023 0458   PROT 6.5 11/16/2023 0437   ALBUMIN 3.5 11/16/2023 0437   AST 17 11/16/2023 0437   ALT 22 11/16/2023 0437   ALKPHOS 48 11/16/2023 0437   BILITOT 1.4 (H) 11/16/2023 0437   GFRNONAA >60 11/19/2023 0458   GFRAA >60 01/17/2018 0451   Lipase     Component Value Date/Time   LIPASE 20 11/15/2023 1554    Studies/Results: No results found.   Anti-infectives: Anti-infectives (From admission, onward)    Start     Dose/Rate Route Frequency Ordered Stop   11/16/23 0800  metroNIDAZOLE (FLAGYL) IVPB 500 mg        500 mg 100 mL/hr over 60 Minutes Intravenous 2 times daily 11/15/23 2328 11/23/23 0959   11/16/23 0200  ceFEPIme (MAXIPIME) 2 g in  sodium chloride 0.9 % 100 mL IVPB        2 g 200 mL/hr over 30 Minutes Intravenous Every 8 hours 11/15/23 2341     11/15/23 1715  cefTRIAXone (ROCEPHIN) 2 g in sodium chloride 0.9 % 100 mL IVPB        2 g 200 mL/hr over 30 Minutes Intravenous Once 11/15/23 1709 11/15/23 2016   11/15/23 1715  metroNIDAZOLE (FLAGYL) IVPB 500 mg        500 mg 100 mL/hr over 60 Minutes Intravenous  Once 11/15/23 1709 11/15/23 2016        Assessment/Plan RLQ inflammatory process with associated perforation, ?R-sided diverticulitis vs sigmoid diverticulitis   - Patient has hx of admission in November at [facility] for diverticulitis. He ultimately developed an abscess and required an IR perc drain to be placed. This was removed in December.  - CT demonstrates small amount of free air but no significant free fluid. There is a phlegmon/evolving possible abscess in his lower abdomen that it does appear to potentially abut or even communicate with sigmoid colon. Nothing obviously drainable at this time.  - adv to soft diet - normalized.  AF.  Pain resolved  - He is currently scheduled for  one in February. After this he will need follow up with colorectal surgery.    FEN - soft VTE - SCDs, Lovenox  ID - Cefepime/Flagyl.  Can transition to Augmentin. Would complete 14 day course  From surgical perspective he is stable for discharge home   I reviewed nursing notes, hospitalist notes, last 24 h vitals and pain scores, last 48 h intake and output, last 24 h labs and trends, and last 24 h imaging results.  I spent a total of 35 minutes in both face-to-face and non-face-to-face activities, excluding procedures performed, for this visit on the date of this encounter.     LOS: 4 days    Marin Olp, MD [facility], [facility]

## 2023-11-19 NOTE — Progress Notes (Signed)
   11/19/23 1114  TOC Brief Assessment  Insurance and Status Reviewed  Patient has primary care physician Yes  Home environment has been reviewed home  Prior level of function: self/independent  Prior/Current Home Services No current home services  Social Drivers of Health Review SDOH reviewed no interventions necessary  Readmission risk has been reviewed Yes  Transition of care needs no transition of care needs at this time   Pt stable for transition home, no HH or DME needs noted. Wife to transport home

## 2023-11-20 LAB — CULTURE, BLOOD (ROUTINE X 2)
Culture: NO GROWTH
Culture: NO GROWTH

## 2023-11-26 ENCOUNTER — Other Ambulatory Visit: Payer: Self-pay | Admitting: Internal Medicine

## 2023-11-26 DIAGNOSIS — R188 Other ascites: Secondary | ICD-10-CM

## 2023-11-30 ENCOUNTER — Ambulatory Visit
Admission: RE | Admit: 2023-11-30 | Discharge: 2023-11-30 | Disposition: A | Discharge status: Home / Self Care | Payer: 59 | Source: Ambulatory Visit | Attending: Internal Medicine | Admitting: Internal Medicine

## 2023-11-30 DIAGNOSIS — R188 Other ascites: Secondary | ICD-10-CM

## 2023-11-30 MED ORDER — IOPAMIDOL (ISOVUE-300) INJECTION 61%
100.0000 mL | Freq: Once | INTRAVENOUS | Status: AC | PRN
  Administered 2023-11-30: 100 mL via INTRAVENOUS

## 2023-12-27 ENCOUNTER — Other Ambulatory Visit: Payer: Self-pay

## 2023-12-27 ENCOUNTER — Emergency Department (HOSPITAL_COMMUNITY): Payer: 59

## 2023-12-27 ENCOUNTER — Inpatient Hospital Stay (HOSPITAL_COMMUNITY)
Admission: EM | Admit: 2023-12-27 | Discharge: 2023-12-29 | DRG: 391 | Disposition: A | Discharge status: Home / Self Care | Payer: 59 | Attending: Internal Medicine | Admitting: Internal Medicine

## 2023-12-27 ENCOUNTER — Encounter (HOSPITAL_COMMUNITY): Payer: Self-pay | Admitting: Emergency Medicine

## 2023-12-27 DIAGNOSIS — B999 Unspecified infectious disease: Secondary | ICD-10-CM | POA: Diagnosis present

## 2023-12-27 DIAGNOSIS — N400 Enlarged prostate without lower urinary tract symptoms: Secondary | ICD-10-CM | POA: Diagnosis present

## 2023-12-27 DIAGNOSIS — D72829 Elevated white blood cell count, unspecified: Secondary | ICD-10-CM

## 2023-12-27 DIAGNOSIS — Z79899 Other long term (current) drug therapy: Secondary | ICD-10-CM | POA: Diagnosis not present

## 2023-12-27 DIAGNOSIS — L03311 Cellulitis of abdominal wall: Secondary | ICD-10-CM | POA: Diagnosis present

## 2023-12-27 DIAGNOSIS — K760 Fatty (change of) liver, not elsewhere classified: Secondary | ICD-10-CM | POA: Diagnosis present

## 2023-12-27 DIAGNOSIS — K632 Fistula of intestine: Secondary | ICD-10-CM

## 2023-12-27 DIAGNOSIS — K651 Peritoneal abscess: Principal | ICD-10-CM

## 2023-12-27 DIAGNOSIS — K219 Gastro-esophageal reflux disease without esophagitis: Secondary | ICD-10-CM | POA: Diagnosis present

## 2023-12-27 DIAGNOSIS — I10 Essential (primary) hypertension: Secondary | ICD-10-CM | POA: Diagnosis present

## 2023-12-27 DIAGNOSIS — K5720 Diverticulitis of large intestine with perforation and abscess without bleeding: Principal | ICD-10-CM | POA: Diagnosis present

## 2023-12-27 LAB — COMPREHENSIVE METABOLIC PANEL
ALT: 26 U/L (ref 0–44)
AST: 21 U/L (ref 15–41)
Albumin: 4.2 g/dL (ref 3.5–5.0)
Alkaline Phosphatase: 57 U/L (ref 38–126)
Anion gap: 13 (ref 5–15)
BUN: 17 mg/dL (ref 6–20)
CO2: 22 mmol/L (ref 22–32)
Calcium: 9.9 mg/dL (ref 8.9–10.3)
Chloride: 105 mmol/L (ref 98–111)
Creatinine, Ser: 1.23 mg/dL (ref 0.61–1.24)
GFR, Estimated: >60 mL/min (ref >60)
Glucose, Bld: 113 mg/dL — ABNORMAL HIGH (ref 70–99)
Potassium: 4.1 mmol/L (ref 3.5–5.1)
Sodium: 140 mmol/L (ref 135–145)
Total Bilirubin: 0.8 mg/dL (ref 0.0–1.2)
Total Protein: 7.7 g/dL (ref 6.5–8.1)

## 2023-12-27 LAB — CBC
HCT: 44.1 % (ref 39.0–52.0)
Hemoglobin: 14.4 g/dL (ref 13.0–17.0)
MCH: 29.2 pg (ref 26.0–34.0)
MCHC: 32.7 g/dL (ref 30.0–36.0)
MCV: 89.5 fL (ref 80.0–100.0)
Platelets: 312 10*3/uL (ref 150–400)
RBC: 4.93 MIL/uL (ref 4.22–5.81)
RDW: 14.2 % (ref 11.5–15.5)
WBC: 12.6 10*3/uL — ABNORMAL HIGH (ref 4.0–10.5)
nRBC: 0 % (ref 0.0–0.2)

## 2023-12-27 LAB — URINALYSIS, ROUTINE W REFLEX MICROSCOPIC
Bilirubin Urine: NEGATIVE
Glucose, UA: NEGATIVE mg/dL
Hgb urine dipstick: NEGATIVE
Ketones, ur: NEGATIVE mg/dL
Leukocytes,Ua: NEGATIVE
Nitrite: NEGATIVE
Protein, ur: NEGATIVE mg/dL
Specific Gravity, Urine: 1.02 (ref 1.005–1.030)
pH: 6 (ref 5.0–8.0)

## 2023-12-27 LAB — LIPASE, BLOOD: Lipase: 33 U/L (ref 11–51)

## 2023-12-27 LAB — LACTIC ACID, PLASMA: Lactic Acid, Venous: 1.9 mmol/L (ref 0.5–1.9)

## 2023-12-27 MED ORDER — VANCOMYCIN HCL IN DEXTROSE 1-5 GM/200ML-% IV SOLN: 1000.0000 mg | Freq: Once | INTRAVENOUS | Status: DC

## 2023-12-27 MED ORDER — PIPERACILLIN-TAZOBACTAM 3.375 G IVPB
3.3750 g | Freq: Three times a day (TID) | INTRAVENOUS | Status: DC
  Administered 2023-12-27 – 2023-12-29 (×5): 3.375 g via INTRAVENOUS
  Filled 2023-12-27 (×5): qty 50

## 2023-12-27 MED ORDER — HYDROCODONE-ACETAMINOPHEN 5-325 MG PO TABS: 1.0000 | ORAL_TABLET | Freq: Four times a day (QID) | ORAL | Status: DC | PRN

## 2023-12-27 MED ORDER — LACTATED RINGERS IV SOLN: INTRAVENOUS | Status: DC

## 2023-12-27 MED ORDER — ONDANSETRON HCL 4 MG/2ML IJ SOLN
4.0000 mg | Freq: Once | INTRAMUSCULAR | Status: AC
  Administered 2023-12-27: 4 mg via INTRAVENOUS
  Filled 2023-12-27: qty 2

## 2023-12-27 MED ORDER — LORATADINE 10 MG PO TABS
10.0000 mg | ORAL_TABLET | Freq: Every day | ORAL | Status: DC
  Administered 2023-12-28 – 2023-12-29 (×2): 10 mg via ORAL
  Filled 2023-12-27 (×2): qty 1

## 2023-12-27 MED ORDER — METOPROLOL TARTRATE 25 MG PO TABS
25.0000 mg | ORAL_TABLET | Freq: Two times a day (BID) | ORAL | Status: DC
Start: 2023-12-27 — End: 2023-12-29
  Administered 2023-12-28 – 2023-12-29 (×3): 25 mg via ORAL
  Filled 2023-12-27 (×3): qty 1

## 2023-12-27 MED ORDER — ONDANSETRON HCL 4 MG/2ML IJ SOLN
4.0000 mg | Freq: Four times a day (QID) | INTRAMUSCULAR | Status: DC | PRN
  Administered 2023-12-29: 4 mg via INTRAVENOUS
  Filled 2023-12-27: qty 2

## 2023-12-27 MED ORDER — ENOXAPARIN SODIUM 40 MG/0.4ML IJ SOSY
40.0000 mg | PREFILLED_SYRINGE | INTRAMUSCULAR | Status: DC
  Administered 2023-12-27 – 2023-12-28 (×2): 40 mg via SUBCUTANEOUS
  Filled 2023-12-27 (×2): qty 0.4

## 2023-12-27 MED ORDER — LACTATED RINGERS IV BOLUS
1000.0000 mL | Freq: Once | INTRAVENOUS | Status: AC
  Administered 2023-12-27: 1000 mL via INTRAVENOUS

## 2023-12-27 MED ORDER — PANTOPRAZOLE SODIUM 20 MG PO TBEC
20.0000 mg | DELAYED_RELEASE_TABLET | Freq: Every day | ORAL | Status: DC
  Administered 2023-12-28: 20 mg via ORAL
  Filled 2023-12-27: qty 1

## 2023-12-27 MED ORDER — HYDROMORPHONE HCL 1 MG/ML IJ SOLN
0.5000 mg | Freq: Once | INTRAMUSCULAR | Status: AC
  Administered 2023-12-27: 0.5 mg via INTRAVENOUS
  Filled 2023-12-27: qty 1

## 2023-12-27 MED ORDER — HYDROMORPHONE HCL 1 MG/ML IJ SOLN
0.5000 mg | Freq: Four times a day (QID) | INTRAMUSCULAR | Status: DC | PRN
  Administered 2023-12-27 – 2023-12-29 (×4): 0.5 mg via INTRAVENOUS
  Filled 2023-12-27 (×2): qty 0.5
  Filled 2023-12-27: qty 1
  Filled 2023-12-27: qty 0.5

## 2023-12-27 MED ORDER — VANCOMYCIN HCL 1750 MG/350ML IV SOLN
1750.0000 mg | Freq: Once | INTRAVENOUS | Status: AC
  Administered 2023-12-27: 1750 mg via INTRAVENOUS
  Filled 2023-12-27 (×2): qty 350

## 2023-12-27 MED ORDER — ACETAMINOPHEN 325 MG PO TABS: 650.0000 mg | ORAL_TABLET | ORAL | Status: DC | PRN

## 2023-12-27 MED ORDER — IOHEXOL 300 MG/ML  SOLN
100.0000 mL | Freq: Once | INTRAMUSCULAR | Status: AC | PRN
  Administered 2023-12-27: 100 mL via INTRAVENOUS

## 2023-12-27 MED ORDER — PIPERACILLIN-TAZOBACTAM 3.375 G IVPB 30 MIN
3.3750 g | Freq: Once | INTRAVENOUS | Status: AC
  Administered 2023-12-27: 3.375 g via INTRAVENOUS
  Filled 2023-12-27: qty 50

## 2023-12-27 MED ORDER — VANCOMYCIN HCL 1750 MG/350ML IV SOLN
1750.0000 mg | INTRAVENOUS | Status: DC
  Administered 2023-12-28: 1750 mg via INTRAVENOUS
  Filled 2023-12-27: qty 350

## 2023-12-27 MED ORDER — TAMSULOSIN HCL 0.4 MG PO CAPS
0.4000 mg | ORAL_CAPSULE | Freq: Every day | ORAL | Status: DC
  Administered 2023-12-28: 0.4 mg via ORAL
  Filled 2023-12-27: qty 1

## 2023-12-27 NOTE — ED Provider Notes (Addendum)
 [place] [facility] AT [facility] Provider Note   CSN: [id number] Arrival date & time: 12/27/23  4098     History  Chief Complaint  Patient presents with   Abdominal Pain    Henry Paul is a 58 y.o. male.  Patient with hx diverticulitis w abscess, s/p prior IR drain and subsequent removal a couple months ago, and subsequent known air collection rlq, presents with new/increased pain to RLQ in past couple days. Dull, mild pain, non radiating. No vomiting. Having normal bms. No fever or chills.   The history is provided by the patient and medical records.  Abdominal Pain Associated symptoms: no chest pain, no chills, no cough, no diarrhea, no dysuria, no fever, no hematuria, no shortness of breath, no sore throat and no vomiting        Home Medications Prior to Admission medications   Medication Sig Start Date End Date Taking? Authorizing Provider  acetaminophen (TYLENOL) 500 MG tablet Take 1,000 mg by mouth every 6 (six) hours as needed for mild pain (pain score 1-3) or moderate pain (pain score 4-6).    [provider]  cetirizine (ZYRTEC) 10 MG tablet Take 10 mg by mouth daily.    [provider]  docusate sodium (COLACE) 100 MG capsule Take 100 mg by mouth daily.    [provider]  metoprolol tartrate (LOPRESSOR) 25 MG tablet Take 1 tablet (25 mg total) by mouth 2 (two) times daily. 11/19/23   Sherryll Burger, Pratik D, DO  pantoprazole (PROTONIX) 20 MG tablet Take 20 mg by mouth every evening. 04/02/21   [provider]  tamsulosin (FLOMAX) 0.4 MG CAPS capsule Take 0.4 mg by mouth every evening. 03/24/21   [provider]      Allergies    Patient has no known allergies.    Review of Systems   Review of Systems  Constitutional:  Negative for chills and fever.  HENT:  Negative for sore throat.   Respiratory:  Negative for cough and shortness of breath.   Cardiovascular:  Negative for chest pain.   Gastrointestinal:  Positive for abdominal pain. Negative for diarrhea and vomiting.  Genitourinary:  Negative for dysuria, flank pain and hematuria.  Musculoskeletal:  Negative for back pain.  Skin:  Negative for rash.  Neurological:  Negative for headaches.    Physical Exam Updated Vital Signs BP (!) 129/100   Pulse (!) 104   Temp 98.3 F (36.8 C) (Oral)   Resp 18   Wt 77.1 kg   SpO2 97%   BMI 25.10 kg/m  Physical Exam Vitals and nursing note reviewed.  Constitutional:      Appearance: Normal appearance. He is well-developed.  HENT:     Head: Atraumatic.     Nose: Nose normal.     Mouth/Throat:     Mouth: Mucous membranes are moist.  Eyes:     General: No scleral icterus.    Conjunctiva/sclera: Conjunctivae normal.  Neck:     Trachea: No tracheal deviation.  Cardiovascular:     Rate and Rhythm: Normal rate and regular rhythm.     Pulses: Normal pulses.     Heart sounds: Normal heart sounds. No murmur heard.    No friction rub. No gallop.  Pulmonary:     Effort: Pulmonary effort is normal. No accessory muscle usage or respiratory distress.     Breath sounds: Normal breath sounds.  Abdominal:     General: Bowel sounds are normal.  Palpations: Abdomen is soft.     Tenderness: There is abdominal tenderness. There is no guarding.     Comments: Mild tenderness, and area mild/soft distension to RLQ/abd wall. No incarcerated hernia.   Genitourinary:    Comments: No cva tenderness. Musculoskeletal:        General: No swelling or tenderness.     Cervical back: Normal range of motion and neck supple. No rigidity.  Skin:    General: Skin is warm and dry.     Findings: No rash.  Neurological:     Mental Status: He is alert.     Comments: Alert, speech clear.   Psychiatric:        Mood and Affect: Mood normal.     ED Results / Procedures / Treatments   Labs (all labs ordered are listed, but only abnormal results are displayed) Results for orders placed or  performed during the hospital encounter of 12/27/23  Lipase, blood   Collection Time: 12/27/23  6:47 AM  Result Value Ref Range   Lipase 33 11 - 51 U/L  Comprehensive metabolic panel   Collection Time: 12/27/23  6:47 AM  Result Value Ref Range   Sodium 140 135 - 145 mmol/L   Potassium 4.1 3.5 - 5.1 mmol/L   Chloride 105 98 - 111 mmol/L   CO2 22 22 - 32 mmol/L   Glucose, Bld 113 (H) 70 - 99 mg/dL   BUN 17 6 - 20 mg/dL   Creatinine, Ser 5.40 0.61 - 1.24 mg/dL   Calcium 9.9 8.9 - 98.1 mg/dL   Total Protein 7.7 6.5 - 8.1 g/dL   Albumin 4.2 3.5 - 5.0 g/dL   AST 21 15 - 41 U/L   ALT 26 0 - 44 U/L   Alkaline Phosphatase 57 38 - 126 U/L   Total Bilirubin 0.8 0.0 - 1.2 mg/dL   GFR, Estimated >19 >14 mL/min   Anion gap 13 5 - 15  CBC   Collection Time: 12/27/23  6:47 AM  Result Value Ref Range   WBC 12.6 (H) 4.0 - 10.5 K/uL   RBC 4.93 4.22 - 5.81 MIL/uL   Hemoglobin 14.4 13.0 - 17.0 g/dL   HCT 78.2 95.6 - 21.3 %   MCV 89.5 80.0 - 100.0 fL   MCH 29.2 26.0 - 34.0 pg   MCHC 32.7 30.0 - 36.0 g/dL   RDW 08.6 57.8 - 46.9 %   Platelets 312 150 - 400 K/uL   nRBC 0.0 0.0 - 0.2 %  Urinalysis, Routine w reflex microscopic -Urine, Clean Catch   Collection Time: 12/27/23  9:58 AM  Result Value Ref Range   Color, Urine YELLOW YELLOW   APPearance CLEAR CLEAR   Specific Gravity, Urine 1.020 1.005 - 1.030   pH 6.0 5.0 - 8.0   Glucose, UA NEGATIVE NEGATIVE mg/dL   Hgb urine dipstick NEGATIVE NEGATIVE   Bilirubin Urine NEGATIVE NEGATIVE   Ketones, ur NEGATIVE NEGATIVE mg/dL   Protein, ur NEGATIVE NEGATIVE mg/dL   Nitrite NEGATIVE NEGATIVE   Leukocytes,Ua NEGATIVE NEGATIVE   CT ABDOMEN PELVIS W CONTRAST Result Date: 12/27/2023 CLINICAL DATA:  Abdominal pain, history of prior right lower quadrant diverticular abscess status post drainage EXAM: CT ABDOMEN AND PELVIS WITH CONTRAST TECHNIQUE: Multidetector CT imaging of the abdomen and pelvis was performed using the standard protocol following  bolus administration of intravenous contrast. RADIATION DOSE REDUCTION: This exam was performed according to the departmental dose-optimization program which includes automated exposure control, adjustment of the  mA and/or kV according to patient size and/or use of iterative reconstruction technique. CONTRAST:  OMNIPAQUE IOHEXOL 300 MG/ML  SOLN COMPARISON:  11/30/2023 FINDINGS: Lower chest: No acute pleural or parenchymal lung disease. Hepatobiliary: Hepatic steatosis. Prior cholecystectomy. No biliary duct dilation. Pancreas: Unremarkable. No pancreatic ductal dilatation or surrounding inflammatory changes. Spleen: Normal in size without focal abnormality. Adrenals/Urinary Tract: Stable bilateral renal cortical cysts do not require specific imaging follow-up. No urinary tract calculi or obstructive uropathy. The bladder is decompressed, limiting its evaluation. The adrenals are unremarkable. Stomach/Bowel: The extraluminal gas/fluid collection within the right lower quadrant is again identified. This appears to connect to both the cecum and the sigmoid colon, best seen on image 62/8. The intraperitoneal portion of the gas/fluid collection measures 2.8 x 2.6 cm reference image 65/2, with overall decrease in size since prior study but increased wall thickening. Once again, there is extension of the gas/fluid collection through the right lower quadrant abdominal wall into the subcutaneous fat. This collection now measures 4.6 x 5.4 cm, with minimal fluid component. There is increasing adjacent subcutaneous fat stranding within the right lower quadrant abdominal wall and extending into the right flank. No bowel obstruction or ileus. Diverticulosis of the sigmoid colon again noted. Decreased bowel wall thickening within the sigmoid colon compatible with resolving diverticulitis. Vascular/Lymphatic: No significant vascular findings are present. No enlarged abdominal or pelvic lymph nodes. Reproductive: Mild  enlargement the prostate unchanged. Other: Mesenteric fat stranding within the lower abdomen and pelvis. No free fluid or free intraperitoneal gas. Musculoskeletal: No acute or destructive bony abnormalities. Reconstructed images demonstrate no additional findings. IMPRESSION: 1. Complex gas/fluid collections within the right lower quadrant of the abdomen as well as the right lower quadrant anterior abdominal wall, with overall progression since prior study. These collections appear to arise from fistulous connections with the sigmoid colon and cecum. Surgical consultation is again recommended. 2. Increasing fat stranding within the right lower quadrant abdominal wall extending to the right flank, which could reflect developing cellulitis. 3. Persistent sigmoid diverticulosis, with improving sigmoid colon wall thickening compatible with resolving diverticulitis. 4. Hepatic steatosis. Electronically Signed   By: Sharlet Salina M.D.   On: 12/27/2023 15:08   CT ABDOMEN PELVIS W CONTRAST Result Date: 11/30/2023 CLINICAL DATA:  History of prior percutaneous catheter drainage of right lower quadrant diverticular abscess last November with removal of the drain on 10/13/2023. Recent hospitalization for recurrent abscess which did not require additional drainage procedure and was treated with antibiotics. EXAM: CT ABDOMEN AND PELVIS WITH CONTRAST TECHNIQUE: Multidetector CT imaging of the abdomen and pelvis was performed using the standard protocol following bolus administration of intravenous contrast. RADIATION DOSE REDUCTION: This exam was performed according to the departmental dose-optimization program which includes automated exposure control, adjustment of the mA and/or kV according to patient size and/or use of iterative reconstruction technique. CONTRAST:  ISOVUE-300 IOPAMIDOL (ISOVUE-300) INJECTION 61% COMPARISON:  11/15/2023 as well as other prior studies. FINDINGS: Lower chest: No acute abnormality.  Hepatobiliary: No focal liver abnormality is seen. Status post cholecystectomy. No biliary dilatation. Pancreas: Unremarkable. No pancreatic ductal dilatation or surrounding inflammatory changes. Spleen: Normal in size without focal abnormality. Adrenals/Urinary Tract: Adrenal glands are unremarkable. Kidneys are normal, without renal calculi, focal lesion, or hydronephrosis. Stable bilateral Bosniak 1 simple cysts of both kidneys require no follow-up. Bladder is unremarkable. Stomach/Bowel: The loculations of free intraperitoneal air seen on the prior study have resolved. In the right lower quadrant, the complex air and fluid collection seen  previously now appears to be almost entirely a region of extraluminal air with a dominant pocket abutting the abdominal wall measuring approximately 4 cm in maximum diameter. There also is new herniation of air through the abdominal wall extending just into the subcutaneous fat outside of the rectus musculature measuring 3.3 cm in diameter. Exact source of this air is again somewhat difficult to determine and may either be from the cecal tip, distal small bowel or sigmoid colon. There is stable wall thickening of the sigmoid colon. Inflammation in the pelvic mesenteric fat appears slightly improved compared to the prior study with some persistent inflammation remaining. The appendix is been removed. Vascular/Lymphatic: No significant vascular findings are present. No enlarged abdominal or pelvic lymph nodes. Reproductive: Prostate is unremarkable. Other: Mild inflammatory changes in the right lower abdominal wall subcutaneous fat. Musculoskeletal: No acute or significant osseous findings. IMPRESSION: 1. The loculations of free intraperitoneal air seen on the prior study have resolved. 2. The complex air and fluid collection seen previously in the right lower quadrant now appears to be almost entirely a region of extraluminal air with a dominant pocket abutting the abdominal  wall measuring approximately 4 cm in maximum diameter. There also is new herniation of air through the abdominal wall extending just into the subcutaneous fat outside of the rectus musculature measuring 3.3 cm in diameter. Exact source of this air is again somewhat difficult to determine and may either be from the cecal tip, distal small bowel or sigmoid colon. Surgical follow-up is recommended. 3. Stable wall thickening of the sigmoid colon. Inflammation in the pelvic mesenteric fat appears slightly improved compared to the prior study with some persistent inflammation remaining. 4. Stable bilateral Bosniak 1 simple cysts of both kidneys require no follow-up. Electronically Signed   By: Irish Lack M.D.   On: 11/30/2023 16:27     EKG None  Radiology CT ABDOMEN PELVIS W CONTRAST Result Date: 12/27/2023 CLINICAL DATA:  Abdominal pain, history of prior right lower quadrant diverticular abscess status post drainage EXAM: CT ABDOMEN AND PELVIS WITH CONTRAST TECHNIQUE: Multidetector CT imaging of the abdomen and pelvis was performed using the standard protocol following bolus administration of intravenous contrast. RADIATION DOSE REDUCTION: This exam was performed according to the departmental dose-optimization program which includes automated exposure control, adjustment of the mA and/or kV according to patient size and/or use of iterative reconstruction technique. CONTRAST:  OMNIPAQUE IOHEXOL 300 MG/ML  SOLN COMPARISON:  11/30/2023 FINDINGS: Lower chest: No acute pleural or parenchymal lung disease. Hepatobiliary: Hepatic steatosis. Prior cholecystectomy. No biliary duct dilation. Pancreas: Unremarkable. No pancreatic ductal dilatation or surrounding inflammatory changes. Spleen: Normal in size without focal abnormality. Adrenals/Urinary Tract: Stable bilateral renal cortical cysts do not require specific imaging follow-up. No urinary tract calculi or obstructive uropathy. The bladder is  decompressed, limiting its evaluation. The adrenals are unremarkable. Stomach/Bowel: The extraluminal gas/fluid collection within the right lower quadrant is again identified. This appears to connect to both the cecum and the sigmoid colon, best seen on image 62/8. The intraperitoneal portion of the gas/fluid collection measures 2.8 x 2.6 cm reference image 65/2, with overall decrease in size since prior study but increased wall thickening. Once again, there is extension of the gas/fluid collection through the right lower quadrant abdominal wall into the subcutaneous fat. This collection now measures 4.6 x 5.4 cm, with minimal fluid component. There is increasing adjacent subcutaneous fat stranding within the right lower quadrant abdominal wall and extending into the right flank. No bowel  obstruction or ileus. Diverticulosis of the sigmoid colon again noted. Decreased bowel wall thickening within the sigmoid colon compatible with resolving diverticulitis. Vascular/Lymphatic: No significant vascular findings are present. No enlarged abdominal or pelvic lymph nodes. Reproductive: Mild enlargement the prostate unchanged. Other: Mesenteric fat stranding within the lower abdomen and pelvis. No free fluid or free intraperitoneal gas. Musculoskeletal: No acute or destructive bony abnormalities. Reconstructed images demonstrate no additional findings. IMPRESSION: 1. Complex gas/fluid collections within the right lower quadrant of the abdomen as well as the right lower quadrant anterior abdominal wall, with overall progression since prior study. These collections appear to arise from fistulous connections with the sigmoid colon and cecum. Surgical consultation is again recommended. 2. Increasing fat stranding within the right lower quadrant abdominal wall extending to the right flank, which could reflect developing cellulitis. 3. Persistent sigmoid diverticulosis, with improving sigmoid colon wall thickening compatible with  resolving diverticulitis. 4. Hepatic steatosis. Electronically Signed   By: Sharlet Salina M.D.   On: 12/27/2023 15:08    Procedures Procedures    Medications Ordered in ED Medications  piperacillin-tazobactam (ZOSYN) IVPB 3.375 g (has no administration in time range)  HYDROmorphone (DILAUDID) injection 0.5 mg (has no administration in time range)  ondansetron (ZOFRAN) injection 4 mg (has no administration in time range)  lactated ringers bolus 1,000 mL (has no administration in time range)  iohexol (OMNIPAQUE) 300 MG/ML solution 100 mL (100 mLs Intravenous Contrast Given 12/27/23 1318)    ED Course/ Medical Decision Making/ A&P                                 Medical Decision Making Problems Addressed: Abdominal fistula: acute illness or injury    Details: Acute on chronic Intra-abdominal abscess [facility]): acute illness or injury with systemic symptoms that poses a threat to life or bodily functions Leukocytosis, unspecified type: acute illness or injury  Amount and/or Complexity of Data Reviewed External Data Reviewed: labs, radiology and notes. Labs: ordered. Decision-making details documented in ED Course. Radiology: ordered and independent interpretation performed. Decision-making details documented in ED Course. Discussion of management or test interpretation with external provider(s): Surgery, medicine  Risk Prescription drug management. Parenteral controlled substances. Decision regarding hospitalization.   Iv ns. Continuous pulse ox and cardiac monitoring. Labs ordered/sent. Imaging ordered.   Differential diagnosis includes recurrent diverticular abscess, fistula, etc. Dispo decision including potential need for admission considered - will get labs and imaging and reassess.   Reviewed nursing notes and prior charts for additional history. External reports reviewed.   Cardiac monitor: sinus rhythm, rate 90.  Labs reviewed/interpreted by me - wbc 12, mildly  elevated. Hgb normal. Ua neg. Chem unremarkable.   CT reviewed/interpreted by me - increased air/fluid collection rlq.   Zosyn iv. Dilaudid iv. Zofran iv. Lr Boluses.  Gen surgery consulted. Discussed pt with Dr Carolynne Edouard - requests medicine admission, they will see in consult.   Hospitalists consulted for admission.   CRITICAL CARE RE: worsening intra-abdominal abscess, fistula Performed by: Suzi Roots Total critical care time: 40 minutes Critical care time was exclusive of separately billable procedures and treating other patients. Critical care was necessary to treat or prevent imminent or life-threatening deterioration. Critical care was time spent personally by me on the following activities: development of treatment plan with patient and/or surrogate as well as nursing, discussions with consultants, evaluation of patient's response to treatment, examination of patient, obtaining history from patient or  surrogate, ordering and performing treatments and interventions, ordering and review of laboratory studies, ordering and review of radiographic studies, pulse oximetry and re-evaluation of patient's condition.          Final Clinical Impression(s) / ED Diagnoses Final diagnoses:  None    Rx / DC Orders ED Discharge Orders     None           Cathren Laine, MD 12/27/23 [phone]

## 2023-12-27 NOTE — H&P (Signed)
 History and Physical    Patient: Henry Paul ZOX:[id number] DOB: 1966/04/15 DOA: 12/27/2023 DOS: the patient was seen and examined on 12/27/2023 PCP: [name], MD  Patient coming from: Home  Chief Complaint:  Chief Complaint  Patient presents with   Abdominal Pain   HPI: CONWAY FEDORA is a 58 y.o. male with medical history significant of sigmoid diverticulitis and abscess underwent percutaneous catheter drainage of the right lower quadrant peritoneal abscess on 09/29/2023 discharged from the hospital 1129 and completed the course of antibiotics and ultimately the drain  was removed on 10/13/2023, readmitted on 1/13 for nausea and abdominal pain, discharged on oral antibiotics on 11/19/2023, now presents with some lower quadrant abdominal pain and an abnormal CT scan of the abdomen and pelvis.  He underwent a repeat CT scan today  showing Complex gas/fluid collections within the right lower quadrant of the abdomen as well as the right lower quadrant anterior abdominal wall, with overall progression since prior study. These collections appear to arise from fistulous connections with the sigmoid colon and cecum. showing some enlargement of the abscess in the right lower quadrant. Patient denies any chills, fever, sob, cough, chest pain, nausea, vomiting. He reports loose bowel movements and intermittent lower quadrant abdominal pain. He denies any hematochezia.   ER work up:  He was afebrile, tachycardic, blood pressures of 130/95. Labs were remarkable for leukocytosis of 12,600.  Urine analysis negative  CT of the abdomen pelvis done showing Complex gas/fluid collections within the right lower quadrant of the abdomen as well as the right lower quadrant anterior abdominal wall, with overall progression since prior study. These collections appear to arise from fistulous connections with the sigmoid colon and cecum. showing some enlargement of the abscess in the right lower quadrant.     Increasing fat stranding within the right lower quadrant abdominal wall extending to the right flank, which could reflect developing cellulitis.  He was referred to [facility] for admission for RLQ intraabdominal abscess.  General surgery consulted.  IR consulted.    Review of Systems: As mentioned in the history of present illness. All other systems reviewed and are negative. Past Medical History:  Diagnosis Date   Fatty liver    GERD (gastroesophageal reflux disease)    Past Surgical History:  Procedure Laterality Date   APPENDECTOMY     CHOLECYSTECTOMY N/A 04/17/2021   Procedure: LAPAROSCOPIC CHOLECYSTECTOMY;  Surgeon: Abigail Miyamoto, MD;  Location: [facility];  Service: General;  Laterality: N/A;   COLONOSCOPY     FINGER SURGERY Left    ring and middle fingers reatttachment after amputation   IR RADIOLOGIST EVAL & MGMT  10/13/2023   WISDOM TOOTH EXTRACTION     Social History:  reports that he has never smoked. He has never used smokeless tobacco. He reports that he does not drink alcohol and does not use drugs.  No Known Allergies  History reviewed. No pertinent family history.  Prior to Admission medications   Medication Sig Start Date End Date Taking? Authorizing Provider  acetaminophen (TYLENOL) 500 MG tablet Take 1,000 mg by mouth every 6 (six) hours as needed for mild pain (pain score 1-3) or moderate pain (pain score 4-6).   Yes [provider]  cetirizine (ZYRTEC) 10 MG tablet Take 10 mg by mouth at bedtime.   Yes [provider]  docusate sodium (COLACE) 100 MG capsule Take 100 mg by mouth See admin instructions. Take 100 mg by mouth once a day and an additional 100  mg up to two times daily as needed for constipation   Yes [provider]  metoprolol tartrate (LOPRESSOR) 25 MG tablet Take 1 tablet (25 mg total) by mouth 2 (two) times daily. 11/19/23  Yes Shah, Pratik D, DO  pantoprazole (PROTONIX) 20 MG tablet Take 20 mg by mouth at bedtime. 04/02/21   Yes [provider]  tamsulosin (FLOMAX) 0.4 MG CAPS capsule Take 0.4 mg by mouth at bedtime. 03/24/21  Yes [provider]  TYLENOL PM EXTRA STRENGTH 500-25 MG TABS tablet Take 2 tablets by mouth at bedtime.   Yes [provider]    Physical Exam: Vitals:   12/27/23 1400 12/27/23 1500 12/27/23 1554 12/27/23 1630  BP: (!) 125/91 (!) 129/100  (!) 130/95  Pulse: 93 (!) 104  (!) 105  Resp: 18 18  16   Temp:   98.1 F (36.7 C)   TempSrc:   Oral   SpO2: 97% 97%  95%  Weight:       General exam: Appears calm and comfortable  Respiratory system: Clear to auscultation. Respiratory effort normal. Cardiovascular system: S1 & S2 heard, RRR. No JVD,  Gastrointestinal system: Abdomen is soft, mild tenderness in the RLQ , no signs of peritonitis.  Central nervous system: Alert and oriented. No focal neurological deficits. Extremities: Symmetric 5 x 5 power. Skin: erythematous area on the RLQ,  Psychiatry: Judgement and insight appear normal. Mood & affect appropriate.   Data Reviewed: Results for orders placed or performed during the hospital encounter of 12/27/23 (from the past 24 hours)  Lipase, blood     Status: None   Collection Time: 12/27/23  6:47 AM  Result Value Ref Range   Lipase 33 11 - 51 U/L  Comprehensive metabolic panel     Status: Abnormal   Collection Time: 12/27/23  6:47 AM  Result Value Ref Range   Sodium 140 135 - 145 mmol/L   Potassium 4.1 3.5 - 5.1 mmol/L   Chloride 105 98 - 111 mmol/L   CO2 22 22 - 32 mmol/L   Glucose, Bld 113 (H) 70 - 99 mg/dL   BUN 17 6 - 20 mg/dL   Creatinine, Ser 4.09 0.61 - 1.24 mg/dL   Calcium 9.9 8.9 - 81.1 mg/dL   Total Protein 7.7 6.5 - 8.1 g/dL   Albumin 4.2 3.5 - 5.0 g/dL   AST 21 15 - 41 U/L   ALT 26 0 - 44 U/L   Alkaline Phosphatase 57 38 - 126 U/L   Total Bilirubin 0.8 0.0 - 1.2 mg/dL   GFR, Estimated >91 >47 mL/min   Anion gap 13 5 - 15  CBC     Status: Abnormal   Collection Time: 12/27/23  6:47 AM   Result Value Ref Range   WBC 12.6 (H) 4.0 - 10.5 K/uL   RBC 4.93 4.22 - 5.81 MIL/uL   Hemoglobin 14.4 13.0 - 17.0 g/dL   HCT 82.9 56.2 - 13.0 %   MCV 89.5 80.0 - 100.0 fL   MCH 29.2 26.0 - 34.0 pg   MCHC 32.7 30.0 - 36.0 g/dL   RDW 86.5 78.4 - 69.6 %   Platelets 312 150 - 400 K/uL   nRBC 0.0 0.0 - 0.2 %  Urinalysis, Routine w reflex microscopic -Urine, Clean Catch     Status: None   Collection Time: 12/27/23  9:58 AM  Result Value Ref Range   Color, Urine YELLOW YELLOW   APPearance CLEAR CLEAR   Specific Gravity, Urine  1.020 1.005 - 1.030   pH 6.0 5.0 - 8.0   Glucose, UA NEGATIVE NEGATIVE mg/dL   Hgb urine dipstick NEGATIVE NEGATIVE   Bilirubin Urine NEGATIVE NEGATIVE   Ketones, ur NEGATIVE NEGATIVE mg/dL   Protein, ur NEGATIVE NEGATIVE mg/dL   Nitrite NEGATIVE NEGATIVE   Leukocytes,Ua NEGATIVE NEGATIVE    Assessment and Plan:  Intraabdominal abscess with developing surrounding cellulitis:  Start the patient on IV Vancomycin and IV Zosyn, and IV fluids.  Npo for now. Start patient on IV fluids.  General surgery consulted.  IR consulted for perc drain placement.  Pt with leukocytosis.    GERD stable on PPI.    Cellulitis of the RLQ Abdominal wall:  Pt started on IV vancomycin.  Continue to monitor.    Hypertension;  BP parameters optimal.  Resume metoprolol.      Advance Care Planning:   Code Status: Full Code   Consults: General surgery IR   Family Communication: none at bedside.   Severity of Illness: The appropriate patient status for this patient is INPATIENT. Inpatient status is judged to be reasonable and necessary in order to provide the required intensity of service to ensure the patient's safety. The patient's presenting symptoms, physical exam findings, and initial radiographic and laboratory data in the context of their chronic comorbidities is felt to place them at high risk for further clinical deterioration. Furthermore, it is not anticipated  that the patient will be medically stable for discharge from the hospital within 2 midnights of admission.   * I certify that at the point of admission it is my clinical judgment that the patient will require inpatient hospital care spanning beyond 2 midnights from the point of admission due to high intensity of service, high risk for further deterioration and high frequency of surveillance required.*  Author: Kathlen Mody, MD 12/27/2023 5:00 PM  For on call review [email or web address].

## 2023-12-27 NOTE — Progress Notes (Signed)
 Pharmacy Antibiotic Note  Henry Paul is a 58 y.o. male admitted on 12/27/2023 with  enlargement of the abscess in the right lower quadrant.  .  Pharmacy has been consulted for Vanco, Zosyn dosing.  Active Problem(s): enlargement of the abscess in the right lower quadrant.   PMH: fatty liver, GERD, diverticulitis with abscess (09/2022)  Significant events: initially presented back in November with a contained perforation of the sigmoid colon. He was treated with a percutaneous drain and had significant improvement. He was sent home with the drain on oral antibiotics. follow-up CT scan recently that showed some enlargement of the abscess in the right lower quadrant. He was told if he had any increase in his tenderness that he should come to the emergency department so he came this morning. 2/24  ID: RLQ abscess. No previous micro data  - Afebrile, WBC 12.6, Scr 1.23  Zosyn 2/24>> Vanco 2/24>>   Plan: Zosyn 3.375g IV q8hr Vancomycin 1750 mg IV Q 24 hrs. Goal AUC 400-550. Expected AUC: 530 SCr used: 1.23    Weight: 77.1 kg (170 lb)  Temp (24hrs), Avg:98.2 F (36.8 C), Min:98 F (36.7 C), Max:98.3 F (36.8 C)  Recent Labs  Lab 12/27/23 0647  WBC 12.6*  CREATININE 1.23    Estimated Creatinine Clearance: 66.3 mL/min (by C-G formula based on SCr of 1.23 mg/dL).    No Known Allergies  Henry Paul S. Merilynn Finland, PharmD, BCPS Clinical Staff Pharmacist  [facility], Henry Paul 12/27/2023 5:25 PM

## 2023-12-27 NOTE — ED Triage Notes (Signed)
 Patient c/o RLQ abd pain that has been going on for a few months, got worse last night.  Patient was told he had a "knot" in his stomach in December and told to come back if it got worse.  Patient has a history of perf colon.

## 2023-12-27 NOTE — Consult Note (Signed)
 Reason for Consult: Abscess Referring Physician: Dr. Rexene Alberts is an 58 y.o. male.  HPI: The patient is a 58 year old white male who initially presented back in November with a contained perforation of the sigmoid colon.  He was treated with a percutaneous drain and had significant improvement.  He was sent home with the drain on oral antibiotics.  At some point after that the drain was removed.  He had a follow-up CT scan recently that showed some enlargement of the abscess in the right lower quadrant.  He was told if he had any increase in his tenderness that he should come to the emergency department so he came this morning.  He says that he feels great but has noticed a little twinge of discomfort in his right lower quadrant.  A repeat CT scan shows some enlargement of the abscess in the right lower quadrant.  The actual diverticulitis of the sigmoid colon seems to be improved.  He denies any fevers or chills.  His appetite is good and his bowels are working normally  Past Medical History:  Diagnosis Date   Fatty liver    GERD (gastroesophageal reflux disease)     Past Surgical History:  Procedure Laterality Date   APPENDECTOMY     CHOLECYSTECTOMY N/A 04/17/2021   Procedure: LAPAROSCOPIC CHOLECYSTECTOMY;  Surgeon: Abigail Miyamoto, MD;  Location: [facility];  Service: General;  Laterality: N/A;   COLONOSCOPY     FINGER SURGERY Left    ring and middle fingers reatttachment after amputation   IR RADIOLOGIST EVAL & MGMT  10/13/2023   WISDOM TOOTH EXTRACTION      History reviewed. No pertinent family history.  Social History:  reports that he has never smoked. He has never used smokeless tobacco. He reports that he does not drink alcohol and does not use drugs.  Allergies: No Known Allergies  Medications: I have reviewed the patient's current medications.  Results for orders placed or performed during the hospital encounter of 12/27/23 (from the past 48 hours)  Lipase,  blood     Status: None   Collection Time: 12/27/23  6:47 AM  Result Value Ref Range   Lipase 33 11 - 51 U/L    Comment: Performed at [facility], [address] [place], [place] [address]  Comprehensive metabolic panel     Status: Abnormal   Collection Time: 12/27/23  6:47 AM  Result Value Ref Range   Sodium 140 135 - 145 mmol/L   Potassium 4.1 3.5 - 5.1 mmol/L   Chloride 105 98 - 111 mmol/L   CO2 22 22 - 32 mmol/L   Glucose, Bld 113 (H) 70 - 99 mg/dL    Comment: Glucose reference range applies only to samples taken after fasting for at least 8 hours.   BUN 17 6 - 20 mg/dL   Creatinine, Ser 6.04 0.61 - 1.24 mg/dL   Calcium 9.9 8.9 - 54.0 mg/dL   Total Protein 7.7 6.5 - 8.1 g/dL   Albumin 4.2 3.5 - 5.0 g/dL   AST 21 15 - 41 U/L   ALT 26 0 - 44 U/L   Alkaline Phosphatase 57 38 - 126 U/L   Total Bilirubin 0.8 0.0 - 1.2 mg/dL   GFR, Estimated >98 >11 mL/min    Comment: (NOTE) Calculated using the CKD-EPI Creatinine Equation (2021)    Anion gap 13 5 - 15    Comment: Performed at [facility], [address] [place],  [place] [address]  CBC     Status: Abnormal   Collection Time: 12/27/23  6:47 AM  Result Value Ref Range   WBC 12.6 (H) 4.0 - 10.5 K/uL   RBC 4.93 4.22 - 5.81 MIL/uL   Hemoglobin 14.4 13.0 - 17.0 g/dL   HCT 57.8 46.9 - 62.9 %   MCV 89.5 80.0 - 100.0 fL   MCH 29.2 26.0 - 34.0 pg   MCHC 32.7 30.0 - 36.0 g/dL   RDW 52.8 41.3 - 24.4 %   Platelets 312 150 - 400 K/uL   nRBC 0.0 0.0 - 0.2 %    Comment: Performed at [facility], [address], [place], [place] [address]  Urinalysis, Routine w reflex microscopic -Urine, Clean Catch     Status: None   Collection Time: 12/27/23  9:58 AM  Result Value Ref Range   Color, Urine YELLOW YELLOW   APPearance CLEAR CLEAR   Specific Gravity, Urine 1.020 1.005 - 1.030   pH 6.0 5.0 - 8.0   Glucose, UA NEGATIVE NEGATIVE mg/dL   Hgb urine dipstick NEGATIVE NEGATIVE    Bilirubin Urine NEGATIVE NEGATIVE   Ketones, ur NEGATIVE NEGATIVE mg/dL   Protein, ur NEGATIVE NEGATIVE mg/dL   Nitrite NEGATIVE NEGATIVE   Leukocytes,Ua NEGATIVE NEGATIVE    Comment: Performed at [facility], [address] [place], [place] [address]    CT ABDOMEN PELVIS W CONTRAST Result Date: 12/27/2023 CLINICAL DATA:  Abdominal pain, history of prior right lower quadrant diverticular abscess status post drainage EXAM: CT ABDOMEN AND PELVIS WITH CONTRAST TECHNIQUE: Multidetector CT imaging of the abdomen and pelvis was performed using the standard protocol following bolus administration of intravenous contrast. RADIATION DOSE REDUCTION: This exam was performed according to the departmental dose-optimization program which includes automated exposure control, adjustment of the mA and/or kV according to patient size and/or use of iterative reconstruction technique. CONTRAST:  OMNIPAQUE IOHEXOL 300 MG/ML  SOLN COMPARISON:  11/30/2023 FINDINGS: Lower chest: No acute pleural or parenchymal lung disease. Hepatobiliary: Hepatic steatosis. Prior cholecystectomy. No biliary duct dilation. Pancreas: Unremarkable. No pancreatic ductal dilatation or surrounding inflammatory changes. Spleen: Normal in size without focal abnormality. Adrenals/Urinary Tract: Stable bilateral renal cortical cysts do not require specific imaging follow-up. No urinary tract calculi or obstructive uropathy. The bladder is decompressed, limiting its evaluation. The adrenals are unremarkable. Stomach/Bowel: The extraluminal gas/fluid collection within the right lower quadrant is again identified. This appears to connect to both the cecum and the sigmoid colon, best seen on image 62/8. The intraperitoneal portion of the gas/fluid collection measures 2.8 x 2.6 cm reference image 65/2, with overall decrease in size since prior study but increased wall thickening. Once again, there is extension of the gas/fluid  collection through the right lower quadrant abdominal wall into the subcutaneous fat. This collection now measures 4.6 x 5.4 cm, with minimal fluid component. There is increasing adjacent subcutaneous fat stranding within the right lower quadrant abdominal wall and extending into the right flank. No bowel obstruction or ileus. Diverticulosis of the sigmoid colon again noted. Decreased bowel wall thickening within the sigmoid colon compatible with resolving diverticulitis. Vascular/Lymphatic: No significant vascular findings are present. No enlarged abdominal or pelvic lymph nodes. Reproductive: Mild enlargement the prostate unchanged. Other: Mesenteric fat stranding within the lower abdomen and pelvis. No free fluid or free intraperitoneal gas. Musculoskeletal: No acute or destructive bony abnormalities. Reconstructed images demonstrate no additional findings. IMPRESSION: 1. Complex gas/fluid collections within the right lower quadrant  of the abdomen as well as the right lower quadrant anterior abdominal wall, with overall progression since prior study. These collections appear to arise from fistulous connections with the sigmoid colon and cecum. Surgical consultation is again recommended. 2. Increasing fat stranding within the right lower quadrant abdominal wall extending to the right flank, which could reflect developing cellulitis. 3. Persistent sigmoid diverticulosis, with improving sigmoid colon wall thickening compatible with resolving diverticulitis. 4. Hepatic steatosis. Electronically Signed   By: Sharlet Salina M.D.   On: 12/27/2023 15:08    Review of Systems  Constitutional: Negative.   HENT: Negative.    Eyes: Negative.   Respiratory: Negative.    Cardiovascular: Negative.   Gastrointestinal: Negative.   Endocrine: Negative.   Genitourinary: Negative.   Musculoskeletal: Negative.   Skin: Negative.   Allergic/Immunologic: Negative.   Neurological: Negative.   Hematological: Negative.    Psychiatric/Behavioral: Negative.     Blood pressure (!) 129/100, pulse (!) 104, temperature 98.1 F (36.7 C), temperature source Oral, resp. rate 18, weight 77.1 kg, SpO2 97%. Physical Exam Vitals reviewed.  Constitutional:      General: He is not in acute distress.    Appearance: Normal appearance. He is normal weight.  HENT:     Head: Normocephalic and atraumatic.     Right Ear: External ear normal.     Left Ear: External ear normal.     Nose: Nose normal.     Mouth/Throat:     Mouth: Mucous membranes are moist.     Pharynx: Oropharynx is clear.  Eyes:     General: No scleral icterus.    Extraocular Movements: Extraocular movements intact.     Conjunctiva/sclera: Conjunctivae normal.     Pupils: Pupils are equal, round, and reactive to light.  Cardiovascular:     Rate and Rhythm: Normal rate and regular rhythm.     Pulses: Normal pulses.     Heart sounds: Normal heart sounds.  Pulmonary:     Effort: Pulmonary effort is normal. No respiratory distress.     Breath sounds: Normal breath sounds.  Abdominal:     General: Abdomen is flat. There is no distension.     Palpations: Abdomen is soft.     Comments: There is very mild focal right lower quadrant tenderness.  No cellulitis  Musculoskeletal:        General: No swelling or deformity. Normal range of motion.     Cervical back: Normal range of motion and neck supple.  Skin:    General: Skin is warm and dry.     Coloration: Skin is not jaundiced.  Neurological:     General: No focal deficit present.     Mental Status: He is alert and oriented to person, place, and time.  Psychiatric:        Mood and Affect: Mood normal.        Behavior: Behavior normal.     Assessment/Plan: The patient appears to have some worsening of the right lower quadrant abscess after his percutaneous drain was removed.  At this point I would agree with admission to the medical service to the hospital.  We will ask interventional radiology to  place a drain in the abscess cavity.  He should start on broad-spectrum antibiotic therapy.  We will monitor him closely with you.  Chevis Pretty III 12/27/2023, 4:40 PM

## 2023-12-28 ENCOUNTER — Encounter (HOSPITAL_COMMUNITY): Payer: Self-pay | Admitting: Internal Medicine

## 2023-12-28 ENCOUNTER — Inpatient Hospital Stay (HOSPITAL_COMMUNITY): Payer: 59

## 2023-12-28 DIAGNOSIS — K651 Peritoneal abscess: Secondary | ICD-10-CM | POA: Diagnosis not present

## 2023-12-28 DIAGNOSIS — D72829 Elevated white blood cell count, unspecified: Secondary | ICD-10-CM

## 2023-12-28 DIAGNOSIS — B999 Unspecified infectious disease: Secondary | ICD-10-CM | POA: Diagnosis not present

## 2023-12-28 DIAGNOSIS — K632 Fistula of intestine: Secondary | ICD-10-CM | POA: Diagnosis not present

## 2023-12-28 LAB — CBC WITH DIFFERENTIAL/PLATELET
Abs Immature Granulocytes: 0.04 10*3/uL (ref 0.00–0.07)
Basophils Absolute: 0.1 10*3/uL (ref 0.0–0.1)
Basophils Relative: 1 %
Eosinophils Absolute: 0.1 10*3/uL (ref 0.0–0.5)
Eosinophils Relative: 1 %
HCT: 38.6 % — ABNORMAL LOW (ref 39.0–52.0)
Hemoglobin: 12.6 g/dL — ABNORMAL LOW (ref 13.0–17.0)
Immature Granulocytes: 0 %
Lymphocytes Relative: 21 %
Lymphs Abs: 1.9 10*3/uL (ref 0.7–4.0)
MCH: 29.4 pg (ref 26.0–34.0)
MCHC: 32.6 g/dL (ref 30.0–36.0)
MCV: 90 fL (ref 80.0–100.0)
Monocytes Absolute: 0.9 10*3/uL (ref 0.1–1.0)
Monocytes Relative: 10 %
Neutro Abs: 6.2 10*3/uL (ref 1.7–7.7)
Neutrophils Relative %: 67 %
Platelets: 235 10*3/uL (ref 150–400)
RBC: 4.29 MIL/uL (ref 4.22–5.81)
RDW: 14.6 % (ref 11.5–15.5)
WBC: 9.3 10*3/uL (ref 4.0–10.5)
nRBC: 0 % (ref 0.0–0.2)

## 2023-12-28 LAB — BASIC METABOLIC PANEL
Anion gap: 12 (ref 5–15)
BUN: 14 mg/dL (ref 6–20)
CO2: 23 mmol/L (ref 22–32)
Calcium: 9.2 mg/dL (ref 8.9–10.3)
Chloride: 101 mmol/L (ref 98–111)
Creatinine, Ser: 1 mg/dL (ref 0.61–1.24)
GFR, Estimated: >60 mL/min (ref >60)
Glucose, Bld: 95 mg/dL (ref 70–99)
Potassium: 4 mmol/L (ref 3.5–5.1)
Sodium: 136 mmol/L (ref 135–145)

## 2023-12-28 LAB — PROTIME-INR
INR: 1 (ref 0.8–1.2)
Prothrombin Time: 13.7 s (ref 11.4–15.2)

## 2023-12-28 MED ORDER — MIDAZOLAM HCL 2 MG/2ML IJ SOLN
INTRAMUSCULAR | Status: AC
Start: 2023-12-28 — End: ?
  Filled 2023-12-28: qty 4

## 2023-12-28 MED ORDER — FLUMAZENIL 0.5 MG/5ML IV SOLN
INTRAVENOUS | Status: AC
  Filled 2023-12-28: qty 5

## 2023-12-28 MED ORDER — MIDAZOLAM HCL 2 MG/2ML IJ SOLN
INTRAMUSCULAR | Status: AC | PRN
  Administered 2023-12-28: 2 mg via INTRAVENOUS

## 2023-12-28 MED ORDER — FENTANYL CITRATE (PF) 100 MCG/2ML IJ SOLN
INTRAMUSCULAR | Status: AC
  Filled 2023-12-28: qty 4

## 2023-12-28 MED ORDER — MIDAZOLAM HCL 2 MG/2ML IJ SOLN
INTRAMUSCULAR | Status: AC | PRN
  Administered 2023-12-28: 1 mg via INTRAVENOUS

## 2023-12-28 MED ORDER — NALOXONE HCL 0.4 MG/ML IJ SOLN
INTRAMUSCULAR | Status: AC
  Filled 2023-12-28: qty 1

## 2023-12-28 MED ORDER — FENTANYL CITRATE (PF) 100 MCG/2ML IJ SOLN
INTRAMUSCULAR | Status: AC | PRN
  Administered 2023-12-28: 50 ug via INTRAVENOUS

## 2023-12-28 NOTE — H&P (Signed)
 Chief Complaint: recurrent right lower quadrant diverticular abscess - image guided right lower quadrant drain placement   Referring Provider(s): Patrecia Pour, Seth Bake  Supervising Physician: Marliss Coots  Patient Status: Henry Paul - In-pt  History of Present Illness: Henry Paul is a 58 y.o. male with a history of GERD, BPH, fatty liver disease, and recurrent diverticulitis.  Pt is known to [place] from recent hospital stay (09/23/23-10/01/23) and diverticulitis perforation where Dr. Milford Cage placed a right lower quadrant abscess drain on 09/29/23 that as subsequently removed on 10/13/23.  The pt presented to the emergency department 12/27/23 complaining of new, increased pain in the right lower quadrant that is non radiating.  A CT abdomen pelvis was obtained 12/27/23 revealing complex gas/fluid collections within the right lower quadrant of the abdomen as well as the right lower quadrant anterior abdominal wall, with overall progression since prior study.  Pt was admitted and interventional radiology was consulted for possible image guided right lower quadrant drain placement.  Imaging was reviewed and approved by Dr. Elby Showers 12/28/23 for image guided right lower quadrant drain placement.    Patient is Full Code  Past Medical History:  Diagnosis Date   Fatty liver    GERD (gastroesophageal reflux disease)     Past Surgical History:  Procedure Laterality Date   APPENDECTOMY     CHOLECYSTECTOMY N/A 04/17/2021   Procedure: LAPAROSCOPIC CHOLECYSTECTOMY;  Surgeon: Analena Gama Miyamoto, MD;  Location: [facility];  Service: General;  Laterality: N/A;   COLONOSCOPY     FINGER SURGERY Left    ring and middle fingers reatttachment after amputation   IR RADIOLOGIST EVAL & MGMT  10/13/2023   WISDOM TOOTH EXTRACTION      Allergies: Patient has no known allergies.  Medications: Prior to Admission medications   Medication Sig Start Date End Date Taking? Authorizing Provider  acetaminophen (TYLENOL) 500  MG tablet Take 1,000 mg by mouth every 6 (six) hours as needed for mild pain (pain score 1-3) or moderate pain (pain score 4-6).   Yes [provider]  cetirizine (ZYRTEC) 10 MG tablet Take 10 mg by mouth at bedtime.   Yes [provider]  docusate sodium (COLACE) 100 MG capsule Take 100 mg by mouth See admin instructions. Take 100 mg by mouth once a day and an additional 100 mg up to two times daily as needed for constipation   Yes [provider]  metoprolol tartrate (LOPRESSOR) 25 MG tablet Take 1 tablet (25 mg total) by mouth 2 (two) times daily. 11/19/23  Yes Shah, Pratik D, DO  pantoprazole (PROTONIX) 20 MG tablet Take 20 mg by mouth at bedtime. 04/02/21  Yes [provider]  tamsulosin (FLOMAX) 0.4 MG CAPS capsule Take 0.4 mg by mouth at bedtime. 03/24/21  Yes [provider]  TYLENOL PM EXTRA STRENGTH 500-25 MG TABS tablet Take 2 tablets by mouth at bedtime.   Yes [provider]     History reviewed. No pertinent family history.  Social History   Socioeconomic History   Marital status: Married    Spouse name: Not on file   Number of children: Not on file   Years of education: Not on file   Highest education level: Not on file  Occupational History   Not on file  Tobacco Use   Smoking status: Never   Smokeless tobacco: Never  Substance and Sexual Activity   Alcohol use: No   Drug use: No   Sexual activity: Not on file  Other Topics Concern   Not on file  Social History Narrative   Works in [employer]   Social Drivers of Health   Financial Resource Strain: Not on file  Food Insecurity: No Food Insecurity (12/27/2023)   Hunger Vital Sign    Worried About Running Out of Food in the Last Year: Never true    Ran Out of Food in the Last Year: Never true  Transportation Needs: No Transportation Needs (12/27/2023)   PRAPARE - Administrator, Civil Service (Medical): No    Lack of Transportation (Non-Medical): No   Physical Activity: Not on file  Stress: Not on file  Social Connections: Not on file     Review of Systems: A 12 point ROS discussed and pertinent positives are indicated in the HPI above.  All other systems are negative.  Review of Systems  Constitutional:  Negative for fatigue and fever.  HENT:  Negative for congestion and dental problem.   Respiratory:  Negative for cough, chest tightness, shortness of breath and wheezing.   Cardiovascular:  Negative for chest pain.  Gastrointestinal:  Negative for diarrhea, nausea and vomiting.  Neurological:  Negative for light-headedness and headaches.  Psychiatric/Behavioral:  Negative for agitation, behavioral problems and confusion.     Vital Signs: BP 123/89 (BP Location: Left Arm)   Pulse 88   Temp 98.2 F (36.8 C) (Oral)   Resp 18   Ht 5\' 9"  (1.753 m)   Wt 169 lb 15.6 oz (77.1 kg)   SpO2 95%   BMI 25.10 kg/m   Advance Care Plan: The advanced care place/surrogate decision maker was discussed at the time of visit and the patient did not wish to discuss or was not able to name a surrogate decision maker or provide an advance care plan.  Physical Exam Constitutional:      Appearance: Normal appearance.  HENT:     Head: Normocephalic and atraumatic.     Mouth/Throat:     Mouth: Mucous membranes are moist.  Cardiovascular:     Rate and Rhythm: Normal rate and regular rhythm.  Pulmonary:     Effort: Pulmonary effort is normal.     Breath sounds: Normal breath sounds.  Abdominal:     General: Bowel sounds are normal.     Palpations: Abdomen is soft.  Musculoskeletal:        General: Normal range of motion.     Cervical back: Normal range of motion.  Skin:    General: Skin is warm and dry.  Neurological:     Mental Status: He is alert and oriented to person, place, and time.  Psychiatric:        Mood and Affect: Mood normal.        Behavior: Behavior normal.     Imaging: CT ABDOMEN PELVIS W CONTRAST Result Date:  12/27/2023 CLINICAL DATA:  Abdominal pain, history of prior right lower quadrant diverticular abscess status post drainage EXAM: CT ABDOMEN AND PELVIS WITH CONTRAST TECHNIQUE: Multidetector CT imaging of the abdomen and pelvis was performed using the standard protocol following bolus administration of intravenous contrast. RADIATION DOSE REDUCTION: This exam was performed according to the departmental dose-optimization program which includes automated exposure control, adjustment of the mA and/or kV according to patient size and/or use of iterative reconstruction technique. CONTRAST:  OMNIPAQUE IOHEXOL 300 MG/ML  SOLN COMPARISON:  11/30/2023 FINDINGS: Lower chest: No acute pleural or parenchymal lung disease. Hepatobiliary: Hepatic steatosis. Prior cholecystectomy. No biliary duct dilation. Pancreas: Unremarkable.  No pancreatic ductal dilatation or surrounding inflammatory changes. Spleen: Normal in size without focal abnormality. Adrenals/Urinary Tract: Stable bilateral renal cortical cysts do not require specific imaging follow-up. No urinary tract calculi or obstructive uropathy. The bladder is decompressed, limiting its evaluation. The adrenals are unremarkable. Stomach/Bowel: The extraluminal gas/fluid collection within the right lower quadrant is again identified. This appears to connect to both the cecum and the sigmoid colon, best seen on image 62/8. The intraperitoneal portion of the gas/fluid collection measures 2.8 x 2.6 cm reference image 65/2, with overall decrease in size since prior study but increased wall thickening. Once again, there is extension of the gas/fluid collection through the right lower quadrant abdominal wall into the subcutaneous fat. This collection now measures 4.6 x 5.4 cm, with minimal fluid component. There is increasing adjacent subcutaneous fat stranding within the right lower quadrant abdominal wall and extending into the right flank. No bowel obstruction or ileus.  Diverticulosis of the sigmoid colon again noted. Decreased bowel wall thickening within the sigmoid colon compatible with resolving diverticulitis. Vascular/Lymphatic: No significant vascular findings are present. No enlarged abdominal or pelvic lymph nodes. Reproductive: Mild enlargement the prostate unchanged. Other: Mesenteric fat stranding within the lower abdomen and pelvis. No free fluid or free intraperitoneal gas. Musculoskeletal: No acute or destructive bony abnormalities. Reconstructed images demonstrate no additional findings. IMPRESSION: 1. Complex gas/fluid collections within the right lower quadrant of the abdomen as well as the right lower quadrant anterior abdominal wall, with overall progression since prior study. These collections appear to arise from fistulous connections with the sigmoid colon and cecum. Surgical consultation is again recommended. 2. Increasing fat stranding within the right lower quadrant abdominal wall extending to the right flank, which could reflect developing cellulitis. 3. Persistent sigmoid diverticulosis, with improving sigmoid colon wall thickening compatible with resolving diverticulitis. 4. Hepatic steatosis. Electronically Signed   By: Sharlet Salina M.D.   On: 12/27/2023 15:08   CT ABDOMEN PELVIS W CONTRAST Result Date: 11/30/2023 CLINICAL DATA:  History of prior percutaneous catheter drainage of right lower quadrant diverticular abscess last November with removal of the drain on 10/13/2023. Recent hospitalization for recurrent abscess which did not require additional drainage procedure and was treated with antibiotics. EXAM: CT ABDOMEN AND PELVIS WITH CONTRAST TECHNIQUE: Multidetector CT imaging of the abdomen and pelvis was performed using the standard protocol following bolus administration of intravenous contrast. RADIATION DOSE REDUCTION: This exam was performed according to the departmental dose-optimization program which includes automated exposure control,  adjustment of the mA and/or kV according to patient size and/or use of iterative reconstruction technique. CONTRAST:  ISOVUE-300 IOPAMIDOL (ISOVUE-300) INJECTION 61% COMPARISON:  11/15/2023 as well as other prior studies. FINDINGS: Lower chest: No acute abnormality. Hepatobiliary: No focal liver abnormality is seen. Status post cholecystectomy. No biliary dilatation. Pancreas: Unremarkable. No pancreatic ductal dilatation or surrounding inflammatory changes. Spleen: Normal in size without focal abnormality. Adrenals/Urinary Tract: Adrenal glands are unremarkable. Kidneys are normal, without renal calculi, focal lesion, or hydronephrosis. Stable bilateral Bosniak 1 simple cysts of both kidneys require no follow-up. Bladder is unremarkable. Stomach/Bowel: The loculations of free intraperitoneal air seen on the prior study have resolved. In the right lower quadrant, the complex air and fluid collection seen previously now appears to be almost entirely a region of extraluminal air with a dominant pocket abutting the abdominal wall measuring approximately 4 cm in maximum diameter. There also is new herniation of air through the abdominal wall extending just into the subcutaneous fat outside of  the rectus musculature measuring 3.3 cm in diameter. Exact source of this air is again somewhat difficult to determine and may either be from the cecal tip, distal small bowel or sigmoid colon. There is stable wall thickening of the sigmoid colon. Inflammation in the pelvic mesenteric fat appears slightly improved compared to the prior study with some persistent inflammation remaining. The appendix is been removed. Vascular/Lymphatic: No significant vascular findings are present. No enlarged abdominal or pelvic lymph nodes. Reproductive: Prostate is unremarkable. Other: Mild inflammatory changes in the right lower abdominal wall subcutaneous fat. Musculoskeletal: No acute or significant osseous findings. IMPRESSION: 1. The  loculations of free intraperitoneal air seen on the prior study have resolved. 2. The complex air and fluid collection seen previously in the right lower quadrant now appears to be almost entirely a region of extraluminal air with a dominant pocket abutting the abdominal wall measuring approximately 4 cm in maximum diameter. There also is new herniation of air through the abdominal wall extending just into the subcutaneous fat outside of the rectus musculature measuring 3.3 cm in diameter. Exact source of this air is again somewhat difficult to determine and may either be from the cecal tip, distal small bowel or sigmoid colon. Surgical follow-up is recommended. 3. Stable wall thickening of the sigmoid colon. Inflammation in the pelvic mesenteric fat appears slightly improved compared to the prior study with some persistent inflammation remaining. 4. Stable bilateral Bosniak 1 simple cysts of both kidneys require no follow-up. Electronically Signed   By: Irish Lack M.D.   On: 11/30/2023 16:27    Labs:  CBC: Recent Labs    11/18/23 0417 11/19/23 0458 12/27/23 0647 12/28/23 0350  WBC 12.1* 9.6 12.6* 9.3  HGB 13.6 12.8* 14.4 12.6*  HCT 39.5 37.7* 44.1 38.6*  PLT 283 282 312 235    COAGS: Recent Labs    09/29/23 1037 11/15/23 1709  INR 1.1 1.1  APTT  --  31    BMP: Recent Labs    11/18/23 0417 11/19/23 0458 12/27/23 0647 12/28/23 0350  NA 133* 135 140 136  K 3.7 4.0 4.1 4.0  CL 100 103 105 101  CO2 21* 21* 22 23  GLUCOSE 111* 101* 113* 95  BUN 13 17 17 14   CALCIUM 8.8* 8.8* 9.9 9.2  CREATININE 1.03 0.95 1.23 1.00  GFRNONAA >60 >60 >60 >60    LIVER FUNCTION TESTS: Recent Labs    09/24/23 1122 11/15/23 1554 11/16/23 0437 12/27/23 0647  BILITOT 1.9* 1.4* 1.4* 0.8  AST 22 15 17 21   ALT 38 26 22 26   ALKPHOS 44 50 48 57  PROT 7.6 7.6 6.5 7.7  ALBUMIN 4.3 4.7 3.5 4.2    TUMOR MARKERS: No results for input(s): "AFPTM", "CEA", "CA199", "CHROMGRNA" in the last 8760  hours.  Assessment and Plan:  Pt with recurrent right lower quadrant diverticular abscess scheduled image guided right lower quadrant drain placement 12/28/23.  Imaging was reviewed and approved by Dr. Elby Showers 12/28/23 for image guided right lower quadrant drain placement.  Risks and benefits discussed with the patient including bleeding, infection, damage to adjacent structures, bowel perforation/fistula connection, and sepsis.  All of the patient's questions were answered, patient is agreeable to proceed. Consent signed and in chart.   Thank you for allowing our service to participate in Henry Paul 's care.  Electronically Signed: Loman Brooklyn, PA-C   12/28/2023, 8:47 AM    I spent a total of 40 Minutes    in  face to face in clinical consultation, greater than 50% of which was counseling/coordinating care for image guided right lower quadrant drain placement.

## 2023-12-28 NOTE — Progress Notes (Signed)
 Progress Note     Subjective: Pt reports some soreness right below right ribs. Denies worsening or severe pain. Had a BM yesterday and passing flatus. Denies nausea or vomiting. No drainage from previous drain site ever. AFVSS.   Objective: Vital signs in last 24 hours: Temp:  [97.7 F (36.5 C)-98.4 F (36.9 C)] 97.7 F (36.5 C) (02/25 0848) Pulse Rate:  [84-107] 84 (02/25 0943) Resp:  [16-18] 16 (02/25 0848) BP: (106-140)/(76-100) 127/89 (02/25 0943) SpO2:  [94 %-99 %] 97 % (02/25 0848) Weight:  [77.1 kg] 77.1 kg (02/24 2358) Last BM Date : 12/27/23  Intake/Output from previous day: 02/24 0701 - 02/25 0700 In: 2213.8 [I.V.:719.3; IV Piggyback:1494.5] Out: 1 [Urine:1] Intake/Output this shift: No intake/output data recorded.  PE: General: pleasant, WD, WN male, NAD Lungs: Respiratory effort nonlabored Abd: soft, NT, ND, previous drain site well healed without drainage, no erythema or cellulitis of abdominal wall  Psych: A&Ox3 with an appropriate affect.    Lab Results:  Recent Labs    12/27/23 0647 12/28/23 0350  WBC 12.6* 9.3  HGB 14.4 12.6*  HCT 44.1 38.6*  PLT 312 235   BMET Recent Labs    12/27/23 0647 12/28/23 0350  NA 140 136  K 4.1 4.0  CL 105 101  CO2 22 23  GLUCOSE 113* 95  BUN 17 14  CREATININE 1.23 1.00  CALCIUM 9.9 9.2   PT/INR Recent Labs    12/28/23 0840  LABPROT 13.7  INR 1.0   CMP     Component Value Date/Time   NA 136 12/28/2023 0350   K 4.0 12/28/2023 0350   CL 101 12/28/2023 0350   CO2 23 12/28/2023 0350   GLUCOSE 95 12/28/2023 0350   BUN 14 12/28/2023 0350   CREATININE 1.00 12/28/2023 0350   CALCIUM 9.2 12/28/2023 0350   PROT 7.7 12/27/2023 0647   ALBUMIN 4.2 12/27/2023 0647   AST 21 12/27/2023 0647   ALT 26 12/27/2023 0647   ALKPHOS 57 12/27/2023 0647   BILITOT 0.8 12/27/2023 0647   GFRNONAA >60 12/28/2023 0350   GFRAA >60 01/17/2018 0451   Lipase     Component Value Date/Time   LIPASE 33 12/27/2023 0647        Studies/Results: CT ABDOMEN PELVIS W CONTRAST Result Date: 12/27/2023 CLINICAL DATA:  Abdominal pain, history of prior right lower quadrant diverticular abscess status post drainage EXAM: CT ABDOMEN AND PELVIS WITH CONTRAST TECHNIQUE: Multidetector CT imaging of the abdomen and pelvis was performed using the standard protocol following bolus administration of intravenous contrast. RADIATION DOSE REDUCTION: This exam was performed according to the departmental dose-optimization program which includes automated exposure control, adjustment of the mA and/or kV according to patient size and/or use of iterative reconstruction technique. CONTRAST:  OMNIPAQUE IOHEXOL 300 MG/ML  SOLN COMPARISON:  11/30/2023 FINDINGS: Lower chest: No acute pleural or parenchymal lung disease. Hepatobiliary: Hepatic steatosis. Prior cholecystectomy. No biliary duct dilation. Pancreas: Unremarkable. No pancreatic ductal dilatation or surrounding inflammatory changes. Spleen: Normal in size without focal abnormality. Adrenals/Urinary Tract: Stable bilateral renal cortical cysts do not require specific imaging follow-up. No urinary tract calculi or obstructive uropathy. The bladder is decompressed, limiting its evaluation. The adrenals are unremarkable. Stomach/Bowel: The extraluminal gas/fluid collection within the right lower quadrant is again identified. This appears to connect to both the cecum and the sigmoid colon, best seen on image 62/8. The intraperitoneal portion of the gas/fluid collection measures 2.8 x 2.6 cm reference image 65/2, with overall decrease  in size since prior study but increased wall thickening. Once again, there is extension of the gas/fluid collection through the right lower quadrant abdominal wall into the subcutaneous fat. This collection now measures 4.6 x 5.4 cm, with minimal fluid component. There is increasing adjacent subcutaneous fat stranding within the right lower quadrant abdominal wall  and extending into the right flank. No bowel obstruction or ileus. Diverticulosis of the sigmoid colon again noted. Decreased bowel wall thickening within the sigmoid colon compatible with resolving diverticulitis. Vascular/Lymphatic: No significant vascular findings are present. No enlarged abdominal or pelvic lymph nodes. Reproductive: Mild enlargement the prostate unchanged. Other: Mesenteric fat stranding within the lower abdomen and pelvis. No free fluid or free intraperitoneal gas. Musculoskeletal: No acute or destructive bony abnormalities. Reconstructed images demonstrate no additional findings. IMPRESSION: 1. Complex gas/fluid collections within the right lower quadrant of the abdomen as well as the right lower quadrant anterior abdominal wall, with overall progression since prior study. These collections appear to arise from fistulous connections with the sigmoid colon and cecum. Surgical consultation is again recommended. 2. Increasing fat stranding within the right lower quadrant abdominal wall extending to the right flank, which could reflect developing cellulitis. 3. Persistent sigmoid diverticulosis, with improving sigmoid colon wall thickening compatible with resolving diverticulitis. 4. Hepatic steatosis. Electronically Signed   By: Sharlet Salina M.D.   On: 12/27/2023 15:08    Anti-infectives: Anti-infectives (From admission, onward)    Start     Dose/Rate Route Frequency Ordered Stop   12/28/23 1800  vancomycin (VANCOREADY) IVPB 1750 mg/350 mL        1,750 mg 175 mL/hr over 120 Minutes Intravenous Every 24 hours 12/27/23 1728     12/27/23 2200  piperacillin-tazobactam (ZOSYN) IVPB 3.375 g        3.375 g 12.5 mL/hr over 240 Minutes Intravenous Every 8 hours 12/27/23 1720     12/27/23 1730  vancomycin (VANCOCIN) IVPB 1000 mg/200 mL premix  Status:  Discontinued        1,000 mg 200 mL/hr over 60 Minutes Intravenous  Once 12/27/23 1720 12/27/23 1724   12/27/23 1730  vancomycin  (VANCOREADY) IVPB 1750 mg/350 mL        1,750 mg 175 mL/hr over 120 Minutes Intravenous  Once 12/27/23 1725 12/27/23 1950   12/27/23 1600  piperacillin-tazobactam (ZOSYN) IVPB 3.375 g        3.375 g 100 mL/hr over 30 Minutes Intravenous  Once 12/27/23 1549 12/27/23 1639        Assessment/Plan  Hx of diverticulitis with abscess s/p IR drain 11-10/2023 RLQ and RLQ abdominal wall abscesses vs developing colocutaneous fistula  - CT yesterday with above, also questions cellulitis of RLQ abdominal wall - this is not seen clinically on exam - IR consulted and planning drain placement today  - WBC 9.3 from 12.6, afebrile and HD stable  - no indication for emergent surgical intervention  - agree with continuing IV abx and drain placement  - surgery will continue to follow   FEN: NPO, IVF per [facility] VTE: LMWH ID: [email or web address]  - per [facility] -  GERD Hepatosteatosis   LOS: 1 day   I reviewed Consultant IR notes, last 24 h vitals and pain scores, last 48 h intake and output, last 24 h labs and trends, and last 24 h imaging results.  This care required moderate level of medical decision making.    Juliet Rude, [facility] Surgery 12/28/2023, 10:30 AM Please see Amion for pager number  during day hours 7:00am-4:30pm

## 2023-12-28 NOTE — Progress Notes (Signed)
 [facility]                                                                               Henry Paul, is a 58 y.o. male, DOB - 15-Dec-1965, ZOX:[id number] Admit date - 12/27/2023    Outpatient Primary MD for the patient is [name], MD  LOS - 1  days    Brief summary    Henry Paul is a 58 y.o. male with medical history significant of sigmoid diverticulitis and abscess underwent percutaneous catheter drainage of the right lower quadrant peritoneal abscess on 09/29/2023 discharged from the hospital 1129 and completed the course of antibiotics and ultimately the drain  was removed on 10/13/2023, readmitted on 1/13 for nausea and abdominal pain, discharged on oral antibiotics on 11/19/2023, now presents with some lower quadrant abdominal pain and an abnormal CT scan of the abdomen and pelvis.   CT of the abdomen pelvis done showing Complex gas/fluid collections within the right lower quadrant of the abdomen as well as the right lower quadrant anterior abdominal wall, with overall progression since prior study. These collections appear to arise from fistulous connections with the sigmoid colon and cecum. showing some enlargement of the abscess in the right lower quadrant.     Increasing fat stranding within the right lower quadrant abdominal wall extending to the right flank, which could reflect developing cellulitis.   Assessment & Plan    Assessment and Plan:  Intraabdominal abscess with developing surrounding cellulitis:  Start the patient on IV Vancomycin and IV Zosyn, and IV fluids.  Npo for now.  General surgery consulted.  IR consulted for perc drain placement. Scheduled for later today.  Leukocytosis resolved. Narrow the antibiotics as  per the fluids cultures.     GERD stable on PPI.      Cellulitis of the RLQ Abdominal wall:  Pt started on IV vancomycin.  Continue to monitor.      Hypertension;  BP parameters optimal.  Resume metoprolol.      Estimated body mass index is 25.1 kg/m as calculated from the following:   Height as of this encounter: 5\' 9"  (1.753 m).   Weight as of this encounter: 77.1 kg.  Code Status: full code.  DVT Prophylaxis:  enoxaparin (LOVENOX) injection 40 mg Start: 12/27/23 2200   Level of Care: Level of care: Progressive Family Communication: none at bedside.   Disposition Plan:     Remains inpatient appropriate:  IV antibiotics.   Procedures:  IR drain placement on RLQ for the abdominal abscess.   Consultants:   [facility]  IR   Antimicrobials:   Anti-infectives (From admission, onward)    Start     Dose/Rate Route Frequency Ordered Stop   12/28/23 1800  vancomycin (VANCOREADY) IVPB 1750 mg/350 mL        1,750 mg 175 mL/hr over 120 Minutes Intravenous Every 24 hours 12/27/23 1728     12/27/23 2200  piperacillin-tazobactam (ZOSYN) IVPB 3.375 g        3.375 g 12.5 mL/hr over 240 Minutes Intravenous Every 8 hours 12/27/23 1720     12/27/23 1730  vancomycin (VANCOCIN) IVPB 1000 mg/200 mL premix  Status:  Discontinued        1,000 mg 200 mL/hr over 60 Minutes Intravenous  Once 12/27/23 1720 12/27/23 1724   12/27/23 1730  vancomycin (VANCOREADY) IVPB 1750 mg/350 mL        1,750 mg 175 mL/hr over 120 Minutes Intravenous  Once 12/27/23 1725 12/27/23 1950   12/27/23 1600  piperacillin-tazobactam (ZOSYN) IVPB 3.375 g        3.375 g 100 mL/hr over 30 Minutes Intravenous  Once 12/27/23 1549 12/27/23 1639        Medications  Scheduled Meds:  enoxaparin (LOVENOX) injection  40 mg Subcutaneous Q24H   loratadine  10 mg Oral Daily   metoprolol tartrate  25 mg Oral BID   pantoprazole  20 mg Oral QHS   tamsulosin  0.4 mg Oral QHS   Continuous Infusions:  lactated ringers 75 mL/hr at 12/28/23 0239   piperacillin-tazobactam (ZOSYN)  IV 3.375 g (12/28/23 1334)   vancomycin     PRN Meds:.acetaminophen, HYDROcodone-acetaminophen, HYDROmorphone (DILAUDID) injection, ondansetron (ZOFRAN)  IV    Subjective:   Collene Gobble was seen and examined today.  ABD PAIN IS IMPROVING.   Objective:   Vitals:   12/28/23 0047 12/28/23 0343 12/28/23 0848 12/28/23 0943  BP: 122/87 123/89 127/89 127/89  Pulse: 93 88 84 84  Resp: 17 18 16    Temp: 98.4 F (36.9 C) 98.2 F (36.8 C) 97.7 F (36.5 C)   TempSrc: Oral Oral Oral   SpO2: 95% 95% 97%   Weight:      Height:        Intake/Output Summary (Last 24 hours) at 12/28/2023 1339 Last data filed at 12/28/2023 0325 Gross per 24 hour  Intake 2213.8 ml  Output 1 ml  Net 2212.8 ml   Filed Weights   12/27/23 0645 12/27/23 2358  Weight: 77.1 kg 77.1 kg     Exam General: Alert and oriented x 3, NAD Cardiovascular: S1 S2 auscultated, no murmurs, RRR Respiratory: Clear to auscultation bilaterally, no wheezing, rales or rhonchi Gastrointestinal: soft, tender int he RLQ,  Ext: no pedal edema bilaterally Neuro: AAOx3, Cr N's II- XII. Strength 5/5 upper and lower extremities bilaterally Skin: some erythema on RLQ. Psych: Normal affect and demeanor, alert and oriented x3    Data Reviewed:  I have personally reviewed following labs and imaging studies   CBC Lab Results  Component Value Date   WBC 9.3 12/28/2023   RBC 4.29 12/28/2023   HGB 12.6 (L) 12/28/2023   HCT 38.6 (L) 12/28/2023   MCV 90.0 12/28/2023   MCH 29.4 12/28/2023   PLT 235 12/28/2023   MCHC 32.6 12/28/2023   RDW 14.6 12/28/2023   LYMPHSABS 1.9 12/28/2023   MONOABS 0.9 12/28/2023   EOSABS 0.1 12/28/2023   BASOSABS 0.1 12/28/2023     Last metabolic panel Lab Results  Component Value Date   NA 136 12/28/2023   K 4.0 12/28/2023   CL 101 12/28/2023   CO2 23 12/28/2023   BUN 14 12/28/2023   CREATININE 1.00 12/28/2023   GLUCOSE 95 12/28/2023   GFRNONAA >60 12/28/2023   GFRAA >60 01/17/2018   CALCIUM 9.2 12/28/2023   PROT 7.7 12/27/2023   ALBUMIN 4.2 12/27/2023   BILITOT 0.8 12/27/2023   ALKPHOS 57 12/27/2023   AST 21 12/27/2023   ALT 26  12/27/2023   ANIONGAP 12 12/28/2023    CBG (last 3)  No results for input(s): "GLUCAP" in the last 72 hours.    Coagulation Profile: Recent Labs  Lab 12/28/23 0840  INR 1.0     Radiology Studies: CT ABDOMEN PELVIS W CONTRAST Result Date: 12/27/2023 CLINICAL DATA:  Abdominal pain, history of prior right lower quadrant diverticular abscess status post drainage EXAM: CT ABDOMEN AND PELVIS WITH CONTRAST TECHNIQUE: Multidetector CT imaging of the abdomen and pelvis was performed using the standard protocol following bolus administration of intravenous contrast. RADIATION DOSE REDUCTION: This exam was performed according to the departmental dose-optimization program which includes automated exposure control, adjustment of the mA and/or kV according to patient size and/or use of iterative reconstruction technique. CONTRAST:  OMNIPAQUE IOHEXOL 300 MG/ML  SOLN COMPARISON:  11/30/2023 FINDINGS: Lower chest: No acute pleural or parenchymal lung disease. Hepatobiliary: Hepatic steatosis. Prior cholecystectomy. No biliary duct dilation. Pancreas: Unremarkable. No pancreatic ductal dilatation or surrounding inflammatory changes. Spleen: Normal in size without focal abnormality. Adrenals/Urinary Tract: Stable bilateral renal cortical cysts do not require specific imaging follow-up. No urinary tract calculi or obstructive uropathy. The bladder is decompressed, limiting its evaluation. The adrenals are unremarkable. Stomach/Bowel: The extraluminal gas/fluid collection within the right lower quadrant is again identified. This appears to connect to both the cecum and the sigmoid colon, best seen on image 62/8. The intraperitoneal portion of the gas/fluid collection measures 2.8 x 2.6 cm reference image 65/2, with overall decrease in size since prior study but increased wall thickening. Once again, there is extension of the gas/fluid collection through the right lower quadrant abdominal wall into the  subcutaneous fat. This collection now measures 4.6 x 5.4 cm, with minimal fluid component. There is increasing adjacent subcutaneous fat stranding within the right lower quadrant abdominal wall and extending into the right flank. No bowel obstruction or ileus. Diverticulosis of the sigmoid colon again noted. Decreased bowel wall thickening within the sigmoid colon compatible with resolving diverticulitis. Vascular/Lymphatic: No significant vascular findings are present. No enlarged abdominal or pelvic lymph nodes. Reproductive: Mild enlargement the prostate unchanged. Other: Mesenteric fat stranding within the lower abdomen and pelvis. No free fluid or free intraperitoneal gas. Musculoskeletal: No acute or destructive bony abnormalities. Reconstructed images demonstrate no additional findings. IMPRESSION: 1. Complex gas/fluid collections within the right lower quadrant of the abdomen as well as the right lower quadrant anterior abdominal wall, with overall progression since prior study. These collections appear to arise from fistulous connections with the sigmoid colon and cecum. Surgical consultation is again recommended. 2. Increasing fat stranding within the right lower quadrant abdominal wall extending to the right flank, which could reflect developing cellulitis. 3. Persistent sigmoid diverticulosis, with improving sigmoid colon wall thickening compatible with resolving diverticulitis. 4. Hepatic steatosis. Electronically Signed   By: Sharlet Salina M.D.   On: 12/27/2023 15:08       Kathlen Mody M.D. [facility] 12/28/2023, 1:39 PM  Available via Epic secure chat 7am-7pm After 7 pm, please refer to night coverage provider listed on amion.

## 2023-12-29 ENCOUNTER — Other Ambulatory Visit: Payer: Self-pay | Admitting: Radiology

## 2023-12-29 DIAGNOSIS — B999 Unspecified infectious disease: Secondary | ICD-10-CM | POA: Diagnosis not present

## 2023-12-29 LAB — CREATININE, SERUM
Creatinine, Ser: 0.98 mg/dL (ref 0.61–1.24)
GFR, Estimated: >60 mL/min (ref >60)

## 2023-12-29 MED ORDER — AMOXICILLIN-POT CLAVULANATE 875-125 MG PO TABS: 1.0000 | ORAL_TABLET | Freq: Two times a day (BID) | ORAL | 0 refills | Status: AC

## 2023-12-29 MED ORDER — NORMAL SALINE FLUSH 0.9 % IV SOLN: 5.0000 mL | Freq: Every day | INTRAVENOUS | 2 refills | Status: DC

## 2023-12-29 NOTE — Progress Notes (Signed)
 Subjective/Chief Complaint: No complaints. Drain in place   Objective: Vital signs in last 24 hours: Temp:  [97.7 F (36.5 C)-98.8 F (37.1 C)] 98.5 F (36.9 C) (02/26 0330) Pulse Rate:  [73-94] 94 (02/26 0330) Resp:  [16-22] 18 (02/26 0330) BP: (108-127)/(77-89) 123/86 (02/26 0330) SpO2:  [95 %-98 %] 95 % (02/26 0330) Last BM Date : 12/29/23  Intake/Output from previous day: 02/25 0701 - 02/26 0700 In: 1277.3 [I.V.:777.4; IV Piggyback:499.9] Out: 30 [Drains:30] Intake/Output this shift: No intake/output data recorded.  General appearance: alert and cooperative Resp: clear to auscultation bilaterally Cardio: regular rate and rhythm GI: soft, nontender. Drain output dark brown  Lab Results:  Recent Labs    12/27/23 0647 12/28/23 0350  WBC 12.6* 9.3  HGB 14.4 12.6*  HCT 44.1 38.6*  PLT 312 235   BMET Recent Labs    12/27/23 0647 12/28/23 0350 12/29/23 0417  NA 140 136  --   K 4.1 4.0  --   CL 105 101  --   CO2 22 23  --   GLUCOSE 113* 95  --   BUN 17 14  --   CREATININE 1.23 1.00 0.98  CALCIUM 9.9 9.2  --    PT/INR Recent Labs    12/28/23 0840  LABPROT 13.7  INR 1.0   ABG No results for input(s): "PHART", "HCO3" in the last 72 hours.  Invalid input(s): "PCO2", "PO2"  Studies/Results: CT GUIDED PERITONEAL/RETROPERITONEAL FLUID DRAIN BY PERC CATH Result Date: 12/28/2023 INDICATION: 58 year old male with history of recurrent right lower quadrant anterior abdominal gas containing abscess. EXAM: CT PERC DRAIN PERITONEAL ABCESS COMPARISON:  None Available. MEDICATIONS: The patient is currently admitted to the hospital and receiving intravenous antibiotics. The antibiotics were administered within an appropriate time frame prior to the initiation of the procedure. ANESTHESIA/SEDATION: Moderate (conscious) sedation was employed during this procedure. A total of Versed 4 mg and Fentanyl 100 mcg was administered intravenously. Moderate Sedation Time: 14  minutes. The patient's level of consciousness and vital signs were monitored continuously by radiology nursing throughout the procedure under my direct supervision. CONTRAST:  None COMPLICATIONS: None immediate. PROCEDURE: RADIATION DOSE REDUCTION: This exam was performed according to the departmental dose-optimization program which includes automated exposure control, adjustment of the mA and/or kV according to patient size and/or use of iterative reconstruction technique. Informed written consent was obtained from the patient after a discussion of the risks, benefits and alternatives to treatment. The patient was placed supine on the CT gantry and a pre procedural CT was performed re-demonstrating the known abscess/fluid collection within the right lower quadrant. The procedure was planned. A timeout was performed prior to the initiation of the procedure. The right lower quadrant was prepped and draped in the usual sterile fashion. The overlying soft tissues were anesthetized with 1% lidocaine with epinephrine. Appropriate trajectory was planned with the use of a 22 gauge spinal needle. An 18 gauge trocar needle was advanced into the abscess/fluid collection and a short Amplatz super stiff wire was coiled within the collection. Appropriate positioning was confirmed with a limited CT scan. The tract was serially dilated allowing placement of a 10 [place] all-purpose drainage catheter. Appropriate positioning was confirmed with a limited postprocedural CT scan. Air was aspirated. The tube was connected to a bulb suction and sutured in place. A dressing was placed. The patient tolerated the procedure well without immediate post procedural complication. IMPRESSION: Successful CT guided placement of a 10 French all purpose drain catheter into the right  lower quadrant anterior abdominal wall collection. Marliss Coots, MD [facility] Electronically Signed   By:  Marliss Coots M.D.   On: 12/28/2023 16:01   CT ABDOMEN PELVIS W CONTRAST Result Date: 12/27/2023 CLINICAL DATA:  Abdominal pain, history of prior right lower quadrant diverticular abscess status post drainage EXAM: CT ABDOMEN AND PELVIS WITH CONTRAST TECHNIQUE: Multidetector CT imaging of the abdomen and pelvis was performed using the standard protocol following bolus administration of intravenous contrast. RADIATION DOSE REDUCTION: This exam was performed according to the departmental dose-optimization program which includes automated exposure control, adjustment of the mA and/or kV according to patient size and/or use of iterative reconstruction technique. CONTRAST:  OMNIPAQUE IOHEXOL 300 MG/ML  SOLN COMPARISON:  11/30/2023 FINDINGS: Lower chest: No acute pleural or parenchymal lung disease. Hepatobiliary: Hepatic steatosis. Prior cholecystectomy. No biliary duct dilation. Pancreas: Unremarkable. No pancreatic ductal dilatation or surrounding inflammatory changes. Spleen: Normal in size without focal abnormality. Adrenals/Urinary Tract: Stable bilateral renal cortical cysts do not require specific imaging follow-up. No urinary tract calculi or obstructive uropathy. The bladder is decompressed, limiting its evaluation. The adrenals are unremarkable. Stomach/Bowel: The extraluminal gas/fluid collection within the right lower quadrant is again identified. This appears to connect to both the cecum and the sigmoid colon, best seen on image 62/8. The intraperitoneal portion of the gas/fluid collection measures 2.8 x 2.6 cm reference image 65/2, with overall decrease in size since prior study but increased wall thickening. Once again, there is extension of the gas/fluid collection through the right lower quadrant abdominal wall into the subcutaneous fat. This collection now measures 4.6 x 5.4 cm, with minimal fluid component. There is increasing adjacent subcutaneous fat stranding within the right lower quadrant  abdominal wall and extending into the right flank. No bowel obstruction or ileus. Diverticulosis of the sigmoid colon again noted. Decreased bowel wall thickening within the sigmoid colon compatible with resolving diverticulitis. Vascular/Lymphatic: No significant vascular findings are present. No enlarged abdominal or pelvic lymph nodes. Reproductive: Mild enlargement the prostate unchanged. Other: Mesenteric fat stranding within the lower abdomen and pelvis. No free fluid or free intraperitoneal gas. Musculoskeletal: No acute or destructive bony abnormalities. Reconstructed images demonstrate no additional findings. IMPRESSION: 1. Complex gas/fluid collections within the right lower quadrant of the abdomen as well as the right lower quadrant anterior abdominal wall, with overall progression since prior study. These collections appear to arise from fistulous connections with the sigmoid colon and cecum. Surgical consultation is again recommended. 2. Increasing fat stranding within the right lower quadrant abdominal wall extending to the right flank, which could reflect developing cellulitis. 3. Persistent sigmoid diverticulosis, with improving sigmoid colon wall thickening compatible with resolving diverticulitis. 4. Hepatic steatosis. Electronically Signed   By: Sharlet Salina M.D.   On: 12/27/2023 15:08    Anti-infectives: Anti-infectives (From admission, onward)    Start     Dose/Rate Route Frequency Ordered Stop   12/28/23 1800  vancomycin (VANCOREADY) IVPB 1750 mg/350 mL        1,750 mg 175 mL/hr over 120 Minutes Intravenous Every 24 hours 12/27/23 1728     12/27/23 2200  piperacillin-tazobactam (ZOSYN) IVPB 3.375 g        3.375 g 12.5 mL/hr over 240 Minutes Intravenous Every 8 hours 12/27/23 1720     12/27/23 1730  vancomycin (VANCOCIN) IVPB 1000 mg/200 mL premix  Status:  Discontinued        1,000 mg 200 mL/hr over 60 Minutes Intravenous  Once 12/27/23 1720 12/27/23 1724   12/27/23 1730   vancomycin (VANCOREADY) IVPB 1750 mg/350 mL        1,750 mg 175 mL/hr over 120 Minutes Intravenous  Once 12/27/23 1725 12/27/23 1950   12/27/23 1600  piperacillin-tazobactam (ZOSYN) IVPB 3.375 g        3.375 g 100 mL/hr over 30 Minutes Intravenous  Once 12/27/23 1549 12/27/23 1639       Assessment/Plan: s/p * No surgery found * Advance diet. Allow liquids today Continue zosyn. Wbc normal Drain placed by IR. Will need to be studied at some point to see if and where it may communicate with colon Hx of diverticulitis with abscess s/p IR drain 11-10/2023 RLQ and RLQ abdominal wall abscesses vs developing colocutaneous fistula  - CT yesterday with above, also questions cellulitis of RLQ abdominal wall - this is not seen clinically on exam - IR consulted and planning drain placement today  - WBC 9.3 from 12.6, afebrile and HD stable  - no indication for emergent surgical intervention  - agree with continuing IV abx and drain placement  - surgery will continue to follow    FEN: NPO, IVF per [facility] VTE: LMWH ID: [email or web address]   - per [facility] -  GERD Hepatosteatosis  Will follow  LOS: 2 days    Chevis Pretty III 12/29/2023

## 2023-12-29 NOTE — Discharge Summary (Signed)
 Physician Discharge Summary  Henry Paul EAV:[id number] DOB: 1966/08/31 DOA: 12/27/2023  PCP: [name], MD  Admit date: 12/27/2023 Discharge date: 12/29/2023  Admitted From: Home Disposition: Home  Recommendations for Outpatient Follow-up:  Follow up with PCP in 1-2 weeks Follow-up with general surgery, Dr. Cliffton Asters as scheduled on 01/18/2024 Follow-up with gastroenterology, Dr. Loreta Ave as scheduled Follow-up with interventional radiology for management of intra-abdominal drain; likely 10-14 days post discharge for repeat imaging and possible drain injection Continue to flush drain daily with 5 mL normal saline, record output daily, dressing changes every 2-3 days or earlier if soiled Continue Augmentin to complete 14-day course  Home Health: No Equipment/Devices: Percutaneous intra-abdominal drain  Discharge Condition: Stable CODE STATUS: Full code Diet recommendation: Regular diet  History of present illness:  Henry Paul is a 58 year old male with past medical history significant for sigmoid diverticulitis with abscess  Who presented to [facility] ED on 12/27/2023 with acute worsening of his right lower quadrant abdominal pain.  Patient recently underwent percutaneous catheter drainage of right lower quadrant peritoneal abscess on 09/29/2023; completed antibiotic course, and drain removed on 10/13/2023.  Following drain removal, patient had follow-up CT scan that showed enlargement of the abscess in the right lower quadrant and was told if he had any increase in tenderness should represent to the ED for further evaluation.  In the ED, temperature 98.0 F, HR 89, RR 19, BP 153/95, SpO2 99% on room air.  WBC 12.6, hemoglobin 14.4, platelet count 312.  Sodium 140, potassium 4.1, chloride 105, CO2 22, glucose 113, BUN 17, creat 1.23.  Lipase 33.  AST 21, ALT 26, total bilirubin 0.8.  Urinalysis negative.  CT abdomen/pelvis with complex gas/fluid collections right lower  quadrant as well as right lower quadrant anterior abdominal wall with overall progression since prior studies, concern from arising from a fistulous connections with the sigmoid colon and cecum, increasing fat stranding within the right lower quadrant abdominal wall extending to the right flank which could reflect developing cellulitis, persistent sigmoid diverticulosis with improvement sigmoid colon wall thickening compatible with resolving diverticulitis, hepatic steatosis.  General surgery was consulted.  Patient was started on empiric antibiotics with vancomycin and Zosyn.  TRH consulted for admission for further evaluation management of recurrent diverticular abscess with abdominal wall abscess.  Hospital course:  Right lower quadrant abdominal wall abscess Concern for developing colocutaneous fistula Patient presenting to ED with acute recurrent right lower quadrant abdominal pain.  Recent RLQ peritoneal abscess in which she completed antibiotic course with percutaneously drain placement that was since discontinued.  Patient was afebrile with elevated WBC count of 12.6.  CT abdomen/pelvis with complex gas/fluid collections right lower quadrant as well as right lower quadrant anterior abdominal wall with overall progression since prior studies, concern from arising from a fistulous connections with the sigmoid colon and cecum, increasing fat stranding within the right lower quadrant abdominal wall extending to the right flank which could reflect developing cellulitis, persistent sigmoid diverticulosis with improvement sigmoid colon wall thickening compatible with resolving diverticulitis.  General surgery was consulted and recommended IR drain placement.  Patient underwent percutaneous drain placement on 12/28/2023, although no samples were obtained for culture at time of procedure.  Patient was initially started on empiric antibiotics with vancomycin and Zosyn.  Patient's WBC count improved.  Patient will  discharge on Augmentin 875-125 mg p.o. twice daily x 14 days.  Patient is outpatient follow-up with general surgery, Dr. Cliffton Asters on 01/18/2024.  Outpatient follow-up with interventional  radiology for drain management.  Continue drain flushes with 5 mL once daily, needs to record daily output and dressing changes every 3 days or if becomes soiled.  BPH Tamsulosin 0.4 mg p.o. nightly  Essential hypertension Metoprolol tartrate 25 mg p.o. twice daily  GERD Protonix 20 mg p.o. daily.  Discharge Diagnoses:  Principal Problem:   Intra-abdominal infection Active Problems:   Abdominal fistula   Leukocytosis   Intra-abdominal abscess [facility])    Discharge Instructions  Discharge Instructions     Call MD for:  difficulty breathing, headache or visual disturbances   Complete by: As directed    Call MD for:  extreme fatigue   Complete by: As directed    Call MD for:  persistant dizziness or light-headedness   Complete by: As directed    Call MD for:  persistant nausea and vomiting   Complete by: As directed    Call MD for:  severe uncontrolled pain   Complete by: As directed    Call MD for:  temperature >100.4   Complete by: As directed    Diet - low sodium heart healthy   Complete by: As directed    Increase activity slowly   Complete by: As directed       Allergies as of 12/29/2023   No Known Allergies      Medication List     TAKE these medications    acetaminophen 500 MG tablet Commonly known as: TYLENOL Take 1,000 mg by mouth every 6 (six) hours as needed for mild pain (pain score 1-3) or moderate pain (pain score 4-6).   amoxicillin-clavulanate 875-125 MG tablet Commonly known as: AUGMENTIN Take 1 tablet by mouth 2 (two) times daily for 14 days.   cetirizine 10 MG tablet Commonly known as: ZYRTEC Take 10 mg by mouth at bedtime.   docusate sodium 100 MG capsule Commonly known as: COLACE Take 100 mg by mouth See admin instructions. Take 100 mg by mouth once a day  and an additional 100 mg up to two times daily as needed for constipation   metoprolol tartrate 25 MG tablet Commonly known as: LOPRESSOR Take 1 tablet (25 mg total) by mouth 2 (two) times daily.   Normal Saline Flush 0.9 % Soln 5 mLs by Other route daily.   pantoprazole 20 MG tablet Commonly known as: PROTONIX Take 20 mg by mouth at bedtime.   tamsulosin 0.4 MG Caps capsule Commonly known as: FLOMAX Take 0.4 mg by mouth at bedtime.   Tylenol PM Extra Strength 500-25 MG Tabs tablet Generic drug: diphenhydramine-acetaminophen Take 2 tablets by mouth at bedtime.        Follow-up Information     [name], MD. Schedule an appointment as soon as possible for a visit in 1 week(s).   Specialty: Internal Medicine Contact information: [address] [place] [address] [phone]         Andria Meuse, MD. Go on 01/18/2024.   Specialties: General Surgery, Colon and Rectal Surgery Contact information: [address] [place] [address] [phone]         Charna Elizabeth, MD Follow up.   Specialty: Gastroenterology Contact information: [address] [place] [address] [phone]         [facility] Follow up.   Contact information: [address] [place] [address] [phone]  No Known Allergies  Consultations: General surgery And radiology   Procedures/Studies: CT GUIDED PERITONEAL/RETROPERITONEAL FLUID DRAIN BY PERC CATH Result Date: 12/28/2023 INDICATION: 58 year old male with history of recurrent right lower quadrant anterior abdominal gas containing abscess. EXAM: CT PERC DRAIN PERITONEAL ABCESS COMPARISON:  None Available. MEDICATIONS: The patient is currently admitted to the hospital and receiving intravenous antibiotics. The antibiotics were administered within an appropriate time frame prior to the initiation of the  procedure. ANESTHESIA/SEDATION: Moderate (conscious) sedation was employed during this procedure. A total of Versed 4 mg and Fentanyl 100 mcg was administered intravenously. Moderate Sedation Time: 14 minutes. The patient's level of consciousness and vital signs were monitored continuously by radiology nursing throughout the procedure under my direct supervision. CONTRAST:  None COMPLICATIONS: None immediate. PROCEDURE: RADIATION DOSE REDUCTION: This exam was performed according to the departmental dose-optimization program which includes automated exposure control, adjustment of the mA and/or kV according to patient size and/or use of iterative reconstruction technique. Informed written consent was obtained from the patient after a discussion of the risks, benefits and alternatives to treatment. The patient was placed supine on the CT gantry and a pre procedural CT was performed re-demonstrating the known abscess/fluid collection within the right lower quadrant. The procedure was planned. A timeout was performed prior to the initiation of the procedure. The right lower quadrant was prepped and draped in the usual sterile fashion. The overlying soft tissues were anesthetized with 1% lidocaine with epinephrine. Appropriate trajectory was planned with the use of a 22 gauge spinal needle. An 18 gauge trocar needle was advanced into the abscess/fluid collection and a short Amplatz super stiff wire was coiled within the collection. Appropriate positioning was confirmed with a limited CT scan. The tract was serially dilated allowing placement of a 10 [place] all-purpose drainage catheter. Appropriate positioning was confirmed with a limited postprocedural CT scan. Air was aspirated. The tube was connected to a bulb suction and sutured in place. A dressing was placed. The patient tolerated the procedure well without immediate post procedural complication. IMPRESSION: Successful CT guided placement of a 10 French all purpose  drain catheter into the right lower quadrant anterior abdominal wall collection. Marliss Coots, MD [facility] Electronically Signed   By: Marliss Coots M.D.   On: 12/28/2023 16:01   CT ABDOMEN PELVIS W CONTRAST Result Date: 12/27/2023 CLINICAL DATA:  Abdominal pain, history of prior right lower quadrant diverticular abscess status post drainage EXAM: CT ABDOMEN AND PELVIS WITH CONTRAST TECHNIQUE: Multidetector CT imaging of the abdomen and pelvis was performed using the standard protocol following bolus administration of intravenous contrast. RADIATION DOSE REDUCTION: This exam was performed according to the departmental dose-optimization program which includes automated exposure control, adjustment of the mA and/or kV according to patient size and/or use of iterative reconstruction technique. CONTRAST:  OMNIPAQUE IOHEXOL 300 MG/ML  SOLN COMPARISON:  11/30/2023 FINDINGS: Lower chest: No acute pleural or parenchymal lung disease. Hepatobiliary: Hepatic steatosis. Prior cholecystectomy. No biliary duct dilation. Pancreas: Unremarkable. No pancreatic ductal dilatation or surrounding inflammatory changes. Spleen: Normal in size without focal abnormality. Adrenals/Urinary Tract: Stable bilateral renal cortical cysts do not require specific imaging follow-up. No urinary tract calculi or obstructive uropathy. The bladder is decompressed, limiting its evaluation. The adrenals are unremarkable. Stomach/Bowel: The extraluminal gas/fluid collection within the right lower quadrant is again identified. This appears to connect to both the cecum and the sigmoid colon, best seen on image 62/8. The intraperitoneal portion of the gas/fluid  collection measures 2.8 x 2.6 cm reference image 65/2, with overall decrease in size since prior study but increased wall thickening. Once again, there is extension of the gas/fluid collection through the right lower quadrant  abdominal wall into the subcutaneous fat. This collection now measures 4.6 x 5.4 cm, with minimal fluid component. There is increasing adjacent subcutaneous fat stranding within the right lower quadrant abdominal wall and extending into the right flank. No bowel obstruction or ileus. Diverticulosis of the sigmoid colon again noted. Decreased bowel wall thickening within the sigmoid colon compatible with resolving diverticulitis. Vascular/Lymphatic: No significant vascular findings are present. No enlarged abdominal or pelvic lymph nodes. Reproductive: Mild enlargement the prostate unchanged. Other: Mesenteric fat stranding within the lower abdomen and pelvis. No free fluid or free intraperitoneal gas. Musculoskeletal: No acute or destructive bony abnormalities. Reconstructed images demonstrate no additional findings. IMPRESSION: 1. Complex gas/fluid collections within the right lower quadrant of the abdomen as well as the right lower quadrant anterior abdominal wall, with overall progression since prior study. These collections appear to arise from fistulous connections with the sigmoid colon and cecum. Surgical consultation is again recommended. 2. Increasing fat stranding within the right lower quadrant abdominal wall extending to the right flank, which could reflect developing cellulitis. 3. Persistent sigmoid diverticulosis, with improving sigmoid colon wall thickening compatible with resolving diverticulitis. 4. Hepatic steatosis. Electronically Signed   By: Sharlet Salina M.D.   On: 12/27/2023 15:08   CT ABDOMEN PELVIS W CONTRAST Result Date: 11/30/2023 CLINICAL DATA:  History of prior percutaneous catheter drainage of right lower quadrant diverticular abscess last November with removal of the drain on 10/13/2023. Recent hospitalization for recurrent abscess which did not require additional drainage procedure and was treated with antibiotics. EXAM: CT ABDOMEN AND PELVIS WITH CONTRAST TECHNIQUE:  Multidetector CT imaging of the abdomen and pelvis was performed using the standard protocol following bolus administration of intravenous contrast. RADIATION DOSE REDUCTION: This exam was performed according to the departmental dose-optimization program which includes automated exposure control, adjustment of the mA and/or kV according to patient size and/or use of iterative reconstruction technique. CONTRAST:  ISOVUE-300 IOPAMIDOL (ISOVUE-300) INJECTION 61% COMPARISON:  11/15/2023 as well as other prior studies. FINDINGS: Lower chest: No acute abnormality. Hepatobiliary: No focal liver abnormality is seen. Status post cholecystectomy. No biliary dilatation. Pancreas: Unremarkable. No pancreatic ductal dilatation or surrounding inflammatory changes. Spleen: Normal in size without focal abnormality. Adrenals/Urinary Tract: Adrenal glands are unremarkable. Kidneys are normal, without renal calculi, focal lesion, or hydronephrosis. Stable bilateral Bosniak 1 simple cysts of both kidneys require no follow-up. Bladder is unremarkable. Stomach/Bowel: The loculations of free intraperitoneal air seen on the prior study have resolved. In the right lower quadrant, the complex air and fluid collection seen previously now appears to be almost entirely a region of extraluminal air with a dominant pocket abutting the abdominal wall measuring approximately 4 cm in maximum diameter. There also is new herniation of air through the abdominal wall extending just into the subcutaneous fat outside of the rectus musculature measuring 3.3 cm in diameter. Exact source of this air is again somewhat difficult to determine and may either be from the cecal tip, distal small bowel or sigmoid colon. There is stable wall thickening of the sigmoid colon. Inflammation in the pelvic mesenteric fat appears slightly improved compared to the prior study with some persistent inflammation remaining. The appendix is been removed.  Vascular/Lymphatic: No significant vascular findings are present. No enlarged abdominal or pelvic lymph nodes. Reproductive:  Prostate is unremarkable. Other: Mild inflammatory changes in the right lower abdominal wall subcutaneous fat. Musculoskeletal: No acute or significant osseous findings. IMPRESSION: 1. The loculations of free intraperitoneal air seen on the prior study have resolved. 2. The complex air and fluid collection seen previously in the right lower quadrant now appears to be almost entirely a region of extraluminal air with a dominant pocket abutting the abdominal wall measuring approximately 4 cm in maximum diameter. There also is new herniation of air through the abdominal wall extending just into the subcutaneous fat outside of the rectus musculature measuring 3.3 cm in diameter. Exact source of this air is again somewhat difficult to determine and may either be from the cecal tip, distal small bowel or sigmoid colon. Surgical follow-up is recommended. 3. Stable wall thickening of the sigmoid colon. Inflammation in the pelvic mesenteric fat appears slightly improved compared to the prior study with some persistent inflammation remaining. 4. Stable bilateral Bosniak 1 simple cysts of both kidneys require no follow-up. Electronically Signed   By: Irish Lack M.D.   On: 11/30/2023 16:27     Subjective: Patient seen examined bedside, sitting in bedside chair.  No complaints this morning.  Pain controlled.  States ready for discharge home.  Discussed with general surgery, Dr. Carolynne Edouard; K for discharge from their standpoint on oral antibiotics.  Contacted interventional radiology for outpatient follow-up.  Patient with no other specific questions, concerns or complaints at this time.  Denies headache, no dizziness, no chest pain, no palpitations, no shortness of breath, no current abdominal pain, no fever/chills/night sweats, no nausea/vomiting/diarrhea, no focal weakness, no fatigue, no  paresthesias.  No acute events overnight per nursing.  Discharge Exam: Vitals:   12/29/23 0330 12/29/23 0857  BP: 123/86 109/80  Pulse: 94 87  Resp: 18   Temp: 98.5 F (36.9 C)   SpO2: 95%    Vitals:   12/28/23 1537 12/28/23 2030 12/29/23 0330 12/29/23 0857  BP: 110/83 124/79 123/86 109/80  Pulse: 76 85 94 87  Resp: 18 18 18    Temp: 97.8 F (36.6 C) 98.1 F (36.7 C) 98.5 F (36.9 C)   TempSrc: Oral Oral Oral   SpO2: 96% 98% 95%   Weight:      Height:        Physical Exam: GEN: NAD, alert and oriented x 3, wd/wn HEENT: NCAT, PERRL, EOMI, sclera clear, MMM PULM: CTAB w/o wheezes/crackles, normal respiratory effort, on room air CV: RRR w/o M/G/R GI: abd soft, NTND, NABS, no R/G/M, right lower quadrant percutaneous drain noted with dark brown output MSK: no peripheral edema, muscle strength globally intact 5/5 bilateral upper/lower extremities NEURO: CN II-XII intact, no focal deficits, sensation to light touch intact PSYCH: normal mood/affect Integumentary: dry/intact, no rashes or wounds    The results of significant diagnostics from this hospitalization (including imaging, microbiology, ancillary and laboratory) are listed below for reference.     Microbiology: No results found for this or any previous visit (from the past 240 hours).   Labs: BNP (last 3 results) No results for input(s): "BNP" in the last 8760 hours. Basic Metabolic Panel: Recent Labs  Lab 12/27/23 0647 12/28/23 0350 12/29/23 0417  NA 140 136  --   K 4.1 4.0  --   CL 105 101  --   CO2 22 23  --   GLUCOSE 113* 95  --   BUN 17 14  --   CREATININE 1.23 1.00 0.98  CALCIUM 9.9 9.2  --  Liver Function Tests: Recent Labs  Lab 12/27/23 0647  AST 21  ALT 26  ALKPHOS 57  BILITOT 0.8  PROT 7.7  ALBUMIN 4.2   Recent Labs  Lab 12/27/23 0647  LIPASE 33   No results for input(s): "AMMONIA" in the last 168 hours. CBC: Recent Labs  Lab 12/27/23 0647 12/28/23 0350  WBC 12.6* 9.3   NEUTROABS  --  6.2  HGB 14.4 12.6*  HCT 44.1 38.6*  MCV 89.5 90.0  PLT 312 235   Cardiac Enzymes: No results for input(s): "CKTOTAL", "CKMB", "CKMBINDEX", "TROPONINI" in the last 168 hours. BNP: Invalid input(s): "POCBNP" CBG: No results for input(s): "GLUCAP" in the last 168 hours. D-Dimer No results for input(s): "DDIMER" in the last 72 hours. Hgb A1c No results for input(s): "HGBA1C" in the last 72 hours. Lipid Profile No results for input(s): "CHOL", "HDL", "LDLCALC", "TRIG", "CHOLHDL", "LDLDIRECT" in the last 72 hours. Thyroid function studies No results for input(s): "TSH", "T4TOTAL", "T3FREE", "THYROIDAB" in the last 72 hours.  Invalid input(s): "FREET3" Anemia work up No results for input(s): "VITAMINB12", "FOLATE", "FERRITIN", "TIBC", "IRON", "RETICCTPCT" in the last 72 hours. Urinalysis    Component Value Date/Time   COLORURINE YELLOW 12/27/2023 0958   APPEARANCEUR CLEAR 12/27/2023 0958   LABSPEC 1.020 12/27/2023 0958   PHURINE 6.0 12/27/2023 0958   GLUCOSEU NEGATIVE 12/27/2023 0958   HGBUR NEGATIVE 12/27/2023 0958   BILIRUBINUR NEGATIVE 12/27/2023 0958   KETONESUR NEGATIVE 12/27/2023 0958   PROTEINUR NEGATIVE 12/27/2023 0958   NITRITE NEGATIVE 12/27/2023 0958   LEUKOCYTESUR NEGATIVE 12/27/2023 0958   Sepsis Labs Recent Labs  Lab 12/27/23 0647 12/28/23 0350  WBC 12.6* 9.3   Microbiology No results found for this or any previous visit (from the past 240 hours).   Time coordinating discharge: Over 30 minutes  SIGNED:   Alvira Philips [place], DO  [facility] 12/29/2023, 12:26 PM

## 2023-12-29 NOTE — Discharge Instructions (Signed)
 Interventional Radiology Percutaneous Abscess Drain Placement After Care   This sheet gives you information about how to care for yourself after your procedure. Your health care provider may also give you more specific instructions. Your drain was placed by an interventional radiologist with [facility]. If you have questions or concerns, contact [facility] at [phone].   What is a percutaneous drain?   A drain is a small plastic tube (catheter) that goes into the fluid collection in your body through your skin.   How long will I need the drain?   How long the drain needs to stay in is determined by where the drain is, how much comes out of the drain each day and if you are having any other surgical procedures.   Interventional radiology will determine when it is time to remove the drain. It is important to follow up as directed so that the drain can be removed as soon as it is safe to do so.   What can I expect after the procedure?   After the procedure, it is common to have:   A small amount of bruising and discomfort in the area where the drainage tube (catheter) was placed.   Sleepiness and fatigue. This should go away after the medicines you were given have worn off.   Follow these instructions at home:   Insertion site care   Check your insertion site when you change the bandage. Check for:   More redness, swelling, or pain.   More fluid or blood.   Warmth.   Pus or a bad smell.   When caring for your insertion site:   Wash your hands with soap and water for at least 20 seconds before and after you change your bandage (dressing). If soap and water are not available, use hand sanitizer.   You do not need to change your dressing everyday if it is clean and dry. Change your dressing every 3 days or as needed when it is soiled, wet or becoming dislodged. You will need to change your dressing each time you shower.   Leave stitches (sutures), skin  glue, or adhesive strips in place. These skin closures may need to stay in place for 2 weeks or longer. If adhesive strip edges start to loosen and curl up, you may trim the loose edges. Do not remove adhesive strips completely unless your health care provider tells you to do so.   Catheter care   Flush the catheter once per day with 5 mL of 0.9% normal saline unless you are told otherwise by your healthcare provider. This helps to prevent clogs in the catheter.   To disconnect the drain, turn the clear plastic tube to the left. Attach the saline syringe by placing it on the white end of the drain and turning gently to the right. Once attached gently push the plunger to the 5 mL mark. After you are done flushing, disconnect the syringe by turning to the left and reattach your drainage container   If you have a bulb please be sure the bulb is charged after reconnecting it - to do this pinch the bulb between your thumb and first finger and close the stopper located on the top of the bulb.    Check for fluid leaking from around your catheter (instead of fluid draining through your catheter). This may be a sign that the drain is no longer working correctly.   Write down the following information every time you empty your  bag:   The date and time.   The amount of drainage.   Activity   Rest at home for 1-2 days after your procedure.   For the first 48 hours do not lift anything more than 10 lbs (about a gallon of milk). You may perform moderate activities/exercise. Please avoid strenuous activities during this time.   Avoid any activities which may pull on your drain as this can cause your drain to become dislodged.   If you were given a sedative during the procedure, it can affect you for several hours. Do not drive or operate machinery until your health care provider says that it is safe.   General instructions   For mild pain take over-the-counter medications as needed for pain such as  Tylenol or Advil. If you are experiencing severe pain please call our office as this may indicate an issue with your drain.    If you were prescribed an antibiotic medicine, take it as told by your health care provider. Do not stop using the antibiotic even if you start to feel better.   You may shower 24 hours after the drain is placed. To do this cover the insertion site with a water tight material such as saran wrap and seal the edges with tape, you may also purchase waterproof dressings at your local drug store. Shower as usual and then remove the water tight dressing and any gauze/tape underneath it once you have exited the shower and dried off. Allow the area to air dry or pat dry with a clean towel. Once the skin is completely dry place a new gauze dressing. It is important to keep the site dry at all times to prevent infection.   Do not submerge the drain - this means you cannot take baths, swim, use a hot tub, etc. until the drain is removed.    Do not use any products that contain nicotine or tobacco, such as cigarettes, e-cigarettes, and chewing tobacco. If you need help quitting, ask your health care provider.   Keep all follow-up visits as told by your health care provider. This is important.   Contact a health care provider if:   You have less than 10 mL of drainage a day for 2-3 days in a row, or as directed by your health care provider.   You have any of these signs of infection:   More redness, swelling, or pain around your incision area.   More fluid or blood coming from your incision area.   Warmth coming from your incision area.   Pus or a bad smell coming from your incision area.   You have fluid leaking from around your catheter (instead of through your catheter).   You are unable to flush the drain.   You have a fever or chills.   You have pain that does not get better with medicine.   You have not been contacted to schedule a drain follow up appointment  within 10 days of discharge from the hospital.   Please call [facility] at [phone] with any questions or concerns.   Get help right away if:   Your catheter comes out.   You suddenly stop having drainage from your catheter.   You suddenly have blood in the fluid that is draining from your catheter.   You become dizzy or you faint.   You develop a rash.   You have nausea or vomiting.   You have difficulty breathing or you feel short  of breath.   You develop chest pain.   You have problems with your speech or vision.   You have trouble balancing or moving your arms or legs.   Summary   It is common to have a small amount of bruising and discomfort in the area where the drainage tube (catheter) was placed. You may also have minor discomfort with movement while the drain is in place.   Flush the drain once per day with 5 mL of 0.9% normal saline (unless you were told otherwise by your healthcare provider).    Record the amount of drainage from the bag every time you empty it.   Change the dressing every 3 days or earlier if soiled/wet. Keep the skin dry under the dressing.   You may shower with the drain in place. Do not submerge the drain (no baths, swimming, hot tubs, etc.).   Contact [facility] at [phone] if you have more redness, swelling, or pain around your incision area or if you have pain that does not get better with medicine.   This information is not intended to replace advice given to you by your health care provider. Make sure you discuss any questions you have with your health care provider.   Document Revised: 01/22/2022 Document Reviewed: 10/14/2019   Elsevier Patient Education  2023 [employer]vier Inc[employer]         Interventional Radiology Drain Record   Empty your drain at least once per day. You may empty it as often as needed. Use this form to write down the amount of fluid that has collected in the drainage container. Bring  this form with you to your follow-up visits. Please call [facility] at [phone] with any questions or concerns prior to your appointment.   Drain #1 location: ___________________   Date __________ Time __________ Amount __________   Date __________ Time __________ Amount __________   Date __________ Time __________ Amount __________   Date __________ Time __________ Amount __________   Date __________ Time __________ Amount __________   Date __________ Time __________ Amount __________   Date __________ Time __________ Amount __________   Date __________ Time __________ Amount __________   Date __________ Time __________ Amount __________   Date __________ Time __________ Amount __________   Date __________ Time __________ Amount __________   Date __________ Time __________ Amount __________   Date __________ Time __________ Amount __________   Date __________ Time __________ Amount __________

## 2023-12-29 NOTE — Progress Notes (Signed)
 Referring Provider(s): Chevis Pretty; Blake Divine, Seth Bake   Supervising Physician: Roanna Banning  Patient Status:  [facility] - In-pt  Chief Complaint:  recurrent right lower quadrant diverticular abscess - s/p image guided right lower quadrant drain placement 12/28/23 with Dr. Elby Showers   Subjective:  Pt states he is feeling well today and enjoyed his breakfast  He is up and walking, staying out of the bed and in the chair   Allergies: Patient has no known allergies.  Medications: Prior to Admission medications   Medication Sig Start Date End Date Taking? Authorizing Provider  acetaminophen (TYLENOL) 500 MG tablet Take 1,000 mg by mouth every 6 (six) hours as needed for mild pain (pain score 1-3) or moderate pain (pain score 4-6).   Yes [provider]  cetirizine (ZYRTEC) 10 MG tablet Take 10 mg by mouth at bedtime.   Yes [provider]  docusate sodium (COLACE) 100 MG capsule Take 100 mg by mouth See admin instructions. Take 100 mg by mouth once a day and an additional 100 mg up to two times daily as needed for constipation   Yes [provider]  metoprolol tartrate (LOPRESSOR) 25 MG tablet Take 1 tablet (25 mg total) by mouth 2 (two) times daily. 11/19/23  Yes Shah, Pratik D, DO  pantoprazole (PROTONIX) 20 MG tablet Take 20 mg by mouth at bedtime. 04/02/21  Yes [provider]  tamsulosin (FLOMAX) 0.4 MG CAPS capsule Take 0.4 mg by mouth at bedtime. 03/24/21  Yes [provider]  TYLENOL PM EXTRA STRENGTH 500-25 MG TABS tablet Take 2 tablets by mouth at bedtime.   Yes [provider]     Vital Signs: BP 109/80   Pulse 87   Temp 98.5 F (36.9 C) (Oral)   Resp 18   Ht 5\' 9"  (1.753 m)   Wt 169 lb 15.6 oz (77.1 kg)   SpO2 95%   BMI 25.10 kg/m   Physical Exam Constitutional:      Appearance: Normal appearance.  Skin:    Comments: Right lower quadrant drain F/A easily  Skin clean, dry, without signs of infection, statlock and suture in  place  Output 10 mL dark red/brown   Neurological:     Mental Status: He is alert and oriented to person, place, and time.  Psychiatric:        Mood and Affect: Mood normal.        Behavior: Behavior normal.      Labs:  CBC: Recent Labs    11/18/23 0417 11/19/23 0458 12/27/23 0647 12/28/23 0350  WBC 12.1* 9.6 12.6* 9.3  HGB 13.6 12.8* 14.4 12.6*  HCT 39.5 37.7* 44.1 38.6*  PLT 283 282 312 235    COAGS: Recent Labs    09/29/23 1037 11/15/23 1709 12/28/23 0840  INR 1.1 1.1 1.0  APTT  --  31  --     BMP: Recent Labs    11/18/23 0417 11/19/23 0458 12/27/23 0647 12/28/23 0350 12/29/23 0417  NA 133* 135 140 136  --   K 3.7 4.0 4.1 4.0  --   CL 100 103 105 101  --   CO2 21* 21* 22 23  --   GLUCOSE 111* 101* 113* 95  --   BUN 13 17 17 14   --   CALCIUM 8.8* 8.8* 9.9 9.2  --   CREATININE 1.03 0.95 1.23 1.00 0.98  GFRNONAA >60 >60 >60 >60 >60    LIVER FUNCTION TESTS: Recent Labs  09/24/23 1122 11/15/23 1554 11/16/23 0437 12/27/23 0647  BILITOT 1.9* 1.4* 1.4* 0.8  AST 22 15 17 21   ALT 38 26 22 26   ALKPHOS 44 50 48 57  PROT 7.6 7.6 6.5 7.7  ALBUMIN 4.3 4.7 3.5 4.2    Assessment and Plan:   s/p image guided right lower quadrant drain placement 12/28/23 with Dr. Melonie Florida Location: RLQ Size: Fr size: 10 Fr Date of placement: 12/28/23  Currently to: Drain collection device: suction bulb 24 hour output:  Output by Drain (mL) 12/27/23 0701 - 12/27/23 1900 12/27/23 1901 - 12/28/23 0700 12/28/23 0701 - 12/28/23 1900 12/28/23 1901 - 12/29/23 0700 12/29/23 0701 - 12/29/23 0926  Closed System Drain 1 Right RLQ Bulb (JP) 10 Fr.   10 20     Interval imaging/drain manipulation:  None   Current examination: Flushes/aspirates easily.  Insertion site unremarkable. Suture and stat lock in place. Dressed appropriately.   Plan: Continue TID flushes with 5 cc NS. Record output Q shift. Dressing changes QD or PRN if soiled.  Call IR APP or on call IR  MD if difficulty flushing or sudden change in drain output.  Repeat imaging/possible drain injection once output < 10 mL/QD (excluding flush material). Consideration for drain removal if output is < 10 mL/QD (excluding flush material), pending discussion with the providing surgical service.  Discharge planning: Please contact IR APP or on call IR MD prior to patient d/c to ensure appropriate follow up plans are in place. Typically patient will follow up with IR clinic 10-14 days post d/c for repeat imaging/possible drain injection. IR scheduler will contact patient with date/time of appointment. Patient will need to flush drain QD with 5 cc NS, record output QD, dressing changes every 2-3 days or earlier if soiled.   IR will continue to follow - please call with questions or concerns.   Electronically Signed: Loman Brooklyn, PA-C 12/29/2023, 9:24 AM    I spent a total of 15 Minutes at the the patient's bedside AND on the patient's hospital floor or unit, greater than 50% of which was counseling/coordinating care for image guided right lower quadrant drain placement

## 2023-12-30 ENCOUNTER — Other Ambulatory Visit: Payer: Self-pay | Admitting: General Surgery

## 2023-12-30 DIAGNOSIS — K651 Peritoneal abscess: Secondary | ICD-10-CM

## 2024-01-05 ENCOUNTER — Ambulatory Visit
Admission: RE | Admit: 2024-01-05 | Discharge: 2024-01-05 | Disposition: A | Discharge status: Home / Self Care | Source: Ambulatory Visit | Attending: General Surgery | Admitting: General Surgery

## 2024-01-05 DIAGNOSIS — K651 Peritoneal abscess: Secondary | ICD-10-CM

## 2024-01-05 MED ORDER — IOPAMIDOL (ISOVUE-300) INJECTION 61%
200.0000 mL | Freq: Once | INTRAVENOUS | Status: AC | PRN
  Administered 2024-01-05: 100 mL via INTRAVENOUS

## 2024-01-06 ENCOUNTER — Other Ambulatory Visit: Payer: Self-pay | Admitting: General Surgery

## 2024-01-06 ENCOUNTER — Ambulatory Visit (HOSPITAL_COMMUNITY)
Admission: RE | Admit: 2024-01-06 | Discharge: 2024-01-06 | Disposition: A | Discharge status: Home / Self Care | Source: Ambulatory Visit | Attending: General Surgery | Admitting: General Surgery

## 2024-01-06 DIAGNOSIS — K651 Peritoneal abscess: Secondary | ICD-10-CM

## 2024-01-06 HISTORY — PX: IR CV LINE INJECTION: IMG2294

## 2024-01-06 MED ORDER — IOHEXOL 300 MG/ML  SOLN
50.0000 mL | Freq: Once | INTRAMUSCULAR | Status: AC | PRN
  Administered 2024-01-06: 10 mL

## 2024-01-09 ENCOUNTER — Encounter: Payer: Self-pay | Admitting: Pulmonary Disease

## 2024-01-18 ENCOUNTER — Telehealth (HOSPITAL_COMMUNITY): Payer: Self-pay | Admitting: Radiology

## 2024-01-18 NOTE — Telephone Encounter (Signed)
 Patient called IR today with concerns about his drain. Patient reports that he had a drain study early march, with evidence of a fistula. He had the JP drain switched to a gravity bag and was informed not to flush the drain. Patient reports abdominal pain and nausea. He reports the drain is not draining at all. He notes having an appointment with his surgeon around 11 am today. Patient informed to see his surgeon first and contact us after if his surgeon feels like intervention needs to be done. If so, we can try to get him scheduled before his original follow up this Friday. However, patient was informed to present to the emergency room if he has fever, chills, vomiting, and/or increased abdominal pain. Patient agreed with the plan.

## 2024-01-19 ENCOUNTER — Other Ambulatory Visit: Payer: 59

## 2024-01-20 NOTE — Progress Notes (Signed)
 Referring Physician(s): Toth,Paul III   Chief Complaint: The patient is seen in follow up today s/p 12/28/23  History of present illness: Henry Paul, 58 year old male, has a medical history significant for BPH and recurrent diverticulitis. His history of diverticulitis goes back many years with several instances occurring each year. He is familiar to IR from a diverticular abscess drain placed 09/29/23 with removal 10/13/23 when imaging showed resolution of the abscess and no fistula to the bowel. He unfortunately returned to the ED 12/27/23 with right lower quadrant pain and was found to have a recurrence of the abscess. A new RLQ drain was placed 12/28/23.   He was discharged home from the hospital 12/29/23 and his drain was evaluated 01/06/24 with CT imaging which showed a significant decrease in the size of the fluid collection. 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 The drain was switched from bulb suction to bag drainage and he was instructed to stop flushing the drain.    He called IR 01/18/24 with complaints of abdominal pain, nausea and minimal output from the drain. His symptoms were described as mild/vague and not indicative of needing immediate evaluation. He had an appointment with his surgeon that same day and he was advised to keep that appointment. He met with Dr. Cliffton Asters and denied fevers, chills, nausea and vomiting. He reported intermittent vague lower abdominal cramps/aches. Dr. Cliffton Asters discussed proceeding with surgical intervention at some point in the near future. He is scheduled for a colonoscopy 01/06/24.   He presents today to the IR outpatient clinic for a drain evaluation. He has had nearly no drainage output from the drain over the past several days.  He feels well with no new fevers, chills, abdominal pain.  Past Medical History:  Diagnosis Date   Fatty liver    GERD  (gastroesophageal reflux disease)     Past Surgical History:  Procedure Laterality Date   APPENDECTOMY     CHOLECYSTECTOMY N/A 04/17/2021   Procedure: LAPAROSCOPIC CHOLECYSTECTOMY;  Surgeon: Abigail Miyamoto, MD;  Location: [facility];  Service: General;  Laterality: N/A;   COLONOSCOPY     FINGER SURGERY Left    ring and middle fingers reatttachment after amputation   IR CV LINE INJECTION  01/06/2024   IR RADIOLOGIST EVAL & MGMT  10/13/2023   WISDOM TOOTH EXTRACTION      Allergies: Patient has no known allergies.  Medications: Prior to Admission medications   Medication Sig Start Date End Date Taking? Authorizing Provider  acetaminophen (TYLENOL) 500 MG tablet Take 1,000 mg by mouth every 6 (six) hours as needed for mild pain (pain score 1-3) or moderate pain (pain score 4-6).    [provider]  cetirizine (ZYRTEC) 10 MG tablet Take 10 mg by mouth at bedtime.    [provider]  docusate sodium (COLACE) 100 MG capsule Take 100 mg by mouth See admin instructions. Take 100 mg by mouth once a day and an additional 100 mg up to two times daily as needed for constipation    [provider]  metoprolol tartrate (LOPRESSOR) 25 MG tablet Take 1 tablet (25 mg total) by mouth 2 (two) times daily. 11/19/23   Sherryll Burger, Pratik D, DO  pantoprazole (PROTONIX) 20 MG tablet Take 20 mg by mouth at bedtime. 04/02/21   [provider]  Sodium Chloride Flush (NORMAL SALINE FLUSH) 0.9 % SOLN 5 mLs by Other route daily. 12/29/23 03/28/24  [place], Eric  J, DO  tamsulosin (FLOMAX) 0.4 MG CAPS capsule Take 0.4 mg by mouth at bedtime. 03/24/21   [provider]  TYLENOL PM EXTRA STRENGTH 500-25 MG TABS tablet Take 2 tablets by mouth at bedtime.    [provider]     No family history on file.  Social History   Socioeconomic History   Marital status: Married    Spouse name: Not on file   Number of children: Not on file   Years of education: Not on file   Highest  education level: Not on file  Occupational History   Not on file  Tobacco Use   Smoking status: Never   Smokeless tobacco: Never  Substance and Sexual Activity   Alcohol use: No   Drug use: No   Sexual activity: Not on file  Other Topics Concern   Not on file  Social History Narrative   Works in [employer]   Social Drivers of Health   Financial Resource Strain: Not on file  Food Insecurity: No Food Insecurity (12/27/2023)   Hunger Vital Sign    Worried About Running Out of Food in the Last Year: Never true    Ran Out of Food in the Last Year: Never true  Transportation Needs: No Transportation Needs (12/27/2023)   PRAPARE - Administrator, Civil Service (Medical): No    Lack of Transportation (Non-Medical): No  Physical Activity: Not on file  Stress: Not on file  Social Connections: Not on file     Vital Signs: There were no vitals taken for this visit.  Physical Exam Constitutional:      General: He is not in acute distress. HENT:     Head: Normocephalic.     Mouth/Throat:     Mouth: Mucous membranes are moist.  Eyes:     General: No scleral icterus. Cardiovascular:     Rate and Rhythm: Normal rate and regular rhythm.  Pulmonary:     Effort: No respiratory distress.  Abdominal:     General: There is no distension.     Comments: RLQ drain in place, scant serosanguinous fluid in bag.  Skin:    General: Skin is warm and dry.  Neurological:     Mental Status: He is alert and oriented to person, place, and time.     Imaging: Drain injection 01/21/24   Labs:  CBC: Recent Labs    11/18/23 0417 11/19/23 0458 12/27/23 0647 12/28/23 0350  WBC 12.1* 9.6 12.6* 9.3  HGB 13.6 12.8* 14.4 12.6*  HCT 39.5 37.7* 44.1 38.6*  PLT 283 282 312 235    COAGS: Recent Labs    09/29/23 1037 11/15/23 1709 12/28/23 0840  INR 1.1 1.1 1.0  APTT  --  31  --     BMP: Recent Labs    11/18/23 0417 11/19/23 0458 12/27/23 0647 12/28/23 0350  12/29/23 0417  NA 133* 135 140 136  --   K 3.7 4.0 4.1 4.0  --   CL 100 103 105 101  --   CO2 21* 21* 22 23  --   GLUCOSE 111* 101* 113* 95  --   BUN 13 17 17 14   --   CALCIUM 8.8* 8.8* 9.9 9.2  --   CREATININE 1.03 0.95 1.23 1.00 0.98  GFRNONAA >60 >60 >60 >60 >60    LIVER FUNCTION TESTS: Recent Labs    09/24/23 1122 11/15/23 1554 11/16/23 0437 12/27/23 0647  BILITOT 1.9* 1.4* 1.4* 0.8  AST 22  15 17 21   ALT 38 26 22 26   ALKPHOS 44 50 48 57  PROT 7.6 7.6 6.5 7.7  ALBUMIN 4.3 4.7 3.5 4.2    Assessment and Plan: 58 year old male with a history recurrent right lower quadrant anterior abdominal gas containing abscess status post percutaneous drain replacement on 12/28/23.  The collection and previously visualized peritoneal fistula have resolved.  The drain was removed.  Follow up with IR as needed.  Marliss Coots, MD Pager: [phone]    I spent a total of 25 Minutes in face to face in clinical consultation, greater than 50% of which was counseling/coordinating care for diverticular abscess.

## 2024-01-21 ENCOUNTER — Ambulatory Visit
Admission: RE | Admit: 2024-01-21 | Discharge: 2024-01-21 | Disposition: A | Discharge status: Home / Self Care | Source: Ambulatory Visit | Attending: General Surgery | Admitting: General Surgery

## 2024-01-21 DIAGNOSIS — K651 Peritoneal abscess: Secondary | ICD-10-CM

## 2024-01-21 HISTORY — PX: IR RADIOLOGIST EVAL & MGMT: IMG5224

## 2024-01-21 MED ORDER — IOHEXOL 300 MG/ML  SOLN
10.0000 mL | Freq: Once | INTRAMUSCULAR | Status: AC | PRN
  Administered 2024-01-21: 10 mL

## 2024-01-27 NOTE — Progress Notes (Signed)
 Surgery orders requested via Epic inbox.

## 2024-01-28 ENCOUNTER — Ambulatory Visit: Payer: Self-pay | Admitting: Surgery

## 2024-01-28 DIAGNOSIS — Z01818 Encounter for other preprocedural examination: Secondary | ICD-10-CM

## 2024-01-31 ENCOUNTER — Other Ambulatory Visit: Payer: Self-pay | Admitting: Urology

## 2024-01-31 NOTE — Progress Notes (Signed)
 Surgery orders requested with Bjorn Loser at Dr. Estil Daft office.

## 2024-02-01 NOTE — Patient Instructions (Signed)
 SURGICAL WAITING ROOM VISITATION Patients having surgery or a procedure may have no more than 2 support people in the waiting area - these visitors may rotate in the visitor waiting room.   If the patient needs to stay at the hospital during part of their recovery, the visitor guidelines for inpatient rooms apply.  PRE-OP VISITATION  Pre-op nurse will coordinate an appropriate time for 1 support person to accompany the patient in pre-op.  This support person may not rotate.  This visitor will be contacted when the time is appropriate for the visitor to come back in the pre-op area.  Please refer to the [facility] website for the visitor guidelines for Inpatients (after your surgery is over and you are in a regular room).  You are not required to quarantine at this time prior to your surgery. However, you must do this: Hand Hygiene often Do NOT share personal items Notify your provider if you are in close contact with someone who has COVID or you develop fever 100.4 or greater, new onset of sneezing, cough, sore throat, shortness of breath or body aches.  If you test positive for Covid or have been in contact with anyone that has tested positive in the last 10 days please notify you surgeon.    Your procedure is scheduled on:  Friday  February 11, 2024  Report to [facility] Main Entrance: Leota Jacobsen entrance where the [employer] is available.   Report to admitting at: 07:15    AM  Call this number if you have any questions or problems the morning of surgery [phone]  FOLLOW ANY ADDITIONAL PRE OP INSTRUCTIONS YOU RECEIVED FROM YOUR SURGEON'S OFFICE!!!  Dulcolax 20 mg (total) - Take 4 (four) of the 5 mg Dulcolax tablets with water at 07:00 am the day prior to surgery.  Miralax 255 g - Mix with 64 oz Gatorade/Powerade.  Starting at 10:00 am ,Drink this gradually over the next few hours (8 oz glass every 15-30 minutes) until gone the day prior to surgery You should finish in 4  hours-6 hours.    Neomycin 1000 mg - At 2 pm, 3 pm and 10 pm after Miralax  bowel prep the day prior to surgery.  Metronidazole 1000 mg - At 2 pm, 3 pm and 10 pm after Miralax bowel prep the day prior to surgery.   Drink plenty of clear liquids all evening to avoid getting dehydrated.   DRINK two (2) bottles of Pre-Surgery Clear Ensure drink starting at 6:00 pm the evening prior to your surgery to help prevent dehydration. Increase drinking clear fluids (see list below)          Do not eat food after Midnight the night prior to your surgery/procedure.  After Midnight you may have the following liquids until   06:30 AM DAY OF SURGERY  Clear Liquid Diet Water Black Coffee (sugar ok, NO MILK/CREAM OR CREAMERS)  Tea (sugar ok, NO MILK/CREAM OR CREAMERS) regular and decaf                             Plain Jell-O  with no fruit (NO RED)                                           Fruit ices (not with fruit pulp, NO RED)  Popsicles (NO RED)                                                                  Juice: NO CITRUS JUICES: only apple, WHITE grape, WHITE cranberry Sports drinks like Gatorade or Powerade (NO RED)                   The day of surgery:  Drink ONE (1) Pre-Surgery Clear Ensure at  06:30 AM the morning of surgery. Drink in one sitting. Do not sip.  This drink was given to you during your hospital pre-op appointment visit. Nothing else to drink after completing the Pre-Surgery Clear Ensure or G2 : No candy, chewing gum or throat lozenges.     Oral Hygiene is also important to reduce your risk of infection.        Remember - BRUSH YOUR TEETH THE MORNING OF SURGERY WITH YOUR REGULAR TOOTHPASTE  Do NOT smoke after Midnight the night before surgery.  STOP TAKING all Vitamins, Herbs and supplements 1 week before your surgery.   Take ONLY these medicines the morning of surgery with A SIP OF WATER: Augmentin, Tylenol if needed for pain.   Metoprolol ? taking                    You may not have any metal on your body including  jewelry, and body piercing  Do not wear lotions, powders, cologne, or deodorant  Men may shave face and neck.  Contacts, Hearing Aids, dentures or bridgework may not be worn into surgery. DENTURES WILL BE REMOVED PRIOR TO SURGERY PLEASE DO NOT APPLY "Poly grip" OR ADHESIVES!!!  You may bring a small overnight bag with you on the day of surgery, only pack items that are not valuable. [place] IS NOT RESPONSIBLE   FOR VALUABLES THAT ARE LOST OR STOLEN.   Do not bring your home medications to the hospital. The Pharmacy will dispense medications listed on your medication list to you during your admission in the Hospital.  Please read over the following fact sheets you were given: IF YOU HAVE QUESTIONS ABOUT YOUR PRE-OP INSTRUCTIONS, PLEASE CALL [phone]   [facility] - Preparing for Surgery Before surgery, you can play an important role.  Because skin is not sterile, your skin needs to be as free of germs as possible.  You can reduce the number of germs on your skin by washing with CHG (chlorahexidine gluconate) soap before surgery.  CHG is an antiseptic cleaner which kills germs and bonds with the skin to continue killing germs even after washing. Please DO NOT use if you have an allergy to CHG or antibacterial soaps.  If your skin becomes reddened/irritated stop using the CHG and inform your nurse when you arrive at Short Stay. Do not shave (including legs and underarms) for at least 48 hours prior to the first CHG shower.  You may shave your face/neck.  Please follow these instructions carefully:  1.  Shower with CHG Soap the night before surgery and the  morning of surgery.  2.  If you choose to wash your hair, wash your hair first as usual with your normal  shampoo.  3.  After you shampoo, rinse your hair and body thoroughly  to remove the shampoo.                             4.  Use CHG as you  would any other liquid soap.  You can apply chg directly to the skin and wash.  Gently with a scrungie or clean washcloth.  5.  Apply the CHG Soap to your body ONLY FROM THE NECK DOWN.   Do not use on face/ open                           Wound or open sores. Avoid contact with eyes, ears mouth and genitals (private parts).                       Wash face,  Genitals (private parts) with your normal soap.             6.  Wash thoroughly, paying special attention to the area where your  surgery  will be performed.  7.  Thoroughly rinse your body with warm water from the neck down.  8.  DO NOT shower/wash with your normal soap after using and rinsing off the CHG Soap.            9.  Pat yourself dry with a clean towel.            10.  Wear clean pajamas.            11.  Place clean sheets on your bed the night of your first shower and do not  sleep with pets.  ON THE DAY OF SURGERY : Do not apply any lotions/deodorants the morning of surgery.  Please wear clean clothes to the hospital/surgery center.     FAILURE TO FOLLOW THESE INSTRUCTIONS MAY RESULT IN THE CANCELLATION OF YOUR SURGERY  PATIENT SIGNATURE_________________________________  NURSE SIGNATURE__________________________________  ________________________________________________________________________         Henry Paul    An incentive spirometer is a tool that can help keep your lungs clear and active. This tool measures how well you are filling your lungs with each breath. Taking long deep breaths may help reverse or decrease the chance of developing breathing (pulmonary) problems (especially infection) following: A long period of time when you are unable to move or be active. BEFORE THE PROCEDURE  If the spirometer includes an indicator to show your best effort, your nurse or respiratory therapist will set it to a desired goal. If possible, sit up straight or lean slightly forward. Try not to slouch. Hold the  incentive spirometer in an upright position. INSTRUCTIONS FOR USE  Sit on the edge of your bed if possible, or sit up as far as you can in bed or on a chair. Hold the incentive spirometer in an upright position. Breathe out normally. Place the mouthpiece in your mouth and seal your lips tightly around it. Breathe in slowly and as deeply as possible, raising the piston or the ball toward the top of the column. Hold your breath for 3-5 seconds or for as long as possible. Allow the piston or ball to fall to the bottom of the column. Remove the mouthpiece from your mouth and breathe out normally. Rest for a few seconds and repeat Steps 1 through 7 at least 10 times every 1-2 hours when you are awake. Take your time and take a few normal breaths between deep  breaths. The spirometer may include an indicator to show your best effort. Use the indicator as a goal to work toward during each repetition. After each set of 10 deep breaths, practice coughing to be sure your lungs are clear. If you have an incision (the cut made at the time of surgery), support your incision when coughing by placing a pillow or rolled up towels firmly against it. Once you are able to get out of bed, walk around indoors and cough well. You may stop using the incentive spirometer when instructed by your caregiver.  RISKS AND COMPLICATIONS Take your time so you do not get dizzy or light-headed. If you are in pain, you may need to take or ask for pain medication before doing incentive spirometry. It is harder to take a deep breath if you are having pain. AFTER USE Rest and breathe slowly and easily. It can be helpful to keep track of a log of your progress. Your caregiver can provide you with a simple table to help with this. If you are using the spirometer at home, follow these instructions: SEEK MEDICAL CARE IF:  You are having difficultly using the spirometer. You have trouble using the spirometer as often as instructed. Your  pain medication is not giving enough relief while using the spirometer. You develop fever of 100.5 F (38.1 C) or higher.                                                                                                    SEEK IMMEDIATE MEDICAL CARE IF:  You cough up bloody sputum that had not been present before. You develop fever of 102 F (38.9 C) or greater. You develop worsening pain at or near the incision site. MAKE SURE YOU:  Understand these instructions. Will watch your condition. Will get help right away if you are not doing well or get worse. Document Released: 03/01/2007 Document Revised: 01/11/2012 Document Reviewed: 05/02/2007 [facility] Patient Information 2014 [place], [place].

## 2024-02-01 NOTE — Progress Notes (Signed)
 COVID Vaccine received:  []  No [x]  Yes Date of any COVID positive Test in last 90 days:  None  PCP - [name], MD at [place] [phone] (Work) [phone] (Fax) Cardiologist -   Chest x-ray - 08-20-2022  1v  Epic EKG -08-24-2022  Epic Stress Test -  ECHO -  Cardiac Cath -   PCR screen: []  Ordered & Completed [x]   No Order but Needs PROFEND     []   N/A for this surgery  Surgery Plan:  []  Ambulatory   []  Outpatient in bed  [x]  Admit Anesthesia:    [x]  General  []  Spinal  []   Choice []   MAC  Bowel Prep - []  No  [x]   Yes _CCS Prep  Pacemaker / ICD device [x]  No []  Yes   Spinal Cord Stimulator:[x]  No []  Yes       History of Sleep Apnea? [x]  No []  Yes   CPAP used?- [x]  No []  Yes    Does the patient monitor blood sugar?   [x]  N/A   []  No []  Yes  Patient has: [x]  NO Hx DM   []  Pre-DM   []  DM1  []   DM2  Blood Thinner / Instructions:  none Aspirin Instructions:  none  ERAS Protocol Ordered: []  No  [x]  Yes PRE-SURGERY [x]  ENSURE  x 3   []  G2    Patient is to be NPO after: 0630  Dental hx: []  Dentures:  [x]  N/A      []  Bridge or Partial:                   []  Loose or Damaged teeth:   Comments: patient was seen by Ostomy Nurse: Armen Pickup for consult and markings. Patient agreed to this consult.   Activity level: Patient is able to climb a flight of stairs without difficulty; [x]  No CP  [x]  No SOB, but would have ___   Patient can / can not perform ADLs without assistance.   Anesthesia review: s/p perforated sigmoid (diverticulitis), fatty liver,  HTN, GERD  Patient denies shortness of breath, fever, cough and chest pain at PAT appointment.  Patient verbalized understanding and agreement to the Pre-Surgical Instructions that were given to them at this PAT appointment. Patient was also educated of the need to review these PAT instructions again prior to his surgery.I reviewed the appropriate phone numbers to call if they have any and questions or concerns.

## 2024-02-03 ENCOUNTER — Encounter (HOSPITAL_COMMUNITY)
Admission: RE | Admit: 2024-02-03 | Discharge: 2024-02-03 | Disposition: A | Discharge status: Home / Self Care | Source: Ambulatory Visit | Attending: Surgery | Admitting: Surgery

## 2024-02-03 ENCOUNTER — Other Ambulatory Visit: Payer: Self-pay

## 2024-02-03 ENCOUNTER — Encounter (HOSPITAL_COMMUNITY): Payer: Self-pay

## 2024-02-03 VITALS — BP 113/85 | HR 90 | Temp 98.0°F | Resp 16 | Ht 69.0 in | Wt 171.0 lb

## 2024-02-03 DIAGNOSIS — Z01818 Encounter for other preprocedural examination: Secondary | ICD-10-CM | POA: Insufficient documentation

## 2024-02-03 DIAGNOSIS — I10 Essential (primary) hypertension: Secondary | ICD-10-CM | POA: Insufficient documentation

## 2024-02-03 HISTORY — DX: Pneumonia, unspecified organism: J18.9

## 2024-02-03 HISTORY — DX: Essential (primary) hypertension: I10

## 2024-02-03 LAB — COMPREHENSIVE METABOLIC PANEL WITH GFR
ALT: 28 U/L (ref 0–44)
AST: 24 U/L (ref 15–41)
Albumin: 4.1 g/dL (ref 3.5–5.0)
Alkaline Phosphatase: 72 U/L (ref 38–126)
Anion gap: 12 (ref 5–15)
BUN: 17 mg/dL (ref 6–20)
CO2: 23 mmol/L (ref 22–32)
Calcium: 9.6 mg/dL (ref 8.9–10.3)
Chloride: 101 mmol/L (ref 98–111)
Creatinine, Ser: 0.96 mg/dL (ref 0.61–1.24)
GFR, Estimated: >60 mL/min (ref >60)
Glucose, Bld: 97 mg/dL (ref 70–99)
Potassium: 4.2 mmol/L (ref 3.5–5.1)
Sodium: 136 mmol/L (ref 135–145)
Total Bilirubin: 0.4 mg/dL (ref 0.0–1.2)
Total Protein: 8 g/dL (ref 6.5–8.1)

## 2024-02-03 LAB — CBC WITH DIFFERENTIAL/PLATELET
Abs Immature Granulocytes: 0.13 10*3/uL — ABNORMAL HIGH (ref 0.00–0.07)
Basophils Absolute: 0.1 10*3/uL (ref 0.0–0.1)
Basophils Relative: 1 %
Eosinophils Absolute: 0.1 10*3/uL (ref 0.0–0.5)
Eosinophils Relative: 1 %
HCT: 42.8 % (ref 39.0–52.0)
Hemoglobin: 13.6 g/dL (ref 13.0–17.0)
Immature Granulocytes: 1 %
Lymphocytes Relative: 20 %
Lymphs Abs: 2.3 10*3/uL (ref 0.7–4.0)
MCH: 28.8 pg (ref 26.0–34.0)
MCHC: 31.8 g/dL (ref 30.0–36.0)
MCV: 90.5 fL (ref 80.0–100.0)
Monocytes Absolute: 0.7 10*3/uL (ref 0.1–1.0)
Monocytes Relative: 6 %
Neutro Abs: 8 10*3/uL — ABNORMAL HIGH (ref 1.7–7.7)
Neutrophils Relative %: 71 %
Platelets: 377 10*3/uL (ref 150–400)
RBC: 4.73 MIL/uL (ref 4.22–5.81)
RDW: 13.8 % (ref 11.5–15.5)
WBC: 11.4 10*3/uL — ABNORMAL HIGH (ref 4.0–10.5)
nRBC: 0 % (ref 0.0–0.2)

## 2024-02-03 NOTE — Consult Note (Signed)
 WOC Nurse requested for preoperative stoma site marking  Discussed surgical procedure and stoma creation with patient and family.  Explained role of the WOC nurse team.  Provided the patient with educational booklet and provided samples of pouching options.  Answered patient and family questions.   Examined patient sitting and standing in order to place the marking in the patient's visual field, away from any creases or abdominal contour issues and within the rectus muscle.   Marked for colostomy in the LLQ  __5__ cm to the left of the umbilicus and __0.5__cm below the umbilicus.   Patient's abdomen cleansed with CHG wipes at site markings, allowed to air dry prior to marking.Covered mark with thin film transparent dressing to preserve mark until date of surgery.   WOC Nurse team will follow up with patient after surgery for continue ostomy care and teaching.  Raizy Auzenne [facility] MSN, RN,CWOCN, CNS, [employer] [phone]

## 2024-02-10 NOTE — Anesthesia Preprocedure Evaluation (Signed)
 Anesthesia Evaluation  Patient identified by MRN, date of birth, ID band Patient awake    Reviewed: Allergy & Precautions, NPO status , Patient's Chart, lab work & pertinent test results  Airway Mallampati: II  TM Distance: >3 FB Neck ROM: Full    Dental no notable dental hx. (+) Dental Advisory Given   Pulmonary neg pulmonary ROS   Pulmonary exam normal breath sounds clear to auscultation       Cardiovascular hypertension, Pt. on medications and Pt. on home beta blockers Normal cardiovascular exam Rhythm:Regular Rate:Normal     Neuro/Psych negative neurological ROS     GI/Hepatic Neg liver ROS,GERD  Medicated and Controlled,,  Endo/Other  negative endocrine ROS    Renal/GU negative Renal ROS     Musculoskeletal negative musculoskeletal ROS (+)    Abdominal  (+) - obese  Peds  Hematology negative hematology ROS (+)   Anesthesia Other Findings   Reproductive/Obstetrics                              Anesthesia Physical Anesthesia Plan  ASA: 2  Anesthesia Plan: General   Post-op Pain Management: Tylenol PO (pre-op)*, Gabapentin PO (pre-op)*, Ketamine IV* and Lidocaine infusion*   Induction: Intravenous  PONV Risk Score and Plan: 4 or greater and Ondansetron, Dexamethasone, Treatment may vary due to age or medical condition and Midazolam  Airway Management Planned: Oral ETT  Additional Equipment:   Intra-op Plan:   Post-operative Plan: Extubation in OR  Informed Consent: I have reviewed the patients History and Physical, chart, labs and discussed the procedure including the risks, benefits and alternatives for the proposed anesthesia with the patient or authorized representative who has indicated his/her understanding and acceptance.     Dental advisory given  Plan Discussed with: CRNA  Anesthesia Plan Comments: (2 x PIV)        Anesthesia Quick Evaluation

## 2024-02-11 ENCOUNTER — Inpatient Hospital Stay (HOSPITAL_COMMUNITY): Admitting: Anesthesiology

## 2024-02-11 ENCOUNTER — Encounter (HOSPITAL_COMMUNITY)
Admission: RE | Disposition: A | Discharge status: Home / Self Care | Payer: Self-pay | Source: Home / Self Care | Attending: Surgery

## 2024-02-11 ENCOUNTER — Other Ambulatory Visit: Payer: Self-pay

## 2024-02-11 ENCOUNTER — Encounter (HOSPITAL_COMMUNITY): Payer: Self-pay | Admitting: Surgery

## 2024-02-11 ENCOUNTER — Inpatient Hospital Stay (HOSPITAL_COMMUNITY)
Admission: RE | Admit: 2024-02-11 | Discharge: 2024-02-13 | DRG: 329 | Disposition: A | Discharge status: Home / Self Care | Attending: Surgery | Admitting: Surgery

## 2024-02-11 DIAGNOSIS — K5700 Diverticulitis of small intestine with perforation and abscess without bleeding: Principal | ICD-10-CM | POA: Diagnosis present

## 2024-02-11 DIAGNOSIS — K658 Other peritonitis: Secondary | ICD-10-CM | POA: Diagnosis present

## 2024-02-11 DIAGNOSIS — Z8701 Personal history of pneumonia (recurrent): Secondary | ICD-10-CM

## 2024-02-11 DIAGNOSIS — N401 Enlarged prostate with lower urinary tract symptoms: Secondary | ICD-10-CM | POA: Diagnosis present

## 2024-02-11 DIAGNOSIS — K760 Fatty (change of) liver, not elsewhere classified: Secondary | ICD-10-CM | POA: Diagnosis present

## 2024-02-11 DIAGNOSIS — Z9049 Acquired absence of other specified parts of digestive tract: Secondary | ICD-10-CM

## 2024-02-11 DIAGNOSIS — Z9889 Other specified postprocedural states: Principal | ICD-10-CM

## 2024-02-11 DIAGNOSIS — Z79899 Other long term (current) drug therapy: Secondary | ICD-10-CM

## 2024-02-11 DIAGNOSIS — K5720 Diverticulitis of large intestine with perforation and abscess without bleeding: Secondary | ICD-10-CM | POA: Diagnosis present

## 2024-02-11 DIAGNOSIS — I10 Essential (primary) hypertension: Secondary | ICD-10-CM | POA: Diagnosis present

## 2024-02-11 DIAGNOSIS — K5732 Diverticulitis of large intestine without perforation or abscess without bleeding: Secondary | ICD-10-CM | POA: Diagnosis present

## 2024-02-11 DIAGNOSIS — K219 Gastro-esophageal reflux disease without esophagitis: Secondary | ICD-10-CM | POA: Diagnosis present

## 2024-02-11 HISTORY — PX: SMALL BOWEL REPAIR: SHX6447

## 2024-02-11 HISTORY — PX: FLEXIBLE SIGMOIDOSCOPY: SHX5431

## 2024-02-11 HISTORY — PX: CYSTOSCOPY: SHX5120

## 2024-02-11 HISTORY — PX: LAPAROSCOPIC LYSIS OF ADHESIONS: SHX5905

## 2024-02-11 HISTORY — PX: XI ROBOTIC ASSISTED LOWER ANTERIOR RESECTION: SHX6558

## 2024-02-11 LAB — TYPE AND SCREEN
ABO/RH(D): O POS
Antibody Screen: NEGATIVE

## 2024-02-11 SURGERY — RESECTION, RECTUM, LOW ANTERIOR, ROBOT-ASSISTED
Anesthesia: General

## 2024-02-11 MED ORDER — ACETAMINOPHEN 500 MG PO TABS
1000.0000 mg | ORAL_TABLET | Freq: Four times a day (QID) | ORAL | Status: DC
  Administered 2024-02-11 – 2024-02-13 (×7): 1000 mg via ORAL
  Filled 2024-02-11 (×7): qty 2

## 2024-02-11 MED ORDER — LORATADINE 10 MG PO TABS
10.0000 mg | ORAL_TABLET | Freq: Every day | ORAL | Status: DC
  Administered 2024-02-11 – 2024-02-13 (×3): 10 mg via ORAL
  Filled 2024-02-11 (×3): qty 1

## 2024-02-11 MED ORDER — ONDANSETRON HCL 4 MG/2ML IJ SOLN
INTRAMUSCULAR | Status: AC
  Filled 2024-02-11: qty 2

## 2024-02-11 MED ORDER — BUPIVACAINE LIPOSOME 1.3 % IJ SUSP
INTRAMUSCULAR | Status: DC | PRN
  Administered 2024-02-11: 20 mL

## 2024-02-11 MED ORDER — LIDOCAINE HCL (CARDIAC) PF 100 MG/5ML IV SOSY
PREFILLED_SYRINGE | INTRAVENOUS | Status: DC | PRN
  Administered 2024-02-11: 60 mg via INTRAVENOUS

## 2024-02-11 MED ORDER — PHENYLEPHRINE 80 MCG/ML (10ML) SYRINGE FOR IV PUSH (FOR BLOOD PRESSURE SUPPORT)
PREFILLED_SYRINGE | INTRAVENOUS | Status: AC
  Filled 2024-02-11: qty 10

## 2024-02-11 MED ORDER — TRAMADOL HCL 50 MG PO TABS: 50.0000 mg | ORAL_TABLET | Freq: Four times a day (QID) | ORAL | 0 refills | Status: AC | PRN

## 2024-02-11 MED ORDER — LIDOCAINE 2% (20 MG/ML) 5 ML SYRINGE
INTRAMUSCULAR | Status: DC | PRN
  Administered 2024-02-11: 1.5 mg/kg/h via INTRAVENOUS

## 2024-02-11 MED ORDER — ALUM & MAG HYDROXIDE-SIMETH 200-200-20 MG/5ML PO SUSP: 30.0000 mL | Freq: Four times a day (QID) | ORAL | Status: DC | PRN

## 2024-02-11 MED ORDER — SIMETHICONE 80 MG PO CHEW: 40.0000 mg | CHEWABLE_TABLET | Freq: Four times a day (QID) | ORAL | Status: DC | PRN

## 2024-02-11 MED ORDER — ENSURE SURGERY PO LIQD
237.0000 mL | Freq: Two times a day (BID) | ORAL | Status: DC
  Administered 2024-02-12 (×2): 237 mL via ORAL

## 2024-02-11 MED ORDER — BUPIVACAINE LIPOSOME 1.3 % IJ SUSP: 20.0000 mL | Freq: Once | INTRAMUSCULAR | Status: DC

## 2024-02-11 MED ORDER — BUPIVACAINE-EPINEPHRINE (PF) 0.25% -1:200000 IJ SOLN
INTRAMUSCULAR | Status: DC | PRN
Start: 2024-02-11 — End: 2024-02-11
  Administered 2024-02-11: 30 mL

## 2024-02-11 MED ORDER — SUGAMMADEX SODIUM 200 MG/2ML IV SOLN
INTRAVENOUS | Status: DC | PRN
  Administered 2024-02-11: 200 mg via INTRAVENOUS

## 2024-02-11 MED ORDER — FENTANYL CITRATE (PF) 100 MCG/2ML IJ SOLN
INTRAMUSCULAR | Status: AC
  Filled 2024-02-11: qty 2

## 2024-02-11 MED ORDER — ENSURE PRE-SURGERY PO LIQD: 296.0000 mL | Freq: Once | ORAL | Status: DC

## 2024-02-11 MED ORDER — TRAMADOL HCL 50 MG PO TABS
50.0000 mg | ORAL_TABLET | Freq: Four times a day (QID) | ORAL | Status: DC | PRN
  Administered 2024-02-11 – 2024-02-12 (×3): 50 mg via ORAL
  Filled 2024-02-11 (×3): qty 1

## 2024-02-11 MED ORDER — TAMSULOSIN HCL 0.4 MG PO CAPS
0.4000 mg | ORAL_CAPSULE | Freq: Every day | ORAL | Status: DC
  Administered 2024-02-11 – 2024-02-12 (×2): 0.4 mg via ORAL
  Filled 2024-02-11 (×2): qty 1

## 2024-02-11 MED ORDER — ALVIMOPAN 12 MG PO CAPS
12.0000 mg | ORAL_CAPSULE | ORAL | Status: AC
  Administered 2024-02-11: 12 mg via ORAL
  Filled 2024-02-11: qty 1

## 2024-02-11 MED ORDER — INDOCYANINE GREEN 25 MG IV SOLR
INTRAVENOUS | Status: DC | PRN
  Administered 2024-02-11: 2.5 mg via INTRAVENOUS

## 2024-02-11 MED ORDER — ONDANSETRON HCL 4 MG/2ML IJ SOLN
4.0000 mg | Freq: Four times a day (QID) | INTRAMUSCULAR | Status: DC | PRN
  Administered 2024-02-11: 4 mg via INTRAVENOUS
  Filled 2024-02-11: qty 2

## 2024-02-11 MED ORDER — IBUPROFEN 400 MG PO TABS
600.0000 mg | ORAL_TABLET | Freq: Four times a day (QID) | ORAL | Status: DC | PRN
  Administered 2024-02-11 – 2024-02-12 (×2): 600 mg via ORAL
  Filled 2024-02-11 (×2): qty 1

## 2024-02-11 MED ORDER — FENTANYL CITRATE (PF) 100 MCG/2ML IJ SOLN
INTRAMUSCULAR | Status: DC | PRN
  Administered 2024-02-11: 100 ug via INTRAVENOUS

## 2024-02-11 MED ORDER — ENSURE PRE-SURGERY PO LIQD: 592.0000 mL | Freq: Once | ORAL | Status: DC

## 2024-02-11 MED ORDER — ALVIMOPAN 12 MG PO CAPS
12.0000 mg | ORAL_CAPSULE | Freq: Two times a day (BID) | ORAL | Status: DC
  Administered 2024-02-12: 12 mg via ORAL
  Filled 2024-02-11 (×2): qty 1

## 2024-02-11 MED ORDER — DEXAMETHASONE SODIUM PHOSPHATE 10 MG/ML IJ SOLN
INTRAMUSCULAR | Status: DC | PRN
  Administered 2024-02-11: 10 mg via INTRAVENOUS

## 2024-02-11 MED ORDER — ROCURONIUM BROMIDE 10 MG/ML (PF) SYRINGE
PREFILLED_SYRINGE | INTRAVENOUS | Status: AC
  Filled 2024-02-11: qty 10

## 2024-02-11 MED ORDER — HYDROMORPHONE HCL 1 MG/ML IJ SOLN
INTRAMUSCULAR | Status: AC
  Filled 2024-02-11: qty 1

## 2024-02-11 MED ORDER — SODIUM CHLORIDE 0.9 % IV SOLN
2.0000 g | INTRAVENOUS | Status: AC
  Administered 2024-02-11: 2 g via INTRAVENOUS
  Filled 2024-02-11: qty 2

## 2024-02-11 MED ORDER — ONDANSETRON HCL 4 MG PO TABS
4.0000 mg | ORAL_TABLET | Freq: Four times a day (QID) | ORAL | Status: DC | PRN
Start: 2024-02-11 — End: 2024-02-13

## 2024-02-11 MED ORDER — STERILE WATER FOR INJECTION IJ SOLN
INTRAMUSCULAR | Status: AC
  Filled 2024-02-11: qty 10

## 2024-02-11 MED ORDER — PANTOPRAZOLE SODIUM 20 MG PO TBEC
20.0000 mg | DELAYED_RELEASE_TABLET | Freq: Every day | ORAL | Status: DC
  Administered 2024-02-11 – 2024-02-12 (×2): 20 mg via ORAL
  Filled 2024-02-11 (×2): qty 1

## 2024-02-11 MED ORDER — DIPHENHYDRAMINE HCL 12.5 MG/5ML PO ELIX
12.5000 mg | ORAL_SOLUTION | Freq: Four times a day (QID) | ORAL | Status: DC | PRN
  Administered 2024-02-11 – 2024-02-12 (×2): 12.5 mg via ORAL
  Filled 2024-02-11 (×2): qty 5

## 2024-02-11 MED ORDER — EPHEDRINE SULFATE-NACL 50-0.9 MG/10ML-% IV SOSY
PREFILLED_SYRINGE | INTRAVENOUS | Status: DC | PRN
  Administered 2024-02-11: 5 mg via INTRAVENOUS
  Administered 2024-02-11: 10 mg via INTRAVENOUS
  Administered 2024-02-11: 5 mg via INTRAVENOUS

## 2024-02-11 MED ORDER — HYDROMORPHONE HCL 1 MG/ML IJ SOLN
0.5000 mg | INTRAMUSCULAR | Status: DC | PRN
  Administered 2024-02-12: 0.5 mg via INTRAVENOUS
  Filled 2024-02-11 (×2): qty 0.5

## 2024-02-11 MED ORDER — HEPARIN SODIUM (PORCINE) 5000 UNIT/ML IJ SOLN
5000.0000 [IU] | Freq: Three times a day (TID) | INTRAMUSCULAR | Status: DC
  Administered 2024-02-11 – 2024-02-13 (×5): 5000 [IU] via SUBCUTANEOUS
  Filled 2024-02-11 (×5): qty 1

## 2024-02-11 MED ORDER — ACETAMINOPHEN 500 MG PO TABS
1000.0000 mg | ORAL_TABLET | ORAL | Status: AC
  Administered 2024-02-11: 1000 mg via ORAL
  Filled 2024-02-11: qty 2

## 2024-02-11 MED ORDER — ONDANSETRON HCL 4 MG/2ML IJ SOLN
INTRAMUSCULAR | Status: AC
Start: 2024-02-11 — End: ?
  Filled 2024-02-11: qty 2

## 2024-02-11 MED ORDER — CHLORHEXIDINE GLUCONATE 0.12 % MT SOLN
15.0000 mL | Freq: Once | OROMUCOSAL | Status: AC
  Administered 2024-02-11: 15 mL via OROMUCOSAL

## 2024-02-11 MED ORDER — SODIUM CHLORIDE (PF) 0.9 % IJ SOLN
INTRAMUSCULAR | Status: DC | PRN
Start: 2024-02-11 — End: 2024-02-11
  Administered 2024-02-11: 20 mL

## 2024-02-11 MED ORDER — PHENYLEPHRINE 80 MCG/ML (10ML) SYRINGE FOR IV PUSH (FOR BLOOD PRESSURE SUPPORT)
PREFILLED_SYRINGE | INTRAVENOUS | Status: DC | PRN
  Administered 2024-02-11 (×9): 80 ug via INTRAVENOUS

## 2024-02-11 MED ORDER — LIDOCAINE HCL 2 % IJ SOLN
INTRAMUSCULAR | Status: AC
  Filled 2024-02-11: qty 20

## 2024-02-11 MED ORDER — LACTATED RINGERS IV SOLN: INTRAVENOUS | Status: DC

## 2024-02-11 MED ORDER — BUPIVACAINE LIPOSOME 1.3 % IJ SUSP
INTRAMUSCULAR | Status: AC
  Filled 2024-02-11: qty 20

## 2024-02-11 MED ORDER — METOPROLOL TARTRATE 25 MG PO TABS
25.0000 mg | ORAL_TABLET | Freq: Two times a day (BID) | ORAL | Status: DC
Start: 2024-02-11 — End: 2024-02-13
  Administered 2024-02-11 – 2024-02-13 (×4): 25 mg via ORAL
  Filled 2024-02-11 (×4): qty 1

## 2024-02-11 MED ORDER — HYDRALAZINE HCL 20 MG/ML IJ SOLN: 10.0000 mg | INTRAMUSCULAR | Status: DC | PRN

## 2024-02-11 MED ORDER — KETAMINE HCL 50 MG/5ML IJ SOSY
PREFILLED_SYRINGE | INTRAMUSCULAR | Status: AC
  Filled 2024-02-11: qty 5

## 2024-02-11 MED ORDER — MIDAZOLAM HCL 2 MG/2ML IJ SOLN
INTRAMUSCULAR | Status: AC
  Filled 2024-02-11: qty 2

## 2024-02-11 MED ORDER — GABAPENTIN 300 MG PO CAPS
300.0000 mg | ORAL_CAPSULE | Freq: Once | ORAL | Status: AC
  Administered 2024-02-11: 300 mg via ORAL
  Filled 2024-02-11: qty 1

## 2024-02-11 MED ORDER — HEPARIN SODIUM (PORCINE) 5000 UNIT/ML IJ SOLN
5000.0000 [IU] | Freq: Once | INTRAMUSCULAR | Status: AC
  Administered 2024-02-11: 5000 [IU] via SUBCUTANEOUS
  Filled 2024-02-11: qty 1

## 2024-02-11 MED ORDER — EPHEDRINE 5 MG/ML INJ
INTRAVENOUS | Status: AC
  Filled 2024-02-11: qty 5

## 2024-02-11 MED ORDER — PROPOFOL 10 MG/ML IV BOLUS
INTRAVENOUS | Status: DC | PRN
  Administered 2024-02-11: 160 mg via INTRAVENOUS

## 2024-02-11 MED ORDER — ORAL CARE MOUTH RINSE: 15.0000 mL | Freq: Once | OROMUCOSAL | Status: AC

## 2024-02-11 MED ORDER — ROCURONIUM BROMIDE 10 MG/ML (PF) SYRINGE
PREFILLED_SYRINGE | INTRAVENOUS | Status: DC | PRN
  Administered 2024-02-11: 10 mg via INTRAVENOUS
  Administered 2024-02-11: 80 mg via INTRAVENOUS
  Administered 2024-02-11: 10 mg via INTRAVENOUS

## 2024-02-11 MED ORDER — BUPIVACAINE-EPINEPHRINE (PF) 0.25% -1:200000 IJ SOLN
INTRAMUSCULAR | Status: AC
  Filled 2024-02-11: qty 30

## 2024-02-11 MED ORDER — DEXAMETHASONE SODIUM PHOSPHATE 10 MG/ML IJ SOLN
INTRAMUSCULAR | Status: AC
  Filled 2024-02-11: qty 1

## 2024-02-11 MED ORDER — 0.9 % SODIUM CHLORIDE (POUR BTL) OPTIME
TOPICAL | Status: DC | PRN
  Administered 2024-02-11: 2000 mL

## 2024-02-11 MED ORDER — ONDANSETRON HCL 4 MG/2ML IJ SOLN
INTRAMUSCULAR | Status: DC | PRN
  Administered 2024-02-11 (×2): 4 mg via INTRAVENOUS

## 2024-02-11 MED ORDER — MIDAZOLAM HCL 5 MG/5ML IJ SOLN
INTRAMUSCULAR | Status: DC | PRN
  Administered 2024-02-11: 2 mg via INTRAVENOUS

## 2024-02-11 MED ORDER — LIDOCAINE HCL (PF) 2 % IJ SOLN
INTRAMUSCULAR | Status: AC
Start: 2024-02-11 — End: ?
  Filled 2024-02-11: qty 5

## 2024-02-11 MED ORDER — DIPHENHYDRAMINE HCL 50 MG/ML IJ SOLN: 12.5000 mg | Freq: Four times a day (QID) | INTRAMUSCULAR | Status: DC | PRN

## 2024-02-11 MED ORDER — LACTATED RINGERS IR SOLN
Status: DC | PRN
  Administered 2024-02-11: 2000 mL

## 2024-02-11 MED ORDER — CHLORHEXIDINE GLUCONATE CLOTH 2 % EX PADS: 6.0000 | MEDICATED_PAD | Freq: Once | CUTANEOUS | Status: DC

## 2024-02-11 MED ORDER — KETAMINE HCL 50 MG/5ML IJ SOSY
PREFILLED_SYRINGE | INTRAMUSCULAR | Status: DC | PRN
  Administered 2024-02-11 (×4): 10 mg via INTRAVENOUS

## 2024-02-11 MED ORDER — DROPERIDOL 2.5 MG/ML IJ SOLN: 0.6250 mg | Freq: Once | INTRAMUSCULAR | Status: DC | PRN

## 2024-02-11 MED ORDER — HYDROMORPHONE HCL 1 MG/ML IJ SOLN
0.2500 mg | INTRAMUSCULAR | Status: DC | PRN
  Administered 2024-02-11 (×4): 0.5 mg via INTRAVENOUS

## 2024-02-11 MED ORDER — PROPOFOL 10 MG/ML IV BOLUS
INTRAVENOUS | Status: AC
  Filled 2024-02-11: qty 20

## 2024-02-11 SURGICAL SUPPLY — 117 items
APPLIER CLIP 5 13 M/L LIGAMAX5 (MISCELLANEOUS) IMPLANT
APPLIER CLIP ROT 10 11.4 M/L (STAPLE) IMPLANT
BAG COUNTER SPONGE SURGICOUNT (BAG) IMPLANT
BAG URO CATCHER STRL LF (MISCELLANEOUS) ×2 IMPLANT
BASKET ZERO TIP NITINOL 2.4FR (BASKET) IMPLANT
BLADE EXTENDED COATED 6.5IN (ELECTRODE) ×2 IMPLANT
CANNULA REDUCER 12-8 DVNC XI (CANNULA) ×2 IMPLANT
CATH URETL OPEN END 6FR 70 (CATHETERS) IMPLANT
CELLS DAT CNTRL 66122 CELL SVR (MISCELLANEOUS) IMPLANT
CHLORAPREP W/TINT 26 (MISCELLANEOUS) ×2 IMPLANT
CLIP APPLIE 5 13 M/L LIGAMAX5 (MISCELLANEOUS) IMPLANT
CLIP APPLIE ROT 10 11.4 M/L (STAPLE) IMPLANT
CLIP LIGATING HEM O LOK PURPLE (MISCELLANEOUS) IMPLANT
CLIP LIGATING HEMO O LOK GREEN (MISCELLANEOUS) IMPLANT
CLOTH BEACON ORANGE TIMEOUT ST (SAFETY) ×2 IMPLANT
COVER SURGICAL LIGHT HANDLE (MISCELLANEOUS) ×4 IMPLANT
COVER TIP SHEARS 8 DVNC (MISCELLANEOUS) ×2 IMPLANT
DEFOGGER SCOPE WARMER CLEARIFY (MISCELLANEOUS) ×2 IMPLANT
DERMABOND ADVANCED .7 DNX12 (GAUZE/BANDAGES/DRESSINGS) IMPLANT
DEVICE TROCAR PUNCTURE CLOSURE (ENDOMECHANICALS) IMPLANT
DRAIN CHANNEL 19F RND (DRAIN) ×2 IMPLANT
DRAPE ARM DVNC X/XI (DISPOSABLE) ×8 IMPLANT
DRAPE COLUMN DVNC XI (DISPOSABLE) ×2 IMPLANT
DRAPE SURG IRRIG POUCH 19X23 (DRAPES) ×2 IMPLANT
DRIVER NDL LRG 8 DVNC XI (INSTRUMENTS) ×2 IMPLANT
DRIVER NDLE LRG 8 DVNC XI (INSTRUMENTS) ×2 IMPLANT
DRSG OPSITE POSTOP 4X10 (GAUZE/BANDAGES/DRESSINGS) IMPLANT
DRSG OPSITE POSTOP 4X6 (GAUZE/BANDAGES/DRESSINGS) IMPLANT
DRSG OPSITE POSTOP 4X8 (GAUZE/BANDAGES/DRESSINGS) IMPLANT
DRSG TEGADERM 2-3/8X2-3/4 SM (GAUZE/BANDAGES/DRESSINGS) ×10 IMPLANT
DRSG TEGADERM 4X4.75 (GAUZE/BANDAGES/DRESSINGS) ×2 IMPLANT
ELECT REM PT RETURN 15FT ADLT (MISCELLANEOUS) ×2 IMPLANT
ENDOLOOP SUT PDS II 0 18 (SUTURE) IMPLANT
EVACUATOR SILICONE 100CC (DRAIN) ×2 IMPLANT
GAUZE SPONGE 2X2 8PLY STRL LF (GAUZE/BANDAGES/DRESSINGS) ×2 IMPLANT
GAUZE SPONGE 4X4 12PLY STRL (GAUZE/BANDAGES/DRESSINGS) IMPLANT
GLOVE BIO SURGEON STRL SZ7.5 (GLOVE) ×6 IMPLANT
GLOVE INDICATOR 8.0 STRL GRN (GLOVE) ×6 IMPLANT
GLOVE SURG LX STRL 7.5 STRW (GLOVE) ×2 IMPLANT
GOWN SRG XL LVL 4 BRTHBL STRL (GOWNS) ×2 IMPLANT
GOWN STRL REUS W/ TWL XL LVL3 (GOWN DISPOSABLE) ×12 IMPLANT
GRASPER SUT TROCAR 14GX15 (MISCELLANEOUS) IMPLANT
GRASPER TIP-UP FEN DVNC XI (INSTRUMENTS) ×2 IMPLANT
GUIDEWIRE ANG ZIPWIRE 038X150 (WIRE) IMPLANT
GUIDEWIRE STR DUAL SENSOR (WIRE) ×2 IMPLANT
HOLDER FOLEY CATH W/STRAP (MISCELLANEOUS) ×2 IMPLANT
IRRIG SUCT STRYKERFLOW 2 WTIP (MISCELLANEOUS) ×2 IMPLANT
IRRIGATION SUCT STRKRFLW 2 WTP (MISCELLANEOUS) ×2 IMPLANT
KIT PROCEDURE DVNC SI (MISCELLANEOUS) IMPLANT
KIT TURNOVER KIT A (KITS) IMPLANT
LIGASURE IMPACT 36 18CM CVD LR (INSTRUMENTS) IMPLANT
MANIFOLD NEPTUNE II (INSTRUMENTS) ×2 IMPLANT
NDL INSUFFLATION 14GA 120MM (NEEDLE) ×2 IMPLANT
NEEDLE INSUFFLATION 14GA 120MM (NEEDLE) ×2 IMPLANT
PACK CARDIOVASCULAR III (CUSTOM PROCEDURE TRAY) ×2 IMPLANT
PACK COLON (CUSTOM PROCEDURE TRAY) ×2 IMPLANT
PACK CYSTO (CUSTOM PROCEDURE TRAY) ×2 IMPLANT
PAD POSITIONING PINK XL (MISCELLANEOUS) ×2 IMPLANT
PENCIL SMOKE EVACUATOR (MISCELLANEOUS) IMPLANT
PROTECTOR NERVE ULNAR (MISCELLANEOUS) ×4 IMPLANT
RELOAD PROXIMATE 75MM BLUE (ENDOMECHANICALS) ×6 IMPLANT
RELOAD STAPLE 45 3.5 BLU DVNC (STAPLE) IMPLANT
RELOAD STAPLE 45 4.3 GRN DVNC (STAPLE) IMPLANT
RELOAD STAPLE 60 3.5 BLU DVNC (STAPLE) IMPLANT
RELOAD STAPLE 60 4.3 GRN DVNC (STAPLE) IMPLANT
RELOAD STAPLE 75 3.8 BLU REG (ENDOMECHANICALS) IMPLANT
RELOAD STAPLER 3.5X45 BLU DVNC (STAPLE) IMPLANT
RELOAD STAPLER 3.5X60 BLU DVNC (STAPLE) IMPLANT
RELOAD STAPLER 4.3X45 GRN DVNC (STAPLE) IMPLANT
RELOAD STAPLER 4.3X60 GRN DVNC (STAPLE) ×2 IMPLANT
RETRACTOR WND ALEXIS 18 MED (MISCELLANEOUS) IMPLANT
RTRCTR WOUND ALEXIS 18CM MED (MISCELLANEOUS) IMPLANT
SCISSORS LAP 5X35 DISP (ENDOMECHANICALS) IMPLANT
SCISSORS MNPLR CVD DVNC XI (INSTRUMENTS) ×2 IMPLANT
SEAL UNIV 5-12 XI (MISCELLANEOUS) ×8 IMPLANT
SEALER VESSEL EXT DVNC XI (MISCELLANEOUS) ×2 IMPLANT
SLEEVE ADV FIXATION 5X100MM (TROCAR) IMPLANT
SOL ELECTROSURG ANTI STICK (MISCELLANEOUS) ×2 IMPLANT
SOLUTION ELECTROSURG ANTI STCK (MISCELLANEOUS) ×2 IMPLANT
SPIKE FLUID TRANSFER (MISCELLANEOUS) ×2 IMPLANT
STAPLER 60 SUREFORM DVNC (STAPLE) IMPLANT
STAPLER 90 3.5 STAND SLIM (STAPLE) ×2 IMPLANT
STAPLER 90 3.5 STD SLIM (STAPLE) IMPLANT
STAPLER ECHELON POWER CIR 29 (STAPLE) IMPLANT
STAPLER ECHELON POWER CIR 31 (STAPLE) IMPLANT
STAPLER PROXIMATE 75MM BLUE (STAPLE) IMPLANT
STAPLER RELOAD 3.5X45 BLU DVNC (STAPLE) IMPLANT
STAPLER RELOAD 3.5X60 BLU DVNC (STAPLE) IMPLANT
STAPLER RELOAD 4.3X45 GRN DVNC (STAPLE) IMPLANT
STAPLER RELOAD 4.3X60 GRN DVNC (STAPLE) ×2 IMPLANT
STOPCOCK 4 WAY LG BORE MALE ST (IV SETS) ×4 IMPLANT
SURGILUBE 2OZ TUBE FLIPTOP (MISCELLANEOUS) ×2 IMPLANT
SUT MNCRL AB 4-0 PS2 18 (SUTURE) ×2 IMPLANT
SUT PDS AB 1 CT1 27 (SUTURE) IMPLANT
SUT PDS AB 1 TP1 96 (SUTURE) IMPLANT
SUT PROLENE 0 CT 2 (SUTURE) IMPLANT
SUT PROLENE 2 0 KS (SUTURE) ×2 IMPLANT
SUT PROLENE 2 0 SH DA (SUTURE) IMPLANT
SUT SILK 2 0 SH CR/8 (SUTURE) IMPLANT
SUT SILK 2-0 18XBRD TIE 12 (SUTURE) IMPLANT
SUT SILK 3 0 SH CR/8 (SUTURE) ×2 IMPLANT
SUT SILK 3-0 18XBRD TIE 12 (SUTURE) ×2 IMPLANT
SUT V-LOC BARB 180 2/0GR6 GS22 (SUTURE) IMPLANT
SUT VIC AB 3-0 SH 18 (SUTURE) IMPLANT
SUT VIC AB 3-0 SH 27XBRD (SUTURE) IMPLANT
SUT VICRYL 0 UR6 27IN ABS (SUTURE) ×2 IMPLANT
SUTURE V-LC BRB 180 2/0GR6GS22 (SUTURE) IMPLANT
SYR 10ML LL (SYRINGE) ×2 IMPLANT
SYS LAPSCP GELPORT 120MM (MISCELLANEOUS) IMPLANT
SYS WOUND ALEXIS 18CM MED (MISCELLANEOUS) ×2 IMPLANT
SYSTEM LAPSCP GELPORT 120MM (MISCELLANEOUS) IMPLANT
SYSTEM WOUND ALEXIS 18CM MED (MISCELLANEOUS) ×2 IMPLANT
TAPE UMBILICAL 1/8 X36 TWILL (MISCELLANEOUS) ×2 IMPLANT
TRAY FOLEY MTR SLVR 16FR STAT (SET/KITS/TRAYS/PACK) ×2 IMPLANT
TROCAR ADV FIXATION 5X100MM (TROCAR) ×2 IMPLANT
TUBING CONNECTING 10 (TUBING) ×8 IMPLANT
TUBING INSUFFLATION 10FT LAP (TUBING) ×2 IMPLANT

## 2024-02-11 NOTE — Plan of Care (Signed)
  Problem: Coping: Goal: Level of anxiety will decrease Outcome: Progressing   Problem: Pain Managment: Goal: General experience of comfort will improve and/or be controlled Outcome: Progressing   Problem: Safety: Goal: Ability to remain free from injury will improve Outcome: Progressing   Problem: Skin Integrity: Goal: Risk for impaired skin integrity will decrease Outcome: Progressing

## 2024-02-11 NOTE — Discharge Instructions (Signed)

## 2024-02-11 NOTE — Anesthesia Postprocedure Evaluation (Signed)
 Anesthesia Post Note  Patient: Henry Paul  Procedure(s) Performed: RESECTION, RECTUM, LOW ANTERIOR, ROBOT-ASSISTED 1. Robotic assisted low anterior resection 2. Small bowel resection 3. Enterorrhaphy 4. Colorrhaphy 5. Lysis of adhesions x 90 minutes 6. Intraoperative assessment of perfusion using ICG fluorescence imaging 7. Flexible sigmoidoscopy 8. Bilateral transversus abdominus plane (TAP) blocks LYSIS, ADHESIONS, LAPAROSCOPIC REPAIR, SMALL INTESTINE. COLON REPAIR Cystoscopy, retrograde injection of firefly bilateral     Patient location during evaluation: PACU Anesthesia Type: General Level of consciousness: sedated and patient cooperative Pain management: pain level controlled Vital Signs Assessment: post-procedure vital signs reviewed and stable Respiratory status: spontaneous breathing Cardiovascular status: stable Anesthetic complications: no   No notable events documented.  Last Vitals:  Vitals:   02/11/24 1608 02/11/24 1708  BP: 119/84 115/83  Pulse: 92 81  Resp:    Temp:    SpO2: 100% 100%    Last Pain:  Vitals:   02/11/24 1700  TempSrc:   PainSc: 7                  Lewie Loron

## 2024-02-11 NOTE — H&P (Addendum)
 Consultation: BPH, diverticulitis  Requested by: Dr. Marin Olp  History of Present Illness: 58 yo male with a history of diverticulitis and abscess with percutaneous drain.  He presents today for colectomy possible colostomy.  He does have a history of BPH and urinary retention after procedures.  He is on Flomax.  He is had no gross hematuria or dysuria.  No cough cold or congestion.  Urology requested for cystoscopy and firefly injection for ureteral identification.  March 2025 CT scan benign.  Prostate about 50 g.   Past Medical History:  Diagnosis Date   Fatty liver    GERD (gastroesophageal reflux disease)    Hypertension    Pneumonia    Past Surgical History:  Procedure Laterality Date   APPENDECTOMY     CHOLECYSTECTOMY N/A 04/17/2021   Procedure: LAPAROSCOPIC CHOLECYSTECTOMY;  Surgeon: Abigail Miyamoto, MD;  Location: [facility];  Service: General;  Laterality: N/A;   COLONOSCOPY     FINGER SURGERY Left    ring and middle fingers reatttachment after amputation   IR CV LINE INJECTION  01/06/2024   IR RADIOLOGIST EVAL & MGMT  10/13/2023   IR RADIOLOGIST EVAL & MGMT  01/21/2024   WISDOM TOOTH EXTRACTION      Home Medications:  Medications Prior to Admission  Medication Sig Dispense Refill Last Dose/Taking   acetaminophen (TYLENOL) 500 MG tablet Take 1,000 mg by mouth every 6 (six) hours as needed for mild pain (pain score 1-3) or moderate pain (pain score 4-6).   Taking As Needed   amoxicillin-clavulanate (AUGMENTIN) 875-125 MG tablet Take 1 tablet by mouth 2 (two) times daily.   Past Week   cetirizine (ZYRTEC) 10 MG tablet Take 10 mg by mouth at bedtime.   02/10/2024   diphenhydramine-acetaminophen (TYLENOL PM) 25-500 MG TABS tablet Take 2 tablets by mouth at bedtime.   02/10/2024   docusate sodium (COLACE) 100 MG capsule Take 100 mg by mouth daily as needed for moderate constipation.   Past Week   metoprolol tartrate (LOPRESSOR) 25 MG tablet Take 1 tablet (25 mg total) by  mouth 2 (two) times daily. 60 tablet 1 02/11/2024 at  5:00 AM   pantoprazole (PROTONIX) 20 MG tablet Take 20 mg by mouth at bedtime.   02/10/2024   tamsulosin (FLOMAX) 0.4 MG CAPS capsule Take 0.4 mg by mouth at bedtime.   02/10/2024   Sodium Chloride Flush (NORMAL SALINE FLUSH) 0.9 % SOLN 5 mLs by Other route daily. (Patient not taking: Reported on 01/31/2024) 150 mL 2 Not Taking   Allergies: No Known Allergies  History reviewed. No pertinent family history. Social History:  reports that he has never smoked. He has never used smokeless tobacco. He reports that he does not drink alcohol and does not use drugs.  ROS: A complete review of systems was performed.  All systems are negative except for pertinent findings as noted. Review of Systems  All other systems reviewed and are negative.    Physical Exam:  Vital signs in last 24 hours: Temp:  [98.8 F (37.1 C)] 98.8 F (37.1 C) (04/11 0714) Pulse Rate:  [85] 85 (04/11 0714) Resp:  [16] 16 (04/11 0714) BP: (124)/(97) 124/97 (04/11 0714) SpO2:  [97 %] 97 % (04/11 0714) Weight:  [77.6 kg] 77.6 kg (04/11 0714) General:  Alert and oriented, No acute distress HEENT: Normocephalic, atraumatic Cardiovascular: Regular rate and rhythm Lungs: Regular rate and effort Abdomen: Soft, nontender, nondistended, no abdominal masses Back: No CVA tenderness Extremities: No edema Neurologic: Grossly  intact  Laboratory Data:  No results found for this or any previous visit (from the past 24 hours). No results found for this or any previous visit (from the past 240 hours). Creatinine: No results for input(s): "CREATININE" in the last 168 hours.  Impression/Assessment:  BPH, lower urinary tract symptoms, diverticulitis-  Plan:  I discussed with the patient the nature, potential benefits, risks and alternatives to cystoscopy with firefly injection, including side effects of the proposed treatment, the likelihood of the patient achieving the goals of  the procedure, and any potential problems that might occur during the procedure or recuperation.  Discussed he is at slightly increased risk of postop retention given his BPH history.  Continue tamsulosin.  Also discussed other management options such as finasteride or procedures and happy to see him back in outpatient to follow.  All questions answered. Patient elects to proceed.   Jerilee Field 02/11/2024, 8:46 AM

## 2024-02-11 NOTE — Op Note (Signed)
 Preoperative diagnosis: BPH, diverticulosis Postoperative diagnosis: Same  Procedure: Cystoscopy, retrograde injection of firefly bilateral  Surgeon: Mena Goes  Anesthesia: General  Indication for procedure: Henry Paul is a 58 year old male with a history of diverticulitis and abscess.  History of BPH on tamsulosin.  Urology requested for cystoscopy and ureteral identification with firefly retrograde injection.  Findings: On exam the penis was circumcised without mass or lesion.  On cystoscopy the urethra was unremarkable, prostate was actually short and mildly obstructive with lateral lobe hypertrophy and a slightly high bladder neck.  Cystoscopy revealed no mucosal lesions.  No stone or foreign body in the bladder.  Clear E flux bilaterally.  Ureteral orifices were in the normal orthotopic position.  Description of procedure: After consent was obtained patient brought to the operating room.  After adequate anesthesia he was placed lithotomy position and prepped and draped in the usual sterile fashion.  Timeout was performed to confirm the patient and procedure.  The cystoscope was passed per urethra and the bladder carefully inspected.  A 5 French opening catheter was used to inject 7 cc of firefly contrast up the left ureter.  A 5 French open-ended catheter was used to inject 8 cc of firefly contrast up the right ureter.  The scope was backed out and a 16 [place] Foley catheter was placed left gravity drainage.  Patient was turned over to the care of Dr. Cliffton Asters.  Complications: None  Blood loss: None  Specimens: None  Drains: 16 French Foley catheter  Disposition: Patient turned over to the care of Dr. Cliffton Asters for his procedure.

## 2024-02-11 NOTE — H&P (Signed)
 CC: Here today for surgery  HPI: Henry Paul is an 58 y.o. male with history of HTN, GERD, BPH whom is seen in the office today for follow-up for chronic diverticulitis with indwelling percutaneous drain related abscess.   He has previously been seen by one of my partners, Dr. Michaell Cowing, in our practice. He was subsequent however admitted to the hospital where he was seen in consultation by myself 11/16/2023. At that time, was noted that he had had a previous percutaneous drain in place related to a diverticular abscess that had been removed in December. He had undergone CT abdomen/pelvis which showed small amount of free air but no significant free fluid. There was a phlegmon involving possible abscess in his lower abdomen that appeared to abut or communicate with the sigmoid colon. He was started on antibiotics and improved. He was discharged home 3 days later.  CT A/P 01/05/24 1. Significantly decreased size with trace residual extraluminal gas containing fluid collection in the right lower quadrant just anterior to the region of the cecum in just subjacent to the anterior peritoneum measuring up to approximately 2.3 x 1.1 cm in maximum axial dimensions. No indication for additional drain placement. 2. Resolution of right lower quadrant anterior abdominal wall gas collection. 3. Prostatomegaly. 4. Diverticulosis.  IR Visit 01/06/24 IMPRESSION: Despite resolution of gas containing superficial fluid collection, there is persistent fistulous connection to the peritoneum upon drain injection. No definite evidence of enteric fistulization.  PLAN: The drain was switched from bulb suction to bag drainage. The patient was instructed to stop flushing the drain. Follow-up in 2 weeks with interventional radiology for repeat drain check.  He is here today with his wife. He does report a history dating back to 2008 of intermittent attacks of left lower quadrant pain that will last for days on end.  This typically will improve with antibiotics. He reports he has been told since that time that he has had issues with diverticulitis. He states that when he actually had his laparoscopic appendectomy, his surgeon had told them that he had evidence of diverticulitis and his appendix was not overtly concerning in appearance for appendicitis. He still had an appendectomy carried out.  We have CT scans in our health system dating back to 2019 showing evidence of sigmoid diverticulitis. Initially these were uncomplicated.  He is currently scheduled for colonoscopy with Dr. Loreta Ave 01/26/2024  Currently, he has an indwelling percutaneous drain that has had minimal if any output. 1 or 2 mL will come out over the course of a few days. He is currently on gravity drainage. He denies any fever/chills/nausea/vomiting. He does have intermittent vague lower abdominal cramps and aches. He does report that he had been taking antibiotics up until about a week ago. He reports that anytime he comes off his antibiotics for a week or more, he ends to develop recurrent diverticulitis. He is therefore requested prescription for Augmentin which seems to keep his symptoms at bay and wishes to stay on this until surgery.  INTERVAL HX Colonosocpy completed with Dr. Loreta Ave last month - mild intrinsic traverse-able stenosis @ ~20 cm. Repeat planned in 10 yrs for screening purposes. He denies any changes in health or health history since we met in the office. No new medications/allergies. He states he is ready for surgery today.  PMH: HTN, GERD, BPH  PSH: Laparoscopic appendectomy [facility]); laparoscopic cholecystectomy (Dr. Magnus Ivan  FHx: Denies any known family history of colorectal, breast, endometrial or ovarian cancer  Social  Hx: Denies use of tobacco/EtOH/illicit drug. He is here today with his wife.  Past Medical History:  Diagnosis Date   Fatty liver    GERD (gastroesophageal reflux disease)    Hypertension     Pneumonia     Past Surgical History:  Procedure Laterality Date   APPENDECTOMY     CHOLECYSTECTOMY N/A 04/17/2021   Procedure: LAPAROSCOPIC CHOLECYSTECTOMY;  Surgeon: Abigail Miyamoto, MD;  Location: [facility];  Service: General;  Laterality: N/A;   COLONOSCOPY     FINGER SURGERY Left    ring and middle fingers reatttachment after amputation   IR CV LINE INJECTION  01/06/2024   IR RADIOLOGIST EVAL & MGMT  10/13/2023   IR RADIOLOGIST EVAL & MGMT  01/21/2024   WISDOM TOOTH EXTRACTION      History reviewed. No pertinent family history.  Social:  reports that he has never smoked. He has never used smokeless tobacco. He reports that he does not drink alcohol and does not use drugs.  Allergies: No Known Allergies  Medications: I have reviewed the patient's current medications.  No results found for this or any previous visit (from the past 48 hours).  No results found.   PE Blood pressure (!) 124/97, pulse 85, temperature 98.8 F (37.1 C), temperature source Oral, resp. rate 16, height 5\' 9"  (1.753 m), weight 77.6 kg, SpO2 97%. Constitutional: NAD; conversant Eyes: Moist conjunctiva; no lid lag; anicteric Lungs: Normal respiratory effort CV: RRR Psychiatric: Appropriate affect  No results found for this or any previous visit (from the past 48 hours).  No results found.  A/P: Henry Paul is an 58 y.o. male with hx of HTN, GERD, BPH here for evaluation of now smoldering/chronic diverticulitis with recent complicated attack with abscess now with indwelling percutaneous drain  - Colonoscopy is scheduled for 01/26/2024 with Dr. Loreta Ave - We will go ahead and prescribe him a course of Augmentin for 30 days with 1 refill for an additional 30 days to hopefully get him to surgery.  -The anatomy and physiology of the GI tract was reviewed with the patient. The pathophysiology of diverticulitis and diverticular abscesses was discussed as well with associated pictures.  -We have discussed  various different treatment options going forward including surgery (the most definitive) to address this -robotic assisted low anterior resection; flexible sigmoidoscopy; lysis of adhesions; ureteral ICG/firefly ([facility])  -The planned procedure, material risks (including, but not limited to, pain, bleeding, infection, scarring, need for blood transfusion, damage to surrounding structures- blood vessels/nerves/viscus/organs, damage to ureter, urine leak, leak from anastomosis, need for additional procedures, sexual dysfunction, low anterior resection syndrome (LARS) = increased fecal urgency and/or frequency, scenarios where a stoma may be necessary and where it may be permanent, worsening of pre-existing medical conditions, chronic diarrhea, constipation secondary to narcotic use, hernia, recurrence, pneumonia, heart attack, stroke, death) benefits and alternatives to surgery were discussed at length. The patient's questions were answered to he and his wife's satisfaction, they voiced understanding and elected to proceed with surgery. Additionally, we discussed typical postoperative expectations and the recovery process.   Marin Olp, MD [facility]

## 2024-02-11 NOTE — Transfer of Care (Signed)
 Immediate Anesthesia Transfer of Care Note  Patient: Henry Paul  Procedure(s) Performed: RESECTION, RECTUM, LOW ANTERIOR, ROBOT-ASSISTED SIGMOIDOSCOPY, FLEXIBLE LYSIS, ADHESIONS, LAPAROSCOPIC REPAIR, SMALL INTESTINE. COLON REPAIR Cystoscopy, retrograde injection of firefly bilateral  Patient Location: PACU  Anesthesia Type:General  Level of Consciousness: awake, alert , and oriented  Airway & Oxygen Therapy: Patient Spontanous Breathing and Patient connected to face mask oxygen  Post-op Assessment: Report given to RN and Post -op Vital signs reviewed and stable  Post vital signs: Reviewed and stable  Last Vitals:  Vitals Value Taken Time  BP    Temp    Pulse 92 02/11/24 1347  Resp 15 02/11/24 1347  SpO2 100 % 02/11/24 1347  Vitals shown include unfiled device data.  Last Pain:  Vitals:   02/11/24 0714  TempSrc: Oral  PainSc: 0-No pain         Complications: No notable events documented.

## 2024-02-11 NOTE — Anesthesia Procedure Notes (Signed)
 Procedure Name: Intubation Date/Time: 02/11/2024 9:36 AM  Performed by: Dyke Maes, RNPre-anesthesia Checklist: Patient identified, Emergency Drugs available, Suction available and Patient being monitored Patient Re-evaluated:Patient Re-evaluated prior to induction Oxygen Delivery Method: Circle system utilized Preoxygenation: Pre-oxygenation with 100% oxygen Induction Type: IV induction Ventilation: Mask ventilation without difficulty Laryngoscope Size: Mac and 4 Tube type: Oral Tube size: 7.5 mm Number of attempts: 1 Airway Equipment and Method: Stylet Placement Confirmation: ETT inserted through vocal cords under direct vision, positive ETCO2 and breath sounds checked- equal and bilateral Secured at: 23 cm Tube secured with: Tape Dental Injury: Teeth and Oropharynx as per pre-operative assessment

## 2024-02-11 NOTE — Op Note (Signed)
 PATIENT: Henry Paul  58 y.o. male  Patient Care Team: [name], MD as PCP - General (Internal Medicine) Karie Soda, MD as Consulting Physician (Colon and Rectal Surgery) Charna Elizabeth, MD as Consulting Physician (Gastroenterology) Abigail Miyamoto, MD as Consulting Physician (General Surgery) Tomma Lightning, MD as Consulting Physician (Pulmonary Disease) Tomma Lightning, MD as Consulting Physician (Pulmonary Disease)  PREOP DIAGNOSIS: CHRONIC DIVERTICULITIS  POSTOP DIAGNOSIS: CHRONIC DIVERTICULITIS  PROCEDURE:  Robotic assisted low anterior resection Small bowel resection Enterorrhaphy Colorrhaphy Lysis of adhesions x 90 minutes Intraoperative assessment of perfusion using ICG fluorescence imaging Flexible sigmoidoscopy Bilateral transversus abdominus plane (TAP) blocks  SURGEON: Stephanie Coup. Cliffton Asters, MD  ASSISTANT: Karie Soda, MD  An experienced assistant was required given the complexity of this procedure and the standard of surgical care. My assistant helped with exposure through counter tension, suctioning, ligation and retraction to better visualize the surgical field. My assistant expedited sewing during the case by following my sutures. Wherever I use the term "we" in the report, my assistant actively helped me with that portion of the procedure.   ANESTHESIA: General endotracheal  EBL: 150 mL Total I/O In: 1700 [I.V.:1600; IV Piggyback:100] Out: 375 [Urine:225; Blood:150]  DRAINS: None  SPECIMEN:  Rectosigmoid colon - open end proximal Segment of ileum  COUNTS: Sponge, needle and instrument counts were reported correct x2  FINDINGS: A well perfused, tension free, hemostatic, air tight 29 mm EEA colorectal anastomosis fashioned 13cm from the anal verge by flexible sigmoidoscopy.  NARRATIVE: Informed consent was verified. The patient was taken to the operating room, placed supine on the operating table and SCD's were applied. General  endotracheal anesthesia was induced without difficulty. He was then positioned in the lithotomy position with Allen stirrups.  Pressure points were evaluated and padded.  A foley catheter was then placed by nursing under sterile conditions. Hair on the abdomen was clipped.  He was secured to the operating table. Dr. Mena Goes with [facility] scrubbed for his portion of the procedure.  The abdomen was then prepped and draped in the standard sterile fashion. Surgical timeout was called indicating the correct patient, procedure, positioning and need for preoperative antibiotics.   An OG tube was placed by anesthesia and confirmed to be to suction.  At Palmer's point, a stab incision was created and the Veress needle was introduced into the peritoneal cavity on the first attempt.  Intraperitoneal location was confirmed by the aspiration and saline drop test.  Pneumoperitoneum was established to a maximum pressure of 15 mmHg using CO2.  Following this, the abdomen was marked for planned trocar sites.  Just to the right and cephalad to the umbilicus, an 8 mm incision was created and an 8 mm blunt tipped robotic trocar was cautiously placed into the peritoneal cavity.  The laparoscope was inserted and demonstrated no evidence of trocar site nor Veress needle site complications.  The Veress needle was removed.  Bilateral transversus abdominis plane blocks were then created using a dilute mixture of Exparel with Marcaine.  3 additional 8 mm robotic trochars were placed under direct visualization roughly in a line extending from the right ASIS towards the left upper quadrant. The bladder was inspected and noted to be at/below the pubic symphysis.  Staying 3 fingerbreadths above the pubic symphysis, an incision was created and the 12 mm robotic trocar inserted directed cephalad into the peritoneal cavity under direct visualization.  An additional 5 mm assist port was placed in the right lateral abdomen under direct  visualization.  The abdomen was surveyed and there was adhesions of the sigmoid colon in the left and right lower quadrant.  There are small bowel adhesions down here as well.  There are omental adhesions over the colon and to the abdominal wall.  The was positioned in Trendelenburg with the left side tilted slightly up.  Small bowel was carefully retracted out of the pelvis.  The robot was then docked and I went to the console.  We began with adhesiolysis. Adhesions consisting of omentum were taken down from abdominal wall first and this was done with the vessel sealer.  We then began with taking omental adhesions down from the right lower quadrant and the pelvis.  Adhesions of small bowel are carefully mobilized out of the right lower quadrant.  There is a dense interloop abscess with significant involvement of the neighboring loops of small bowel presumably related to a prior abscess.  The sigmoid colon was also densely adherent to this.  We therefore began with a lateral to medial approach.  The sigmoid colon was mobilized out of the pelvis.  The gonadals were identified and protected.  Medial to this, we were able to identify the left ureter which was also protected.  He says he mesocolon is mobilized medially.  The Charnay Nazario line of Toldt was incised all the way up to the splenic flexure.  The associated colon is mobilized medially.  Attention is then directed at the small bowel containing adhesions to the sigmoid colon the right lower quadrant.  Using scissors, this is carefully sharply dissected.  This is quite tedious.  We are ultimately able to separate the 2 structures.  The terminal ileum and cecum were also mobilized in an effort to allow this to reach down to the Pfannenstiel incision for further evaluation and inspection.  The sigmoid colon was then approached . The rectosigmoid colon was grasped and elevated anteriorly.  Beginning with a medial to lateral approach, the peritoneum overlying the  presacral space was carefully incised.  The TME plane was readily gained working in a plane between the fascia propria of the rectum and the presacral fascia.  Hypogastric nerves were seen going along the the presacral fascia and were protected free of injury.  Working more proximally, the mesorectum and sigmoid mesentery were carefully mobilized off of the peritoneum.  The left ureter was identified and protected free of injury.  The left gonadal vessels were identified and protected being seen from this approach as well.  These were both swept "down."  The superior hemorrhoidal and IMA pedicles were identified. Further mesocolon was mobilized proximally staying in this plane between the retroperitoneum proper and the mesocolon.   The rectosigmoid colon was elevated anteriorly. The left ureter was re-identified. The IMA was clear of this. The IMA was then divided with the vessel sealer. The stump was inspected and noted to be completely hemostatic with a good seal.  The mesentery was divided out to the point of planned proximal division using the vessel sealer.  Working more distally, the rectum was identified where the tinea had splayed and there were loss of appendices epiploica.  This also corresponded to a location overlying the sacral promontory.  Anatomically, this clearly represents the proximal rectum.  I was fibrosa this location therefore he went about a centimeter or 2 distal to this.  The mesentery out to this level was then cleared using the vessel sealer. The distal point of transection on the proximal rectum was identified.   A  60 mm green load robotic stapler was then placed through the 12 mm port and introduced into the peritoneal cavity.  The rectum was divided with a single firing of the stapler.  The stump was intact and healthy in appearance.   Attention was turned to performing a perfusion test. ICG was administered by anesthesia and at the level of the cleared mesentery proximally,  there was excellent uptake of the tracer.  The rectum was also well perfused in appearance.  There was a visible pulse in the mesentery out to the level of the cleared colon at the level of the proximal sigmoid/descending colon junction.  This colon is also supple and healthy in appearance without any thickening.  This reached into the pelvis without any difficulty and remained in that location without any tension. A locking grasper was then placed on the sigmoid staple line.  A locking grasper was also placed on the involved small bowel from the right lower quadrant.   Attention was turned to the extracorporeal portion of the procedure.  The robot was undocked.  I scrubbed back in.  Using the 12 mm trocar site, a Pfannenstiel incision was created and incorporated the fascial opening through the 12 mm port site.  The rectus fascia was incised and then elevated.  The rectus muscle was mobilized free of the overlying fascia.  The peritoneum was incised in the midline well above the location of the bladder.  An Alexis wound protector was placed.  Towels were placed around the field.  The divided colon was passed through the wound protector.  The point of proximal division was identified and was again on a healthy segment of supple colon with a palpable pulse in the mesentery. This was pink in color.  A pursestring device was applied.  A 2-0 Prolene on a Keith needle was passed.  The colon was divided and passed off with the open end being proximal.  EEA sizers were then introduced and a 29 mm EEA selected.  "Belt loops" consisting of 3-0 silk were placed around the pursestring suture line.  The anvil was placed and the pursestring tied.  A small amount of fat was cleared from the planned anastomosis and no diverticula were apparent within this.  Attention was then turned to the small bowel.  Additional adhesions between the loops of distal ileum were then identified and freed.  In doing all this, it became apparent  that one of the areas of small bowel was densely adherent to the mesentery and had what appears to be a full-thickness enterotomy that location.  This was felt to be inherent to the nature of the procedure as there was significant fibrosis and is clearly about an abscess cavity/contained perforation.  Given the quality of the tissues, plans were made to proceed with a small bowel resection.  This is in the level of the mid to distal ileum.  This is well clear of the ileocecal valve.  The cecum is inspected.  There is 1 area near where his appendix likely used to reside where there was questionable serosal splaying and this was repaired with a 3-0 silk Lembert stitch x 2.   The planned area of small bowel resection was reidentified.  Proximal distal ends were prepared by making windows in the associated mesentery.  This was then stapled using GIA blue load staplers.  The intervening mesentery was ligated and divided using the hand-held LigaSure device.  Cut edges inspected noted to be hemostatic.  The small bowel  was passed off as specimen.  Attention was directed to create any small bowel anastomosis.  The corners of each respective antimesenteric staple line were then trimmed.  Orientation is confirmed.  A 75 mm GIA blue stapler was used to create a small bowel anastomosis at the level of the distal ileum.  Again, this is clearly ileocecal valve.  The staple line is inspected noted to have well-formed staples which are hemostatic.  The small bowel, enterotomy was then elevated with Allis clamps and closed using a TA 90 blue stapler.  The corners of the TA staple line are dunked using 3-0 silk suture.  The mesenteric defect was then obliterated using figure-of-eight 3-0 silk sutures.  The anastomosis is palpated and found to be widely patent, 3 fingerbreadths in diameter.  The small bowel was then run.  We identified 1 other area proximal to the anastomosis where there was questionable serosal splaying versus  chronic groin from the abscess.  That said, we opted to proceed with a small bowel repair at this location.  That was done using 3-0 silk Lembert sutures.  All repairs were palpated and found to remain with widely patent loops of small bowel.  The small bowel was then run a second time and no additional areas of concern identified.  All segments are well-perfused and pink in color.  This was placed back into the abdomen and a cap placed over the wound protector port site.  Pneumoperitoneum was reestablished.  I then went below to pass the stapler.  My partner remained above.  EEA sizers were cautiously introduced via the anus and advanced under direct visualization.  The stapler was passed and the spike deployed just anterior to the staple line.  The components were then mated.  Orientation was confirmed such that there is no twisting of the colon nor small bowel underneath the mesenteric defect. Care was taken to ensure no other structures were incorporated within this either.  The stapler was then closed, held, and fired. This was then removed. The donuts were inspected and noted to be complete.  The colon proximal to the anastomosis was then gently occluded. The pelvis was filled with sterile irrigation. Under direct visualization, I passed a flexible sigmoidoscope.  The anastomosis was under water.  With good distention of the anastomosis there was no air leak. The anastomosis pink in appearance.  This is located at 13 cm from the anal verge by flexible sigmoidoscopy.  It is hemostatic.  Additionally, looking from above, there is no tension on the colon or mesentery.  Sigmoidoscope was withdrawn.  Irrigation was evacuated from the pelvis.  The abdomen and pelvis are surveyed and noted to be completely hemostatic without any apparent injury.  Under direct visualization, all trochars are removed.  The Alexis wound protector was removed.  Gowns/gloves are changed and a fresh set of clean instruments utilized.  Additional sterile drapes were placed around the field.   Bladder was inspected and found to be well away from our extraction site.  There is also no evident fistula to the bladder or area of concern on the dome of the bladder.  The Pfannenstiel peritoneum was closed with a running 2-0 Vicryl suture.  The rectus fascia was then closed using 2 running #1 PDS sutures.  The fascia was then palpated and noted to be completely closed.  Additional anesthetic was infiltrated at the Pfannenstiel site.  Sponge, needle, and instrument counts were reported correct x2. 4-0 Monocryl subcuticular suture was used to close  the skin of all incision sites.  Dermabond was placed over all incisions.  A honeycomb dressing placed over the Pfannenstiel as well.   He was then taken out of lithotomy, awakened from anesthesia, extubated, and transferred to a stretcher for transport to PACU in satisfactory condition having tolerated the procedure well.

## 2024-02-12 ENCOUNTER — Encounter (HOSPITAL_COMMUNITY): Payer: Self-pay | Admitting: Surgery

## 2024-02-12 LAB — BASIC METABOLIC PANEL WITH GFR
Anion gap: 8 (ref 5–15)
BUN: 13 mg/dL (ref 6–20)
CO2: 25 mmol/L (ref 22–32)
Calcium: 8.9 mg/dL (ref 8.9–10.3)
Chloride: 101 mmol/L (ref 98–111)
Creatinine, Ser: 1.06 mg/dL (ref 0.61–1.24)
GFR, Estimated: >60 mL/min (ref >60)
Glucose, Bld: 106 mg/dL — ABNORMAL HIGH (ref 70–99)
Potassium: 4.1 mmol/L (ref 3.5–5.1)
Sodium: 134 mmol/L — ABNORMAL LOW (ref 135–145)

## 2024-02-12 LAB — CBC
HCT: 35.8 % — ABNORMAL LOW (ref 39.0–52.0)
Hemoglobin: 11.3 g/dL — ABNORMAL LOW (ref 13.0–17.0)
MCH: 29 pg (ref 26.0–34.0)
MCHC: 31.6 g/dL (ref 30.0–36.0)
MCV: 92 fL (ref 80.0–100.0)
Platelets: 349 10*3/uL (ref 150–400)
RBC: 3.89 MIL/uL — ABNORMAL LOW (ref 4.22–5.81)
RDW: 14.4 % (ref 11.5–15.5)
WBC: 11.9 10*3/uL — ABNORMAL HIGH (ref 4.0–10.5)
nRBC: 0 % (ref 0.0–0.2)

## 2024-02-12 NOTE — Plan of Care (Signed)

## 2024-02-12 NOTE — Progress Notes (Signed)
 1 Day Post-Op   Subjective/Chief Complaint: No complaints other than some soreness   Objective: Vital signs in last 24 hours: Temp:  [97.8 F (36.6 C)-98.5 F (36.9 C)] 98.3 F (36.8 C) (04/12 0459) Pulse Rate:  [78-101] 78 (04/12 0459) Resp:  [15-19] 16 (04/12 0459) BP: (100-125)/(67-86) 103/76 (04/12 0459) SpO2:  [97 %-100 %] 99 % (04/12 0459) Weight:  [78.6 kg] 78.6 kg (04/12 0456) Last BM Date : 02/11/24  Intake/Output from previous day: 04/11 0701 - 04/12 0700 In: 3500.2 [P.O.:600; I.V.:2800.2; IV Piggyback:100] Out: 1700 [Urine:1550; Blood:150] Intake/Output this shift: No intake/output data recorded.  General appearance: alert and cooperative Resp: clear to auscultation bilaterally Cardio: regular rate and rhythm GI: soft, mild tenderness. Incisions look good. Good bs  Lab Results:  Recent Labs    02/12/24 0517  WBC 11.9*  HGB 11.3*  HCT 35.8*  PLT 349   BMET Recent Labs    02/12/24 0517  NA 134*  K 4.1  CL 101  CO2 25  GLUCOSE 106*  BUN 13  CREATININE 1.06  CALCIUM 8.9   PT/INR No results for input(s): "LABPROT", "INR" in the last 72 hours. ABG No results for input(s): "PHART", "HCO3" in the last 72 hours.  Invalid input(s): "PCO2", "PO2"  Studies/Results: No results found.  Anti-infectives: Anti-infectives (From admission, onward)    Start     Dose/Rate Route Frequency Ordered Stop   02/11/24 0715  cefoTEtan (CEFOTAN) 2 g in sodium chloride 0.9 % 100 mL IVPB        2 g 200 mL/hr over 30 Minutes Intravenous On call to O.R. 02/11/24 0709 02/11/24 2028       Assessment/Plan: s/p Procedure(s) with comments: RESECTION, RECTUM, LOW ANTERIOR, ROBOT-ASSISTED (N/A) - ROBOTIC LOW ANTERIOR RESECTION 1. Robotic assisted low anterior resection 2. Small bowel resection 3. Enterorrhaphy 4. Colorrhaphy 5. Lysis of adhesions x 90 minutes 6. Intraoperative assessment of perfusion using ICG fluorescence imaging 7. Flexible sigmoidoscopy 8. Bilateral  transversus abdominus plane (TAP) blocks (N/A) LYSIS, ADHESIONS, LAPAROSCOPIC (N/A) Cystoscopy, retrograde injection of firefly bilateral (N/A) - CYSTO WITH FIREFLY REPAIR, SMALL INTESTINE. COLON REPAIR Advance diet D/c foley POD 1 Ambulate lovenox  LOS: 1 day    Henry Paul 02/12/2024

## 2024-02-13 LAB — CBC
HCT: 30.9 % — ABNORMAL LOW (ref 39.0–52.0)
Hemoglobin: 10 g/dL — ABNORMAL LOW (ref 13.0–17.0)
MCH: 29.5 pg (ref 26.0–34.0)
MCHC: 32.4 g/dL (ref 30.0–36.0)
MCV: 91.2 fL (ref 80.0–100.0)
Platelets: 236 K/uL (ref 150–400)
RBC: 3.39 MIL/uL — ABNORMAL LOW (ref 4.22–5.81)
RDW: 14.6 % (ref 11.5–15.5)
WBC: 9.3 K/uL (ref 4.0–10.5)
nRBC: 0 % (ref 0.0–0.2)

## 2024-02-13 LAB — BASIC METABOLIC PANEL WITH GFR
Anion gap: 7 (ref 5–15)
BUN: 8 mg/dL (ref 6–20)
CO2: 23 mmol/L (ref 22–32)
Calcium: 8.6 mg/dL — ABNORMAL LOW (ref 8.9–10.3)
Chloride: 105 mmol/L (ref 98–111)
Creatinine, Ser: 0.89 mg/dL (ref 0.61–1.24)
GFR, Estimated: >60 mL/min
Glucose, Bld: 96 mg/dL (ref 70–99)
Potassium: 4 mmol/L (ref 3.5–5.1)
Sodium: 135 mmol/L (ref 135–145)

## 2024-02-13 MED ORDER — METHOCARBAMOL 500 MG PO TABS
500.0000 mg | ORAL_TABLET | Freq: Three times a day (TID) | ORAL | 1 refills | Status: AC | PRN
Start: 2024-02-13 — End: ?

## 2024-02-13 NOTE — Progress Notes (Signed)
   02/13/24 0923  TOC Brief Assessment  Insurance and Status Reviewed  Patient has primary care physician Yes  Home environment has been reviewed home w/ spouse  Prior level of function: independent  Prior/Current Home Services No current home services  Social Drivers of Health Review SDOH reviewed no interventions necessary  Readmission risk has been reviewed Yes  Transition of care needs no transition of care needs at this time

## 2024-02-13 NOTE — Progress Notes (Signed)
 Reviewed written d/c instructions w pt and all questions answered, he verbalized understanding. D/C via w/c w all belongings in stable condition.

## 2024-02-13 NOTE — Plan of Care (Signed)

## 2024-02-13 NOTE — Discharge Summary (Signed)
 Physician Discharge Summary    Henry Paul MRN: [id number] DOB/AGE: 1966/02/15 = 58 y.o.  Patient Care Team: Jearldine Mina, MD as PCP - General (Internal Medicine) Candyce Champagne, MD as Consulting Physician (Colon and Rectal Surgery) Tami Falcon, MD as Consulting Physician (Gastroenterology) Oza Blumenthal, MD as Consulting Physician (General Surgery) Margaretann Sharper, MD as Consulting Physician (Pulmonary Disease) Margaretann Sharper, MD as Consulting Physician (Pulmonary Disease)  Admit date: 02/11/2024  Discharge date: 02/13/2024  Hospital Stay = 2 days    Discharge Diagnoses:  Principal Problem:   S/P robot-assisted surgical procedure   2 Days Post-Op  02/11/2024  POST-OPERATIVE DIAGNOSIS:   CHRONIC DIVERTICULITIS  SURGERY:  02/11/2024  Procedure(s): RESECTION, RECTUM, LOW ANTERIOR, ROBOT-ASSISTED 1. Robotic assisted low anterior resection 2. Small bowel resection 3. Enterorrhaphy 4. Colorrhaphy 5. Lysis of adhesions x 90 minutes 6. Intraoperative assessment of perfusion using ICG fluorescence imaging 7. Flexible sigmoidoscopy 8. Bilateral transversus abdominus plane (TAP) blocks LYSIS, ADHESIONS, LAPAROSCOPIC Cystoscopy, retrograde injection of firefly bilateral REPAIR, SMALL INTESTINE. COLON REPAIR  SURGEON:    Surgeon(s): Melvenia Stabs, MD Candyce Champagne, MD Christina Coyer, MD  Consults: Anesthesia  Hospital Course:   The patient underwent the surgery above.  Postoperatively, the patient gradually mobilized and advanced to a solid diet.  Pain and other symptoms were treated aggressively.    By the time of discharge, the patient was walking well the hallways, eating food, having flatus.  Pain was well-controlled on an oral medications.  Based on meeting discharge criteria and continuing to recover, I felt it was safe for the patient to be discharged from the hospital to further recover with close followup. Postoperative recommendations were  discussed in detail.  They are written as well.  Discharged Condition: good  Discharge Exam: Blood pressure 106/72, pulse 80, temperature 98.1 F (36.7 C), temperature source Oral, resp. rate 16, height 5\' 9"  (1.753 m), weight 78.5 kg, SpO2 96%.  General: Pt awake/alert/oriented x4 in No acute distress Eyes: PERRL, normal EOM.  Sclera clear.  No icterus Neuro: CN II-XII intact w/o focal sensory/motor deficits. Lymph: No head/neck/groin lymphadenopathy Psych:  No delerium/psychosis/paranoia HENT: Normocephalic, Mucus membranes moist.  No thrush Neck: Supple, No tracheal deviation Chest: No chest wall pain w good excursion CV:  Pulses intact.  Regular rhythm MS: Normal AROM mjr joints.  No obvious deformity Abdomen: Soft.  Nondistended.  Mildly tender at incisions only.  No evidence of peritonitis.  No incarcerated hernias. Ext:  SCDs BLE.  No mjr edema.  No cyanosis Skin: No petechiae / purpura   Disposition:    Follow-up Information     Melvenia Stabs, MD Follow up on 03/06/2024.   Specialties: General Surgery, Colon and Rectal Surgery Why: Please arrive by 1:40 pm Contact information: [address] [place] [address] [phone]                 Discharge disposition: 01-Home or Self Care       Discharge Instructions     Call MD for:   Complete by: As directed    FEVER > 101.5 F  (temperatures < 101.5 F are not significant)   Call MD for:  extreme fatigue   Complete by: As directed    Call MD for:  persistant dizziness or light-headedness   Complete by: As directed    Call MD for:  persistant nausea and vomiting   Complete by: As directed    Call MD for:  redness,  tenderness, or signs of infection (pain, swelling, redness, odor or green/yellow discharge around incision site)   Complete by: As directed    Call MD for:  severe uncontrolled pain   Complete by: As directed    Diet - low sodium heart healthy   Complete by: As  directed    Start with a bland diet such as soups, liquids, starchy foods, low fat foods, etc. the first few days at home. Gradually advance to a solid, low-fat, high fiber diet by the end of the first week at home.   Add a fiber supplement to your diet (Metamucil, etc) If you feel full, bloated, or constipated, stay on a full liquid or pureed/blenderized diet for a few days until you feel better and are no longer constipated.   Discharge instructions   Complete by: As directed    See Discharge Instructions If you are not getting better after two weeks or are noticing you are getting worse, contact our office [phone] for further advice.  We may need to adjust your medications, re-evaluate you in the office, send you to the emergency room, or see what other things we can do to help. The clinic staff is available to answer your questions during regular business hours (8:30am-5pm).  Please don't hesitate to call and ask to speak to one of our nurses for clinical concerns.    A surgeon from [facility] is always on call at the hospitals 24 hours/day If you have a medical emergency, go to the nearest emergency room or call 911.   Discharge wound care:   Complete by: As directed    It is good for closed incisions and even open wounds to be washed every day.  Shower every day.  Short baths are fine.  Wash the incisions and wounds clean with soap & water.    You may leave closed incisions open to air if it is dry.   You may cover the incision with clean gauze & replace it after your daily shower for comfort.  DERMABOND:  You have purple skin glue (Dermabond) on your incision(s).  Leave them in place, and they will fall off on their own like a scab in 2-3 weeks.  You may trim any edges that curl up with clean scissors.   Driving Restrictions   Complete by: As directed    You may drive when: - you are no longer taking narcotic prescription pain medication - you can comfortably wear a  seatbelt - you can safely make sudden turns/stops without pain.   Increase activity slowly   Complete by: As directed    Start light daily activities --- self-care, walking, climbing stairs- beginning the day after surgery.  Gradually increase activities as tolerated.  Control your pain to be active.  Stop when you are tired.  Ideally, walk several times a day, eventually an hour a day.   Most people are back to most day-to-day activities in a few weeks.  It takes 4-6 weeks to get back to unrestricted, intense activity. If you can walk 30 minutes without difficulty, it is safe to try more intense activity such as jogging, treadmill, bicycling, low-impact aerobics, swimming, etc. Save the most intensive and strenuous activity for last (Usually 4-8 weeks after surgery) such as sit-ups, heavy lifting, contact sports, etc.  Refrain from any intense heavy lifting or straining until you are off narcotics for pain control.  You will have off days, but things should improve week-by-week. DO NOT  PUSH THROUGH PAIN.  Let pain be your guide: If it hurts to do something, don't do it.   Lifting restrictions   Complete by: As directed    If you can walk 30 minutes without difficulty, it is safe to try more intense activity such as jogging, treadmill, bicycling, low-impact aerobics, swimming, etc. Save the most intensive and strenuous activity for last (Usually 4-8 weeks after surgery) such as sit-ups, heavy lifting, contact sports, etc.   Refrain from any intense heavy lifting or straining until you are off narcotics for pain control.  You will have off days, but things should improve week-by-week. DO NOT PUSH THROUGH PAIN.  Let pain be your guide: If it hurts to do something, don't do it.  Pain is your body warning you to avoid that activity for another week until the pain goes down.   May shower / Bathe   Complete by: As directed    May walk up steps   Complete by: As directed    Sexual Activity Restrictions    Complete by: As directed    You may have sexual intercourse when it is comfortable. If it hurts to do something, stop.       Allergies as of 02/13/2024   No Known Allergies      Medication List     STOP taking these medications    amoxicillin-clavulanate 875-125 MG tablet Commonly known as: AUGMENTIN   docusate sodium 100 MG capsule Commonly known as: COLACE   Normal Saline Flush 0.9 % Soln       TAKE these medications    acetaminophen 500 MG tablet Commonly known as: TYLENOL Take 1,000 mg by mouth every 6 (six) hours as needed for mild pain (pain score 1-3) or moderate pain (pain score 4-6).   cetirizine 10 MG tablet Commonly known as: ZYRTEC Take 10 mg by mouth at bedtime.   diphenhydramine-acetaminophen 25-500 MG Tabs tablet Commonly known as: TYLENOL PM Take 2 tablets by mouth at bedtime.   methocarbamol 500 MG tablet Commonly known as: ROBAXIN Take 1 tablet (500 mg total) by mouth every 8 (eight) hours as needed for muscle spasms.   metoprolol tartrate 25 MG tablet Commonly known as: LOPRESSOR Take 1 tablet (25 mg total) by mouth 2 (two) times daily.   pantoprazole 20 MG tablet Commonly known as: PROTONIX Take 20 mg by mouth at bedtime.   tamsulosin 0.4 MG Caps capsule Commonly known as: FLOMAX Take 0.4 mg by mouth at bedtime.   traMADol 50 MG tablet Commonly known as: Ultram Take 1 tablet (50 mg total) by mouth every 6 (six) hours as needed for up to 5 days (postop pain not controlled with tylenol and ibuprofen first).               Discharge Care Instructions  (From admission, onward)           Start     Ordered   02/13/24 0000  Discharge wound care:       Comments: It is good for closed incisions and even open wounds to be washed every day.  Shower every day.  Short baths are fine.  Wash the incisions and wounds clean with soap & water.    You may leave closed incisions open to air if it is dry.   You may cover the incision with  clean gauze & replace it after your daily shower for comfort.  DERMABOND:  You have purple skin glue (Dermabond) on your incision(s).  Leave them in place, and they will fall off on their own like a scab in 2-3 weeks.  You may trim any edges that curl up with clean scissors.   02/13/24 1026            Significant Diagnostic Studies:  Results for orders placed or performed during the hospital encounter of 02/11/24 (from the past 72 hours)  CBC     Status: Abnormal   Collection Time: 02/12/24  5:17 AM  Result Value Ref Range   WBC 11.9 (H) 4.0 - 10.5 K/uL   RBC 3.89 (L) 4.22 - 5.81 MIL/uL   Hemoglobin 11.3 (L) 13.0 - 17.0 g/dL   HCT 81.1 (L) 91.4 - 78.2 %   MCV 92.0 80.0 - 100.0 fL   MCH 29.0 26.0 - 34.0 pg   MCHC 31.6 30.0 - 36.0 g/dL   RDW 95.6 21.3 - 08.6 %   Platelets 349 150 - 400 K/uL   nRBC 0.0 0.0 - 0.2 %    Comment: Performed at [facility], [address] [place], [place] [address]  Basic metabolic panel     Status: Abnormal   Collection Time: 02/12/24  5:17 AM  Result Value Ref Range   Sodium 134 (L) 135 - 145 mmol/L   Potassium 4.1 3.5 - 5.1 mmol/L   Chloride 101 98 - 111 mmol/L   CO2 25 22 - 32 mmol/L   Glucose, Bld 106 (H) 70 - 99 mg/dL    Comment: Glucose reference range applies only to samples taken after fasting for at least 8 hours.   BUN 13 6 - 20 mg/dL   Creatinine, Ser 9.62 0.61 - 1.24 mg/dL   Calcium 8.9 8.9 - 95.2 mg/dL   GFR, Estimated >84 >13 mL/min    Comment: (NOTE) Calculated using the CKD-EPI Creatinine Equation (2021)    Anion gap 8 5 - 15    Comment: Performed at [facility], [address] [place], [place] [address]  CBC     Status: Abnormal   Collection Time: 02/13/24  5:29 AM  Result Value Ref Range   WBC 9.3 4.0 - 10.5 K/uL   RBC 3.39 (L) 4.22 - 5.81 MIL/uL   Hemoglobin 10.0 (L) 13.0 - 17.0 g/dL   HCT 02.7 (L) 25.3 - 66.4 %   MCV 91.2 80.0 - 100.0 fL   MCH 29.5 26.0 - 34.0 pg   MCHC 32.4  30.0 - 36.0 g/dL   RDW 40.3 47.4 - 25.9 %   Platelets 236 150 - 400 K/uL   nRBC 0.0 0.0 - 0.2 %    Comment: Performed at [facility], [address] [place], [place] [address]  Basic metabolic panel     Status: Abnormal   Collection Time: 02/13/24  5:29 AM  Result Value Ref Range   Sodium 135 135 - 145 mmol/L   Potassium 4.0 3.5 - 5.1 mmol/L   Chloride 105 98 - 111 mmol/L   CO2 23 22 - 32 mmol/L   Glucose, Bld 96 70 - 99 mg/dL    Comment: Glucose reference range applies only to samples taken after fasting for at least 8 hours.   BUN 8 6 - 20 mg/dL   Creatinine, Ser 5.64 0.61 - 1.24 mg/dL   Calcium 8.6 (L) 8.9 - 10.3 mg/dL   GFR, Estimated >33 >29 mL/min    Comment: (NOTE) Calculated using the CKD-EPI Creatinine Equation (2021)    Anion gap 7 5 -  15    Comment: Performed at [facility], [address], [place], [place] [address]    No results found.  Past Medical History:  Diagnosis Date   Fatty liver    GERD (gastroesophageal reflux disease)    Hypertension    Pneumonia     Past Surgical History:  Procedure Laterality Date   APPENDECTOMY     CHOLECYSTECTOMY N/A 04/17/2021   Procedure: LAPAROSCOPIC CHOLECYSTECTOMY;  Surgeon: Oza Blumenthal, MD;  Location: [facility];  Service: General;  Laterality: N/A;   COLONOSCOPY     CYSTOSCOPY N/A 02/11/2024   Procedure: Cystoscopy, retrograde injection of firefly bilateral;  Surgeon: Christina Coyer, MD;  Location: [facility];  Service: Urology;  Laterality: N/A;  CYSTO WITH FIREFLY   FINGER SURGERY Left    ring and middle fingers reatttachment after amputation   FLEXIBLE SIGMOIDOSCOPY N/A 02/11/2024   Procedure: 1. Robotic assisted low anterior resection 2. Small bowel resection 3. Enterorrhaphy 4. Colorrhaphy 5. Lysis of adhesions x 90 minutes 6. Intraoperative assessment of perfusion using ICG fluorescence imaging 7. Flexible sigmoidoscopy 8. Bilateral transversus abdominus plane (TAP) blocks;   Surgeon: Melvenia Stabs, MD;  Location: [facility];  Service: General;  Laterality: N/A;   IR CV LINE INJECTION  01/06/2024   IR RADIOLOGIST EVAL & MGMT  10/13/2023   IR RADIOLOGIST EVAL & MGMT  01/21/2024   LAPAROSCOPIC LYSIS OF ADHESIONS N/A 02/11/2024   Procedure: LYSIS, ADHESIONS, LAPAROSCOPIC;  Surgeon: Melvenia Stabs, MD;  Location: [facility];  Service: General;  Laterality: N/A;   SMALL BOWEL REPAIR  02/11/2024   Procedure: REPAIR, SMALL INTESTINE. COLON REPAIR;  Surgeon: Melvenia Stabs, MD;  Location: [facility];  Service: General;;   WISDOM TOOTH EXTRACTION     XI ROBOTIC ASSISTED LOWER ANTERIOR RESECTION N/A 02/11/2024   Procedure: RESECTION, RECTUM, LOW ANTERIOR, ROBOT-ASSISTED;  Surgeon: Melvenia Stabs, MD;  Location: [facility];  Service: General;  Laterality: N/A;  ROBOTIC LOW ANTERIOR RESECTION    Social History   Socioeconomic History   Marital status: Married    Spouse name: Not on file   Number of children: Not on file   Years of education: Not on file   Highest education level: Not on file  Occupational History   Not on file  Tobacco Use   Smoking status: Never   Smokeless tobacco: Never  Vaping Use   Vaping status: Never Used  Substance and Sexual Activity   Alcohol use: No   Drug use: No   Sexual activity: Yes  Other Topics Concern   Not on file  Social History Narrative   Works in Holiday representative   Social Drivers of Health   Financial Resource Strain: Not on file  Food Insecurity: No Food Insecurity (02/11/2024)   Hunger Vital Sign    Worried About Running Out of Food in the Last Year: Never true    Ran Out of Food in the Last Year: Never true  Transportation Needs: No Transportation Needs (02/11/2024)   PRAPARE - Administrator, Civil Service (Medical): No    Lack of Transportation (Non-Medical): No  Physical Activity: Not on file  Stress: Not on file  Social Connections: Moderately Integrated (02/11/2024)   Social Connection and  Isolation Panel [NHANES]    Frequency of Communication with Friends and Family: More than three times a week    Frequency of Social Gatherings with Friends and Family: More than three times a week    Attends Religious  Services: More than 4 times per year    Active Member of Clubs or Organizations: No    Attends Banker Meetings: Never    Marital Status: Married  Catering manager Violence: Not At Risk (02/11/2024)   Humiliation, Afraid, Rape, and Kick questionnaire    Fear of Current or Ex-Partner: No    Emotionally Abused: No    Physically Abused: No    Sexually Abused: No    History reviewed. No pertinent family history.  Current Facility-Administered Medications  Medication Dose Route Frequency Provider Last Rate Last Admin   acetaminophen (TYLENOL) tablet 1,000 mg  1,000 mg Oral Q6H Melvenia Stabs, MD   1,000 mg at 02/13/24 0557   alum & mag hydroxide-simeth (MAALOX/MYLANTA) 200-200-20 MG/5ML suspension 30 mL  30 mL Oral Q6H PRN Melvenia Stabs, MD       alvimopan (ENTEREG) capsule 12 mg  12 mg Oral BID Melvenia Stabs, MD   12 mg at 02/12/24 0955   diphenhydrAMINE (BENADRYL) 12.5 MG/5ML elixir 12.5 mg  12.5 mg Oral Q6H PRN Melvenia Stabs, MD   12.5 mg at 02/12/24 2132   Or   diphenhydrAMINE (BENADRYL) injection 12.5 mg  12.5 mg Intravenous Q6H PRN Melvenia Stabs, MD       feeding supplement (ENSURE SURGERY) liquid 237 mL  237 mL Oral BID BM Melvenia Stabs, MD   237 mL at 02/12/24 1542   heparin injection 5,000 Units  5,000 Units Subcutaneous Q8H Melvenia Stabs, MD   5,000 Units at 02/13/24 0602   hydrALAZINE (APRESOLINE) injection 10 mg  10 mg Intravenous Q2H PRN Melvenia Stabs, MD       HYDROmorphone (DILAUDID) injection 0.5 mg  0.5 mg Intravenous Q3H PRN Melvenia Stabs, MD   0.5 mg at 02/12/24 1829   ibuprofen (ADVIL) tablet 600 mg  600 mg Oral Q6H PRN Melvenia Stabs, MD   600 mg at 02/12/24 1808   lactated  ringers infusion   Intravenous Continuous Melvenia Stabs, MD 75 mL/hr at 02/13/24 0223 Infusion Verify at 02/13/24 0223   loratadine (CLARITIN) tablet 10 mg  10 mg Oral Daily Melvenia Stabs, MD   10 mg at 02/13/24 0924   metoprolol tartrate (LOPRESSOR) tablet 25 mg  25 mg Oral BID Melvenia Stabs, MD   25 mg at 02/13/24 0924   ondansetron (ZOFRAN) tablet 4 mg  4 mg Oral Q6H PRN Melvenia Stabs, MD       Or   ondansetron (ZOFRAN) injection 4 mg  4 mg Intravenous Q6H PRN Melvenia Stabs, MD   4 mg at 02/11/24 2143   pantoprazole (PROTONIX) EC tablet 20 mg  20 mg Oral QHS Melvenia Stabs, MD   20 mg at 02/12/24 2131   simethicone (MYLICON) chewable tablet 40 mg  40 mg Oral Q6H PRN Melvenia Stabs, MD       tamsulosin (FLOMAX) capsule 0.4 mg  0.4 mg Oral QHS Melvenia Stabs, MD   0.4 mg at 02/12/24 2132   traMADol (ULTRAM) tablet 50 mg  50 mg Oral Q6H PRN Melvenia Stabs, MD   50 mg at 02/12/24 2132     No Known Allergies  Signed:   Eddye Goodie, MD, FACS, MASCRS Esophageal, Gastrointestinal & Colorectal Surgery Robotic and Minimally Invasive Surgery  [facility] [address], [address] [place], [place] [address] [phone] Fax [phone] Main  CONTACT INFORMATION: Weekday (9AM-5PM): Call [facility] main office at [phone] Weeknight (5PM-9AM) or Weekend/Holiday: Check EPIC "Web Links" tab & use "AMION" (password " [email or web address]") for General Surgery [facility] coverage  Please, DO NOT use SecureChat  (it is not reliable communication to reach operating surgeons & will lead to a delay in care).   Epic staff messaging available for outptient concerns needing 1-2 business day response.      02/13/2024, 10:26 AM

## 2024-02-13 NOTE — Plan of Care (Signed)

## 2024-02-16 LAB — SURGICAL PATHOLOGY

## 2024-03-01 ENCOUNTER — Ambulatory Visit (HOSPITAL_COMMUNITY)
Admission: RE | Admit: 2024-03-01 | Discharge: 2024-03-01 | Disposition: A | Discharge status: Home / Self Care | Source: Ambulatory Visit | Attending: Student | Admitting: Student

## 2024-03-01 ENCOUNTER — Other Ambulatory Visit (HOSPITAL_COMMUNITY): Payer: Self-pay | Admitting: Student

## 2024-03-01 DIAGNOSIS — R1031 Right lower quadrant pain: Secondary | ICD-10-CM | POA: Diagnosis present

## 2024-03-01 MED ORDER — IOHEXOL 9 MG/ML PO SOLN
1000.0000 mL | Freq: Once | ORAL | Status: AC
  Administered 2024-03-01: 1000 mL via ORAL

## 2024-03-01 MED ORDER — IOHEXOL 300 MG/ML  SOLN
100.0000 mL | Freq: Once | INTRAMUSCULAR | Status: AC | PRN
  Administered 2024-03-01: 100 mL via INTRAVENOUS

## 2024-03-01 MED ORDER — IOHEXOL 9 MG/ML PO SOLN
ORAL | Status: AC
  Filled 2024-03-01: qty 1000

## 2024-06-15 ENCOUNTER — Other Ambulatory Visit (HOSPITAL_COMMUNITY): Payer: Self-pay

## 2024-07-26 ENCOUNTER — Other Ambulatory Visit (HOSPITAL_COMMUNITY): Payer: Self-pay
